# Patient Record
Sex: Male | Born: 1955
Health system: Southern US, Community
[De-identification: ages and names within clinical notes are randomized; demographics above are authoritative.]

## PROBLEM LIST (undated history)

## (undated) DIAGNOSIS — G4730 Sleep apnea, unspecified: Secondary | ICD-10-CM

## (undated) DIAGNOSIS — I10 Essential (primary) hypertension: Secondary | ICD-10-CM

## (undated) DIAGNOSIS — C801 Malignant (primary) neoplasm, unspecified: Secondary | ICD-10-CM

## (undated) DIAGNOSIS — E291 Testicular hypofunction: Secondary | ICD-10-CM

## (undated) DIAGNOSIS — G4733 Obstructive sleep apnea (adult) (pediatric): Secondary | ICD-10-CM

## (undated) DIAGNOSIS — E785 Hyperlipidemia, unspecified: Secondary | ICD-10-CM

## (undated) DIAGNOSIS — F988 Other specified behavioral and emotional disorders with onset usually occurring in childhood and adolescence: Secondary | ICD-10-CM

## (undated) DIAGNOSIS — M5412 Radiculopathy, cervical region: Secondary | ICD-10-CM

## (undated) DIAGNOSIS — R011 Cardiac murmur, unspecified: Secondary | ICD-10-CM

## (undated) DIAGNOSIS — T7840XA Allergy, unspecified, initial encounter: Secondary | ICD-10-CM

## (undated) DIAGNOSIS — M5481 Occipital neuralgia: Secondary | ICD-10-CM

## (undated) DIAGNOSIS — R7302 Impaired glucose tolerance (oral): Secondary | ICD-10-CM

## (undated) HISTORY — DX: Hyperlipidemia, unspecified: E78.5

## (undated) HISTORY — DX: Sleep apnea, unspecified: G47.30

## (undated) HISTORY — DX: Cardiac murmur, unspecified: R01.1

## (undated) HISTORY — DX: Essential (primary) hypertension: I10

## (undated) HISTORY — DX: Allergy, unspecified, initial encounter: T78.40XA

## (undated) HISTORY — DX: Radiculopathy, cervical region: M54.12

## (undated) HISTORY — PX: COLONOSCOPY: SHX174

## (undated) HISTORY — DX: Obstructive sleep apnea (adult) (pediatric): G47.33

## (undated) HISTORY — PX: UVULOPALATOPLASTY: SHX2633

## (undated) HISTORY — PX: POLYPECTOMY: SHX149

## (undated) HISTORY — DX: Other specified behavioral and emotional disorders with onset usually occurring in childhood and adolescence: F98.8

## (undated) HISTORY — DX: Testicular hypofunction: E29.1

## (undated) HISTORY — DX: Impaired glucose tolerance (oral): R73.02

## (undated) HISTORY — DX: Occipital neuralgia: M54.81

## (undated) HISTORY — DX: Malignant (primary) neoplasm, unspecified: C80.1

---

## 1998-03-10 ENCOUNTER — Ambulatory Visit: Admission: RE | Admit: 1998-03-10 | Discharge: 1998-03-10 | Payer: Self-pay | Admitting: Unknown Physician Specialty

## 1998-05-18 ENCOUNTER — Ambulatory Visit (HOSPITAL_BASED_OUTPATIENT_CLINIC_OR_DEPARTMENT_OTHER): Admission: RE | Admit: 1998-05-18 | Discharge: 1998-05-18 | Payer: Self-pay | Admitting: Otolaryngology

## 2004-07-04 ENCOUNTER — Ambulatory Visit: Payer: Self-pay | Admitting: Internal Medicine

## 2004-07-11 ENCOUNTER — Ambulatory Visit: Payer: Self-pay | Admitting: Internal Medicine

## 2004-08-03 ENCOUNTER — Ambulatory Visit: Payer: Self-pay | Admitting: Gastroenterology

## 2005-05-22 DIAGNOSIS — M5412 Radiculopathy, cervical region: Secondary | ICD-10-CM

## 2005-05-22 HISTORY — DX: Radiculopathy, cervical region: M54.12

## 2005-10-10 ENCOUNTER — Ambulatory Visit: Payer: Self-pay | Admitting: Internal Medicine

## 2006-03-12 ENCOUNTER — Ambulatory Visit (HOSPITAL_COMMUNITY): Admission: RE | Admit: 2006-03-12 | Discharge: 2006-03-12 | Payer: Self-pay | Admitting: Internal Medicine

## 2006-03-12 ENCOUNTER — Ambulatory Visit: Payer: Self-pay | Admitting: Internal Medicine

## 2006-06-08 ENCOUNTER — Ambulatory Visit: Payer: Self-pay | Admitting: Internal Medicine

## 2006-06-25 ENCOUNTER — Encounter: Admission: RE | Admit: 2006-06-25 | Discharge: 2006-06-25 | Payer: Self-pay | Admitting: Internal Medicine

## 2006-12-14 ENCOUNTER — Ambulatory Visit: Payer: Self-pay | Admitting: Internal Medicine

## 2007-04-08 ENCOUNTER — Telehealth (INDEPENDENT_AMBULATORY_CARE_PROVIDER_SITE_OTHER): Payer: Self-pay | Admitting: *Deleted

## 2007-04-08 ENCOUNTER — Ambulatory Visit: Payer: Self-pay | Admitting: Internal Medicine

## 2007-04-08 DIAGNOSIS — J309 Allergic rhinitis, unspecified: Secondary | ICD-10-CM | POA: Insufficient documentation

## 2007-04-08 DIAGNOSIS — E291 Testicular hypofunction: Secondary | ICD-10-CM | POA: Insufficient documentation

## 2007-04-08 DIAGNOSIS — E785 Hyperlipidemia, unspecified: Secondary | ICD-10-CM | POA: Insufficient documentation

## 2007-04-08 DIAGNOSIS — J019 Acute sinusitis, unspecified: Secondary | ICD-10-CM | POA: Insufficient documentation

## 2007-04-08 DIAGNOSIS — E739 Lactose intolerance, unspecified: Secondary | ICD-10-CM | POA: Insufficient documentation

## 2007-04-08 DIAGNOSIS — F988 Other specified behavioral and emotional disorders with onset usually occurring in childhood and adolescence: Secondary | ICD-10-CM | POA: Insufficient documentation

## 2007-04-10 ENCOUNTER — Ambulatory Visit: Payer: Self-pay | Admitting: Internal Medicine

## 2007-04-10 ENCOUNTER — Encounter (INDEPENDENT_AMBULATORY_CARE_PROVIDER_SITE_OTHER): Payer: Self-pay | Admitting: *Deleted

## 2007-04-10 DIAGNOSIS — M25529 Pain in unspecified elbow: Secondary | ICD-10-CM | POA: Insufficient documentation

## 2007-04-10 DIAGNOSIS — D485 Neoplasm of uncertain behavior of skin: Secondary | ICD-10-CM | POA: Insufficient documentation

## 2007-11-21 ENCOUNTER — Ambulatory Visit: Payer: Self-pay | Admitting: Endocrinology

## 2007-11-21 DIAGNOSIS — K1370 Unspecified lesions of oral mucosa: Secondary | ICD-10-CM | POA: Insufficient documentation

## 2007-12-02 ENCOUNTER — Ambulatory Visit: Payer: Self-pay | Admitting: Endocrinology

## 2007-12-02 DIAGNOSIS — R634 Abnormal weight loss: Secondary | ICD-10-CM | POA: Insufficient documentation

## 2007-12-02 LAB — CONVERTED CEMR LAB
AST: 16 units/L (ref 0–37)
Albumin: 4.4 g/dL (ref 3.5–5.2)
Alkaline Phosphatase: 64 units/L (ref 39–117)
Basophils Absolute: 0.1 10*3/uL (ref 0.0–0.1)
Chloride: 102 meq/L (ref 96–112)
Eosinophils Absolute: 0 10*3/uL (ref 0.0–0.7)
GFR calc Af Amer: 113 mL/min
GFR calc non Af Amer: 93 mL/min
HCT: 45.8 % (ref 39.0–52.0)
MCHC: 33.7 g/dL (ref 30.0–36.0)
MCV: 95 fL (ref 78.0–100.0)
Monocytes Absolute: 0.9 10*3/uL (ref 0.1–1.0)
Neutrophils Relative %: 74.9 % (ref 43.0–77.0)
Platelets: 502 10*3/uL — ABNORMAL HIGH (ref 150–400)
Potassium: 4.8 meq/L (ref 3.5–5.1)
RDW: 12.5 % (ref 11.5–14.6)
TSH: 0.28 microintl units/mL — ABNORMAL LOW (ref 0.35–5.50)
Total Bilirubin: 1.4 mg/dL — ABNORMAL HIGH (ref 0.3–1.2)

## 2007-12-10 ENCOUNTER — Ambulatory Visit: Payer: Self-pay | Admitting: Cardiology

## 2007-12-13 ENCOUNTER — Telehealth (INDEPENDENT_AMBULATORY_CARE_PROVIDER_SITE_OTHER): Payer: Self-pay | Admitting: *Deleted

## 2007-12-16 ENCOUNTER — Encounter: Payer: Self-pay | Admitting: Gastroenterology

## 2007-12-16 DIAGNOSIS — Q445 Other congenital malformations of bile ducts: Secondary | ICD-10-CM

## 2007-12-16 DIAGNOSIS — Q4479 Other congenital malformations of liver: Secondary | ICD-10-CM | POA: Insufficient documentation

## 2007-12-16 DIAGNOSIS — Q447 Other congenital malformations of liver: Secondary | ICD-10-CM

## 2007-12-16 DIAGNOSIS — Q441 Other congenital malformations of gallbladder: Secondary | ICD-10-CM | POA: Insufficient documentation

## 2007-12-26 ENCOUNTER — Ambulatory Visit (HOSPITAL_COMMUNITY): Admission: RE | Admit: 2007-12-26 | Discharge: 2007-12-26 | Payer: Self-pay | Admitting: Gastroenterology

## 2007-12-26 ENCOUNTER — Encounter: Payer: Self-pay | Admitting: Gastroenterology

## 2008-01-01 ENCOUNTER — Ambulatory Visit: Payer: Self-pay | Admitting: Gastroenterology

## 2009-05-19 ENCOUNTER — Telehealth: Payer: Self-pay | Admitting: Internal Medicine

## 2009-05-19 ENCOUNTER — Ambulatory Visit: Payer: Self-pay | Admitting: Internal Medicine

## 2009-05-19 DIAGNOSIS — R209 Unspecified disturbances of skin sensation: Secondary | ICD-10-CM | POA: Insufficient documentation

## 2009-05-19 DIAGNOSIS — L723 Sebaceous cyst: Secondary | ICD-10-CM | POA: Insufficient documentation

## 2009-05-19 DIAGNOSIS — Z720 Tobacco use: Secondary | ICD-10-CM | POA: Insufficient documentation

## 2009-05-19 DIAGNOSIS — M542 Cervicalgia: Secondary | ICD-10-CM | POA: Insufficient documentation

## 2009-05-20 ENCOUNTER — Encounter: Payer: Self-pay | Admitting: Internal Medicine

## 2009-05-22 DIAGNOSIS — M5481 Occipital neuralgia: Secondary | ICD-10-CM

## 2009-05-22 HISTORY — DX: Occipital neuralgia: M54.81

## 2009-06-15 ENCOUNTER — Ambulatory Visit: Payer: Self-pay | Admitting: Internal Medicine

## 2009-06-15 LAB — CONVERTED CEMR LAB
ALT: 31 units/L (ref 0–53)
AST: 25 units/L (ref 0–37)
Alkaline Phosphatase: 57 units/L (ref 39–117)
BUN: 17 mg/dL (ref 6–23)
Basophils Relative: 2.6 % (ref 0.0–3.0)
Chloride: 104 meq/L (ref 96–112)
Cholesterol: 226 mg/dL — ABNORMAL HIGH (ref 0–200)
Direct LDL: 134.9 mg/dL
Eosinophils Absolute: 0.1 10*3/uL (ref 0.0–0.7)
Eosinophils Relative: 1.2 % (ref 0.0–5.0)
GFR calc non Af Amer: 106.38 mL/min (ref >60)
Glucose, Bld: 95 mg/dL (ref 70–99)
Hemoglobin: 15 g/dL (ref 13.0–17.0)
Ketones, ur: NEGATIVE mg/dL
Lymphocytes Relative: 17.4 % (ref 12.0–46.0)
MCHC: 32.3 g/dL (ref 30.0–36.0)
Monocytes Relative: 7.3 % (ref 3.0–12.0)
Neutro Abs: 7.3 10*3/uL (ref 1.4–7.7)
Neutrophils Relative %: 71.5 % (ref 43.0–77.0)
PSA: 0.7 ng/mL (ref 0.10–4.00)
Potassium: 4.1 meq/L (ref 3.5–5.1)
RBC: 4.69 M/uL (ref 4.22–5.81)
Sodium: 141 meq/L (ref 135–145)
Specific Gravity, Urine: 1.01 (ref 1.000–1.030)
Testosterone: 235.46 ng/dL — ABNORMAL LOW (ref 350.00–890.00)
Total Bilirubin: 0.9 mg/dL (ref 0.3–1.2)
Total CHOL/HDL Ratio: 3
Total Protein, Urine: NEGATIVE mg/dL
Urine Glucose: NEGATIVE mg/dL
Urobilinogen, UA: 0.2 (ref 0.0–1.0)
VLDL: 25.8 mg/dL (ref 0.0–40.0)
WBC: 10.3 10*3/uL (ref 4.5–10.5)
pH: 6 (ref 5.0–8.0)

## 2009-06-17 ENCOUNTER — Ambulatory Visit: Payer: Self-pay | Admitting: Internal Medicine

## 2009-07-18 IMAGING — CT CT ABDOMEN W/ CM
3 of 5 series · 16 of 46 positions shown, 18 images · IV contrast (Omnipaque 300)
Comparison: None Available

CT ABDOMEN

CLINICAL DATA: 52-year-old male with history 20 pounds weight loss
and loss of appetite.

CT ABDOMEN AND PELVIS WITH CONTRAST
TECHNIQUE: Multidetector CT imaging of the abdomen and pelvis was
performed using the standard protocol following bolus
administration of intravenous contrast.
Contrast: 100 ml Omni 300

[Series 2: abd_pel 5.0 b30f st · axial · 0.78mm/px · z∈[-458,-53]mm · 11 of 99 slices shown, 13 images]
[im 9/99  soft-tissue]
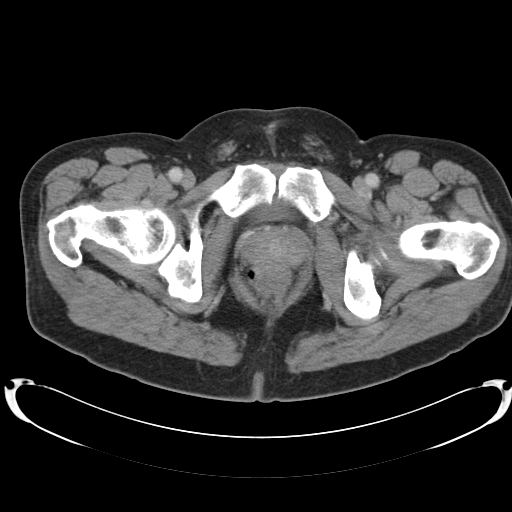
[im 9/99  bone]
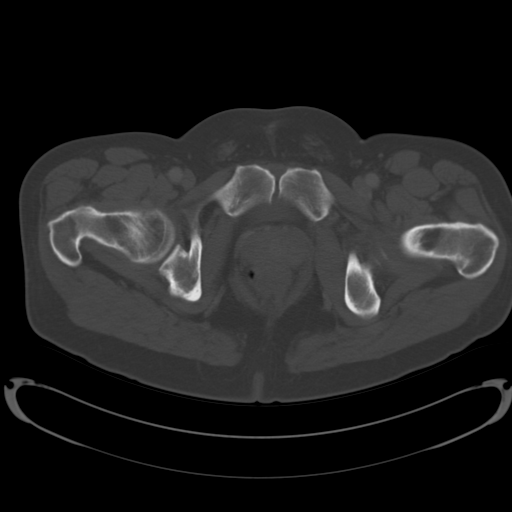
[im 17/99  soft-tissue]
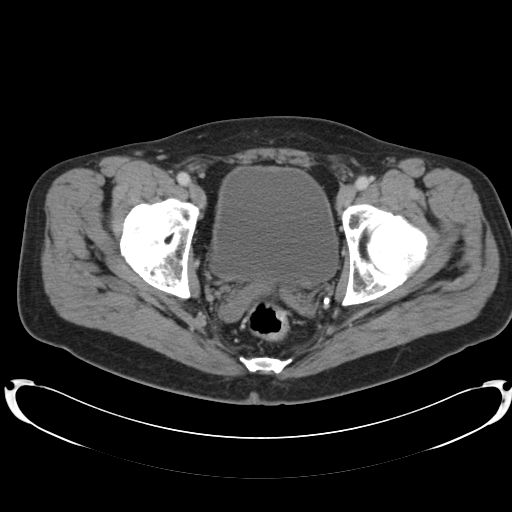
[im 25/99  soft-tissue]
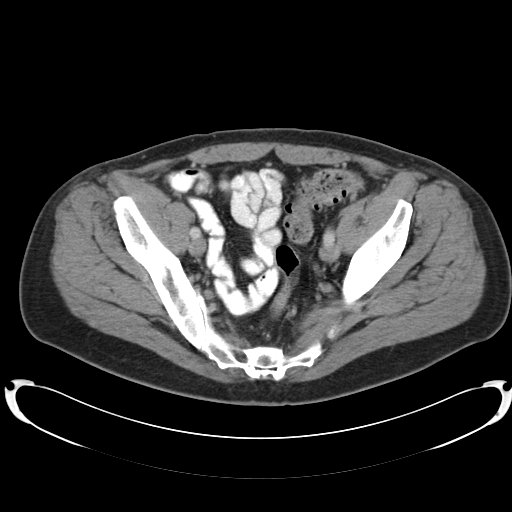
[im 33/99  soft-tissue]
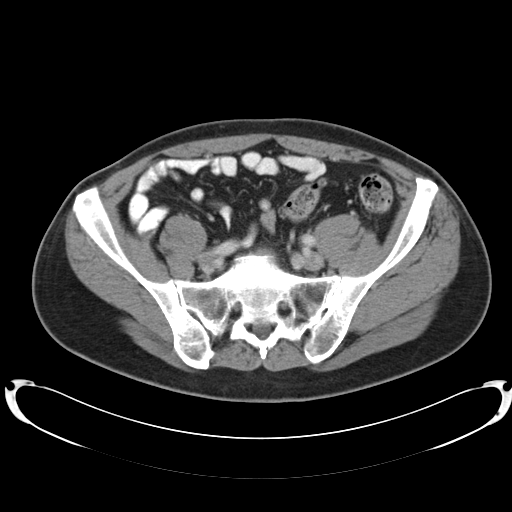
[im 41/99  soft-tissue]
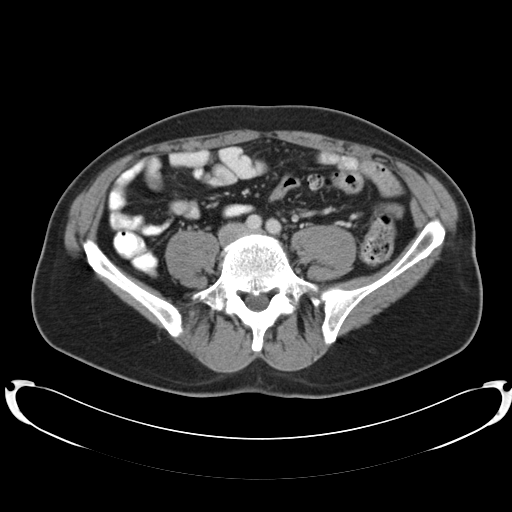
[im 50/99  soft-tissue]
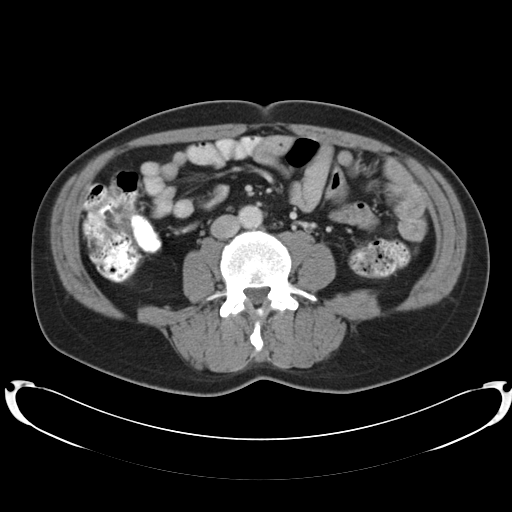
[im 58/99  soft-tissue]
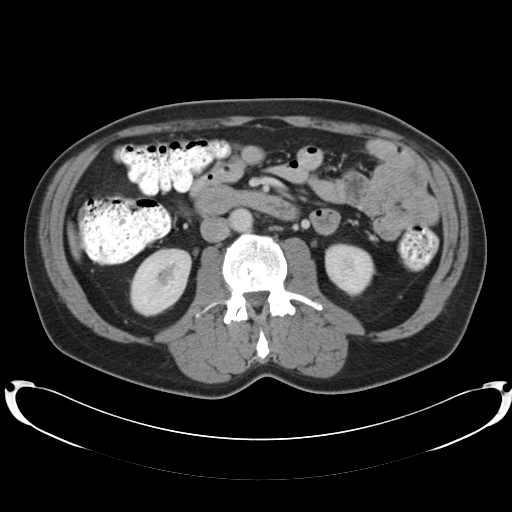
[im 66/99  soft-tissue]
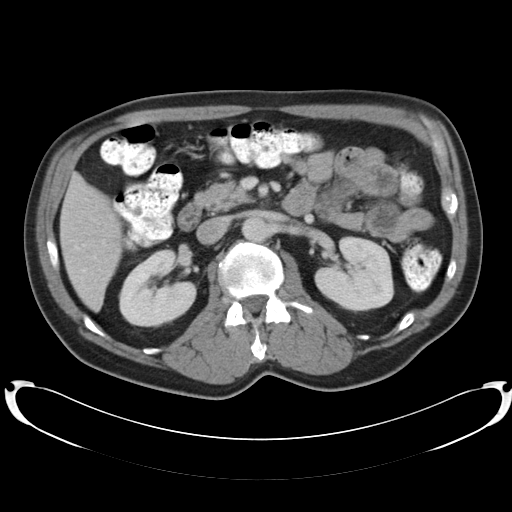
[im 74/99  soft-tissue]
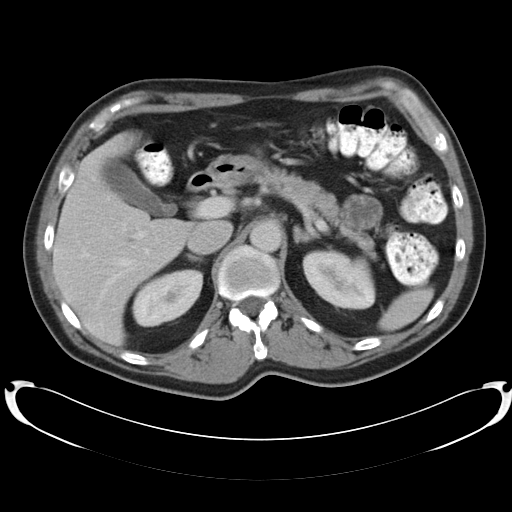
[im 74/99  bone]
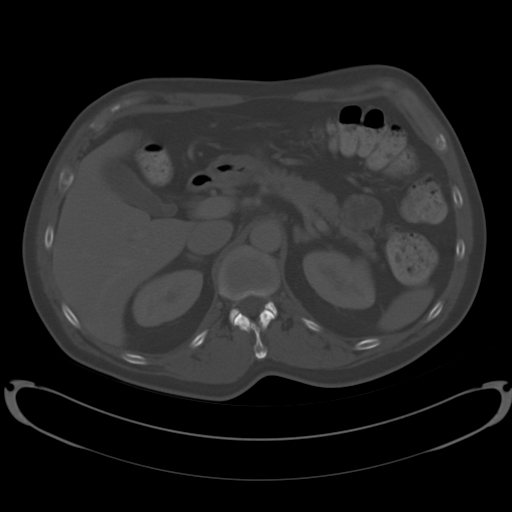
[im 82/99  soft-tissue]
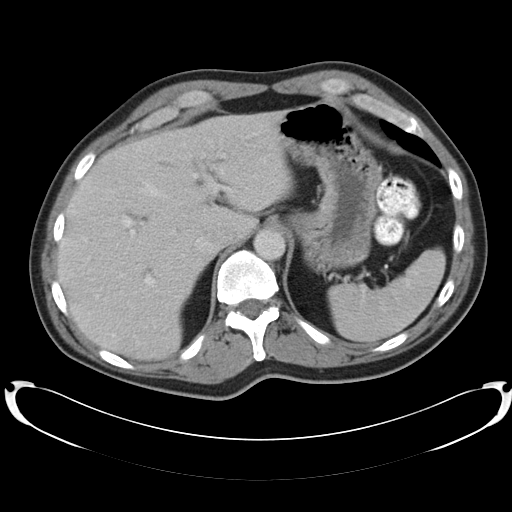
[im 90/99  soft-tissue]
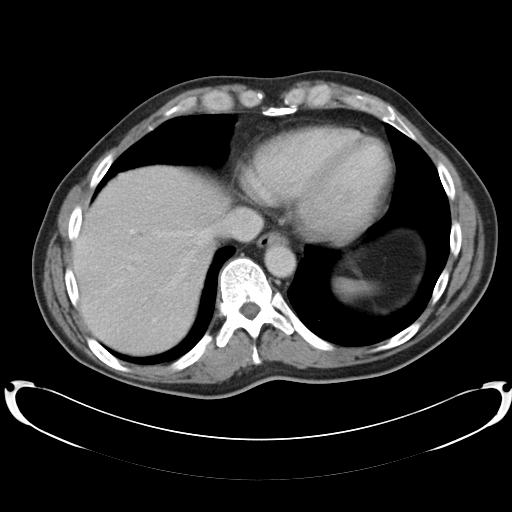

[Series 3: abd_pel 5.0 b60f lung · axial · 0.78mm/px · z∈[-448,-368]mm · 2 of 96 slices shown]
[im 8/96  bone]
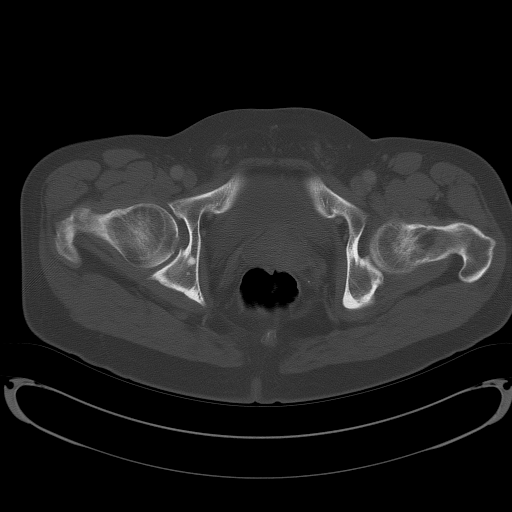
[im 24/96  bone]
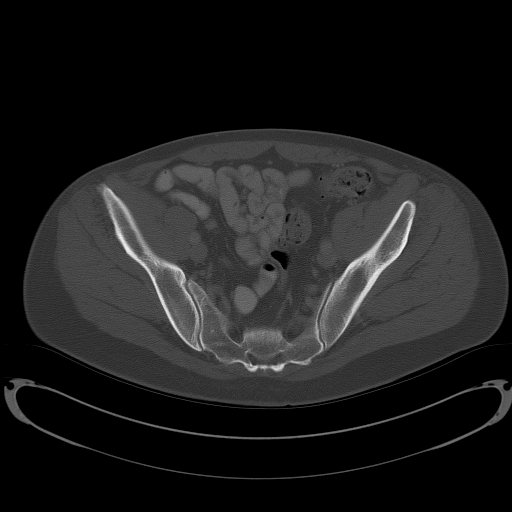

[Series 602: <mpr thick range> · coronal · 0.97mm/px · 3 of 87 slices shown]
[im 29/87  soft-tissue]
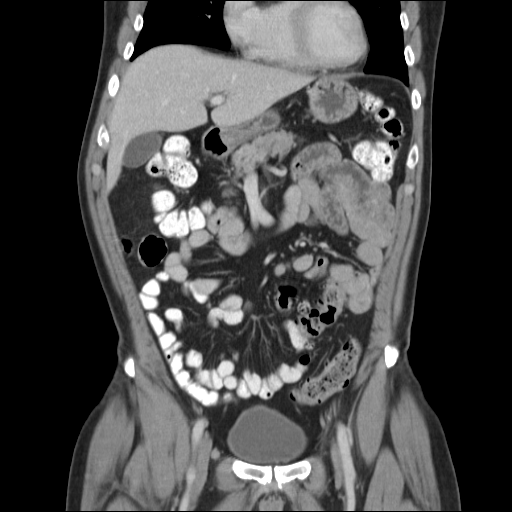
[im 39/87  soft-tissue]
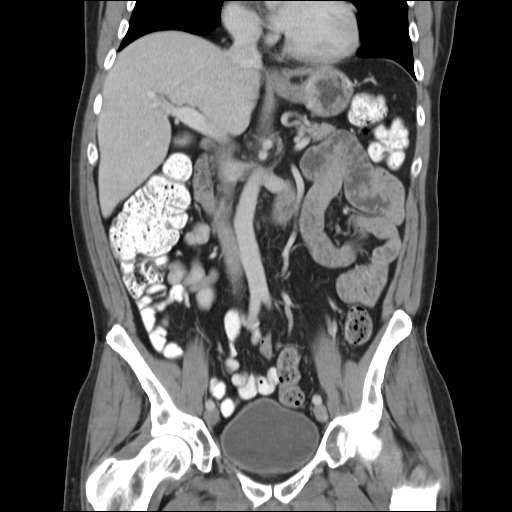
[im 48/87  soft-tissue]
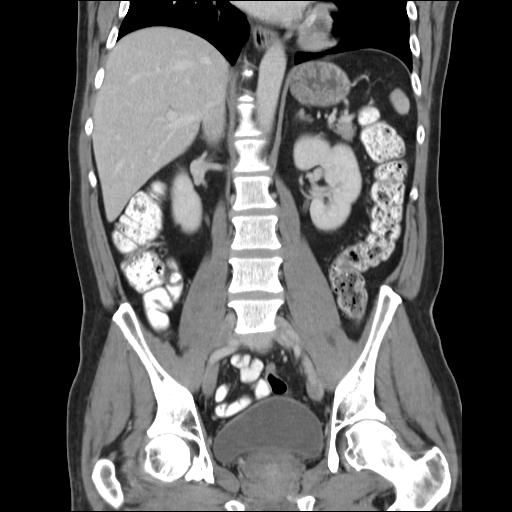

[16 of 46 positions shown; findings below may reference images not displayed]

FINDINGS: Lung bases are clear. Heart is normal.

7 mm hypodensity within the right hepatic lobe just superior to the
gallbladder fossa on image 24 too small to characterize. There is
fatty infiltration along the falciform ligament. There is a very
mild intrahepatic biliary ductal dilatation. Gallbladder appears
normal. The common bile duct is at upper limits of normal of 6 mm
through the pancreatic head. The pancreatic duct is visible but
is not significantly dilated (2-3 mm) . No evidence of pancreatic
atrophy. The spleen, adrenal glands, and kidneys appear normal.

The stomach, duodenum, small bowel, appendix, and colon appear
normal.

Abdominal aorta is normal in caliber. Small 6 mm periaortic lymph
nodes (image 28) not pathologic by CT size criteria. No evidence
of periportal lymphadenopathy.
IMPRESSION: . No acute abdominal process identified.
2.. Mild intrahepatic biliary ductal dilatation without clear
etiology. The common bile duct and pancreatic ducts are at the
upper limits of normal. In patient with significant weight loss
weight loss , consider contrast MRI of the abdomen with MRCP for
further evaluation the distal common bile duct.

CT PELVIS
FINDINGS: No free fluid in the pelvis. The bladder, prostate,
seminal vesicles appear normal. The rectum appears normal. There
is scattered diverticula of the sigmoid colon without acute
inflammation. The 7 mm left common iliac node (image 57) which is
not pathologic by CT size criteria.

Review bone windows demonstrates no aggressive osseous lesions.
IMPRESSION: 1. No acute of pelvic process.

## 2009-10-06 ENCOUNTER — Ambulatory Visit: Payer: Self-pay | Admitting: Internal Medicine

## 2009-10-06 DIAGNOSIS — R198 Other specified symptoms and signs involving the digestive system and abdomen: Secondary | ICD-10-CM | POA: Insufficient documentation

## 2009-10-07 LAB — CONVERTED CEMR LAB
BUN: 20 mg/dL (ref 6–23)
Basophils Absolute: 0 10*3/uL (ref 0.0–0.1)
Chloride: 102 meq/L (ref 96–112)
Eosinophils Relative: 0.4 % (ref 0.0–5.0)
GFR calc non Af Amer: 96.46 mL/min (ref >60)
Hemoglobin: 15.7 g/dL (ref 13.0–17.0)
Lymphocytes Relative: 18.5 % (ref 12.0–46.0)
Monocytes Relative: 8 % (ref 3.0–12.0)
Neutro Abs: 6.9 10*3/uL (ref 1.4–7.7)
Platelets: 269 10*3/uL (ref 150.0–400.0)
Potassium: 4.2 meq/L (ref 3.5–5.1)
RDW: 13.1 % (ref 11.5–14.6)
TSH: 0.62 microintl units/mL (ref 0.35–5.50)
WBC: 9.5 10*3/uL (ref 4.5–10.5)

## 2009-10-19 ENCOUNTER — Encounter (INDEPENDENT_AMBULATORY_CARE_PROVIDER_SITE_OTHER): Payer: Self-pay | Admitting: *Deleted

## 2009-10-25 ENCOUNTER — Ambulatory Visit: Payer: Self-pay | Admitting: Internal Medicine

## 2009-10-25 DIAGNOSIS — M25519 Pain in unspecified shoulder: Secondary | ICD-10-CM | POA: Insufficient documentation

## 2009-10-29 ENCOUNTER — Telehealth: Payer: Self-pay | Admitting: Internal Medicine

## 2009-11-01 ENCOUNTER — Encounter (INDEPENDENT_AMBULATORY_CARE_PROVIDER_SITE_OTHER): Payer: Self-pay | Admitting: *Deleted

## 2009-11-03 ENCOUNTER — Ambulatory Visit: Payer: Self-pay | Admitting: Gastroenterology

## 2009-11-17 ENCOUNTER — Ambulatory Visit: Payer: Self-pay | Admitting: Gastroenterology

## 2009-11-19 ENCOUNTER — Encounter: Payer: Self-pay | Admitting: Gastroenterology

## 2009-12-15 ENCOUNTER — Ambulatory Visit: Payer: Self-pay | Admitting: Internal Medicine

## 2009-12-16 ENCOUNTER — Telehealth: Payer: Self-pay | Admitting: Internal Medicine

## 2009-12-16 LAB — CONVERTED CEMR LAB
ALT: 24 units/L (ref 0–53)
Albumin: 4.3 g/dL (ref 3.5–5.2)
Basophils Relative: 0.6 % (ref 0.0–3.0)
CO2: 29 meq/L (ref 19–32)
Chloride: 105 meq/L (ref 96–112)
Eosinophils Relative: 1 % (ref 0.0–5.0)
HCT: 42 % (ref 39.0–52.0)
Lymphs Abs: 2.1 10*3/uL (ref 0.7–4.0)
MCV: 96 fL (ref 78.0–100.0)
Monocytes Absolute: 1.1 10*3/uL — ABNORMAL HIGH (ref 0.1–1.0)
Neutro Abs: 6.7 10*3/uL (ref 1.4–7.7)
Potassium: 4.5 meq/L (ref 3.5–5.1)
RBC: 4.38 M/uL (ref 4.22–5.81)
TSH: 0.53 microintl units/mL (ref 0.35–5.50)
Testosterone: 180.5 ng/dL — ABNORMAL LOW (ref 350.00–890.00)
Total Protein: 6.8 g/dL (ref 6.0–8.3)
Vitamin B-12: 517 pg/mL (ref 211–911)
WBC: 10.1 10*3/uL (ref 4.5–10.5)

## 2010-03-30 ENCOUNTER — Ambulatory Visit: Payer: Self-pay | Admitting: Internal Medicine

## 2010-03-30 DIAGNOSIS — R51 Headache: Secondary | ICD-10-CM

## 2010-03-30 DIAGNOSIS — H6690 Otitis media, unspecified, unspecified ear: Secondary | ICD-10-CM | POA: Insufficient documentation

## 2010-03-30 DIAGNOSIS — R519 Headache, unspecified: Secondary | ICD-10-CM | POA: Insufficient documentation

## 2010-04-05 ENCOUNTER — Ambulatory Visit (HOSPITAL_COMMUNITY): Admission: RE | Admit: 2010-04-05 | Discharge: 2010-04-05 | Payer: Self-pay | Admitting: Internal Medicine

## 2010-04-20 ENCOUNTER — Ambulatory Visit: Payer: Self-pay | Admitting: Internal Medicine

## 2010-04-20 DIAGNOSIS — M531 Cervicobrachial syndrome: Secondary | ICD-10-CM | POA: Insufficient documentation

## 2010-04-20 DIAGNOSIS — M5481 Occipital neuralgia: Secondary | ICD-10-CM | POA: Insufficient documentation

## 2010-04-25 ENCOUNTER — Telehealth: Payer: Self-pay | Admitting: Internal Medicine

## 2010-06-16 ENCOUNTER — Telehealth (INDEPENDENT_AMBULATORY_CARE_PROVIDER_SITE_OTHER): Payer: Self-pay | Admitting: *Deleted

## 2010-06-18 ENCOUNTER — Encounter: Payer: Self-pay | Admitting: Internal Medicine

## 2010-06-21 NOTE — Assessment & Plan Note (Signed)
Summary: 1 mos f/u //cd   Vital Signs:  Patient profile:   55 year old male Weight:      233 pounds Temp:     98.1 degrees F oral Pulse rate:   70 / minute BP sitting:   130 / 70  (left arm)  Vitals Entered By: Tora Perches (June 17, 2009 9:09 AM) CC: f/u Is Patient Diabetic? No   CC:  f/u.  History of Present Illness: F/u neck pain (resolved) and R arm tingling - 50% bettter. No pain  now, just tingling, not weak.  Preventive Screening-Counseling & Management  Alcohol-Tobacco     Smoking Status: quit  Current Medications (verified): 1)  Prednisone 10 Mg Tabs (Prednisone) .... Take 40mg  Qd For 3 Days, Then 20 Mg Qd For 3 Days, Then 10mg  Qd For 6 Days, Then Stop. Take Pc. 2)  Androgel Pump 1 % Gel (Testosterone) .Marland Kitchen.. 10 G On Skin Qam 3)  Vitamin D 1000 Unit Tabs (Cholecalciferol)  Allergies (verified): No Known Drug Allergies  Past History:  Past Medical History: Last updated: 05/19/2009 Asthma ADD Hyperlipidemia Hypogonadism Allergic rhinitis glucose intolerance OSA R cervical radiculopathy Dr Gerlene Fee 2007  Social History: Former Smoker Alcohol use-yes Single  Physical Exam  General:  well developed, well nourished, in no acute distress Nose:  External nasal examination shows no deformity or inflammation. Nasal mucosa are pink and moist without lesions or exudates. Mouth:  centimeter diameter shallow ulcer at the left oral pharynx Lungs:  CTA Heart:  RRR Abdomen:  S/NT Msk:  R grip and R arm muscle strength 5-/5 Extremities:  no edema Neurologic:  No cranial nerve deficits noted. Station and gait are normal. Plantar reflexes are down-going bilaterally. DTRs are symmetrical throughout. Sensory, motor and coordinative functions appear intact. Skin:  normal texture and temp. 1cm seb cyst on R upper back, NT Psych:  alert and cooperative; normal mood and affect; normal attention span and concentration   Impression & Recommendations:  Problem # 1:   NECK PAIN (ICD-723.1) Assessment Improved  His updated medication list for this problem includes:    Nabumetone 500 Mg Tabs (Nabumetone) .Marland Kitchen... 1 by mouth two times a day x 1 month then prn  Problem # 2:  PARESTHESIA (ICD-782.0) Assessment: Improved Dr Gerlene Fee if not better  Problem # 3:  HYPOGONADISM, MALE (ICD-257.2) Assessment: Unchanged On prescription therapy   Complete Medication List: 1)  Prednisone 10 Mg Tabs (Prednisone) .... Take 40mg  qd for 3 days, then 20 mg qd for 3 days, then 10mg  qd for 6 days, then stop. take pc. 2)  Androgel Pump 1 % Gel (Testosterone) .Marland Kitchen.. 10 g on skin qam 3)  Vitamin D 1000 Unit Tabs (Cholecalciferol) 4)  Nabumetone 500 Mg Tabs (Nabumetone) .Marland Kitchen.. 1 by mouth two times a day x 1 month then prn  Patient Instructions: 1)  Call if you are not better in a reasonable amount of time or if worse.  2)  BMP prior to visit, ICD-9: 3)  Hepatic Panel prior to visit, ICD-9: 4)  Lipid Panel prior to visit, ICD-9: 5)  TSH prior to visit, ICD-9: 6)  CBC w/ Diff prior to visit, ICD-9: 7)  testost 780.79  782.0 8)  Please schedule a follow-up appointment in 6 months. Prescriptions: NABUMETONE 500 MG TABS (NABUMETONE) 1 by mouth two times a day x 1 month then prn  #60 x 3   Entered and Authorized by:   Tresa Garter MD   Signed by:  Tresa Garter MD on 06/17/2009   Method used:   Electronically to        [facility] [address] [phone]* (retail)       [address]       [place], [place]  [address]       Ph: [phone] or [phone]       Fax: [phone]   RxID:   [id number]

## 2010-06-21 NOTE — Miscellaneous (Signed)
Summary: direct colon--ch.  Clinical Lists Changes  Medications: Added new medication of MOVIPREP 100 GM  SOLR (PEG-KCL-NACL-NASULF-NA ASC-C) As directed - Signed Rx of MOVIPREP 100 GM  SOLR (PEG-KCL-NACL-NASULF-NA ASC-C) As directed;  #1 x 0;  Signed;  Entered by: Clide Cliff RN;  Authorized by: Rachael Fee MD;  Method used: [facility]  [address] [phone]*, [address], [place], [place]  [address], Ph: [phone] or [phone], Fax: [phone] Observations: Added new observation of ALLERGY REV: Done (11/03/2009 16:00)    Prescriptions: MOVIPREP 100 GM  SOLR (PEG-KCL-NACL-NASULF-NA ASC-C) As directed  #1 x 0   Entered by:   Clide Cliff RN   Authorized by:   Rachael Fee MD   Signed by:   Clide Cliff RN on 11/03/2009   Method used:   [facility]ronically [facility]        [facility] [address] [phone]* (retail)       [address]       [place], [place]  [address]       Ph: [phone] or [phone]       Fax: [phone]   RxID:   [id number]

## 2010-06-21 NOTE — Procedures (Signed)
Summary: Colonoscopy  Patient: Victor Montes Note: All result statuses are Final unless otherwise noted.  Tests: (1) Colonoscopy (COL)   COL Colonoscopy           DONE     [facility]     [address]     [place], [place]  [address]           COLONOSCOPY PROCEDURE REPORT           PATIENT:  Victor Montes, Victor Montes  MR#:  [id number]     BIRTHDATE:  09-12-1955, 54 yrs. old  GENDER:  male     ENDOSCOPIST:  Rachael Fee, MD     REF. BY:  Linda Hedges. Plotnikov, M.D.     PROCEDURE DATE:  11/17/2009     PROCEDURE:  Colonoscopy with snare polypectomy     ASA CLASS:  Class II     INDICATIONS:  Elevated Risk Screening, mother had colon cancer     MEDICATIONS:   Fentanyl 75 mcg IV, Versed 8 mg IV           DESCRIPTION OF PROCEDURE:   After the risks benefits and     alternatives of the procedure were thoroughly explained, informed     consent was obtained.  Digital rectal exam was performed and     revealed no rectal masses.   The LB [id number] endoscope     was introduced through the anus and advanced to the cecum, which     was identified by both the appendix and ileocecal valve, without     limitations.  The quality of the prep was good, using MoviPrep.     The instrument was then slowly withdrawn as the colon was fully     examined.     <<PROCEDUREIMAGES>>           FINDINGS:  Three small sessile polyps were found, all were removed     with cold snare and sent to pathology (jar 1). These were 3-53mm     across, located in transverse, descending and sigmoid segments     (see image2 and image6).  Mild diverticulosis was found throughout     the colon (see image1).  This was otherwise a normal examination     of the colon (see image3, image5, and image7).   Retroflexed views     in the rectum revealed no abnormalities.    The scope was then     withdrawn from the patient and the procedure completed.           COMPLICATIONS:  None     ENDOSCOPIC IMPRESSION:     1) Three  small colon polyps, all removed and sent to pathology     2) Mild diverticulosis throughout the colon     3) Otherwise normal examination           RECOMMENDATIONS:     1) If the polyp(s) removed today are proven to be adenomatous     (pre-cancerous) polyps, you will need a colonoscopy in 3-5 years.     Otherwise given your significant family history of colon cancer,     you will at least need a colonoscopy in 5 years.     2) You will receive a letter within 1-2 weeks with the results     of your biopsy as well as final recommendations. Please call my     office if you have not received a letter after 3 weeks.  ______________________________     Rachael Fee, MD           n.     eSIGNED:   Rachael Fee at 11/17/2009 08:51 AM           [name], [id number]  Note: An exclamation Otoniel (!) indicates a result that was not dispersed into the flowsheet. Document Creation Date: 11/17/2009 8:51 AM _______________________________________________________________________  (1) Order result status: Final Collection or observation date-time: 11/17/2009 08:44 Requested date-time:  Receipt date-time:  Reported date-time:  Referring Physician:   Ordering Physician: Rob Bunting [phone]) Specimen Source:  Source: Victor Montes Order Number: [id number] Lab site:   Appended Document: Colonoscopy recall     Procedures Next Due Date:    Colonoscopy: 10/2014

## 2010-06-21 NOTE — Miscellaneous (Signed)
Summary: [facility]  [facility]   Imported By: Sherian Rein 04/22/2010 14:18:18  _____________________________________________________________________  External Attachment:    Type:   Image     Comment:   External Document

## 2010-06-21 NOTE — Letter (Signed)
Summary: Previsit letter  [facility]  [address] [place], [place] [address]   Phone: [phone]  Fax: [phone]       10/19/2009 MRN: [id number]  [facility] [address] [place], [place]  [address]  Dear Victor Montes,  Welcome to the Gastroenterology Division at [facility].    You are scheduled to see a nurse for your pre-procedure visit on 11-03-09 at 4:00p.m. on the 3rd floor at [facility], [address]  We ask that you try to arrive at our office 15 minutes prior to your appointment time to allow for check-in.  Your nurse visit will consist of discussing your medical and surgical history, your immediate family medical history, and your medications.    Please bring a complete list of all your medications or, if you prefer, bring the medication bottles and we will list them.  We will need to be aware of both prescribed and over the counter drugs.  We will need to know exact dosage information as well.  If you are on blood thinners (Coumadin, Plavix, Aggrenox, Ticlid, etc.) please call our office today/prior to your appointment, as we need to consult with your physician about holding your medication.   Please be prepared to read and sign documents such as consent forms, a financial agreement, and acknowledgement forms.  If necessary, and with your consent, a friend or relative is welcome to sit-in on the nurse visit with you.  Please bring your insurance card so that we may make a copy of it.  If your insurance requires a referral to see a specialist, please bring your referral form from your primary care physician.  No co-pay is required for this nurse visit.     If you cannot keep your appointment, please call [phone] to cancel or reschedule prior to your appointment date.  This allows [place] the opportunity to schedule an appointment for another patient in need of care.    Thank you for choosing [facility] for your medical  needs.  We appreciate the opportunity to care for you.  Please visit [place] at our website  to learn more about our practice.                     Sincerely.                                                                                                                   The Gastroenterology Division

## 2010-06-21 NOTE — Assessment & Plan Note (Signed)
Summary: HEADACHE/PLOT/CD   Vital Signs:  Patient profile:   55 year old male Height:      78 inches Weight:      242.50 pounds BMI:     28.12 O2 Sat:      96 % on Room air Temp:     98.4 degrees F oral Pulse rate:   59 / minute BP sitting:   130 / 62  (left arm) Cuff size:   large  Vitals Entered By: Zella Ball Ewing CMA (AAMA) (March 30, 2010 3:09 PM)  O2 Flow:  Room air CC: Headaches for 12 days/RE   CC:  Headaches for 12 days/RE.  History of Present Illness: here with acute c/o mod to severe 12 days onset always left sided headache , multiple episodes per day over 10 lasting up to 10 to 20 minutes each;  worse to move the head even slightly left and right when the pain occurs, located primarily left occiput adn parietotemporal ;  nontender to touch,  no blurred vision, dizziness, syncope, n/v, photophobia; no recent trauma, falls, injury.  No obvoius inciting factors or trigger, seems random, no prior similar pain,  2 alleve in the am no help, no rash, swelling,  no overt ENT issue such as ear pain, presure, vertigo, ST, sinus pain.  Mucinex daily no help.  Has hx of right neck to right shoudler pain with known c-spine DJD and mild disc dz better with predpack in the past, but this type of pain is different.  Pt denies CP, worsening sob, doe, wheezing, orthopnea, pnd, worsening LE edema, palps, dizziness or syncope  Pt denies new neuro symptoms such as facial or extremity weakness No fever, wt loss, night sweats, loss of appetite or other constitutional symptoms   Preventive Screening-Counseling & Management      Drug Use:  no.    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) 10)  Weight Loss  (ICD-783.21) 11)  Oral Ulcer  (ICD-528.9) 12)  Neoplasm of Uncertain Behavior of Skin  (ICD-238.2) 13)  Elbow Pain  (ICD-719.42) 14)  Sinusitis- Acute-nos  (ICD-461.9) 15)  Glucose Intolerance  (ICD-271.3) 16)  Allergic Rhinitis  (ICD-477.9) 17)  Hypogonadism, Male  (ICD-257.2) 18)  Hyperlipidemia  (ICD-272.4) 19)  Add  (ICD-314.00) 20)  Asthma  (ICD-493.90) 21)  Family History of Cad Male 1st Degree Relative <50  (ICD-V17.3) 22)  Family History of Cad Male 1st Degree Relative <60  (ICD-V16.49)  Medications Prior to Update: 1)  Vitamin D 1000 Unit Tabs (Cholecalciferol) 2)  Metamucil 0.52 Gm Caps (Psyllium) .... 2 Caps Once Daily - Two Times A Day 3)  Pennsaid 1.5 % Soln (Diclofenac Sodium) .... 3-5 Gtt On Skin Three Times A Day For Pain 4)  Hydrocodone-Acetaminophen 10-325 Mg Tabs (Hydrocodone-Acetaminophen) .Marland Kitchen.. 1 By Mouth Two Times A Day By Mouth Bid 5)  Androgel Pump 1.6 % Gel (Testosterone) .... 2 Pumps On Skin Qam 6)  Lactulose 20 Gm/8ml Soln (Lactulose) .... 30-60 Cc As Needed Constipation 7)  Proair Hfa 108 (90 Base) Mcg/act Aers (Albuterol Sulfate) .... 2 Inh Qid As Needed  Current Medications (verified): 1)  Vitamin D 1000 Unit Tabs (Cholecalciferol) 2)  Metamucil 0.52 Gm Caps (Psyllium) .... 2 Caps Once Daily - Two Times A Day 3)  Pennsaid 1.5 % Soln (Diclofenac Sodium) .... 3-5 Gtt On Skin Three Times A Day For Pain 4)  Hydrocodone-Acetaminophen 10-325 Mg Tabs (Hydrocodone-Acetaminophen) .Marland Kitchen.. 1 By Mouth Two Times A Day By Mouth Bid 5)  Androgel Pump 1.6 % Gel (Testosterone) .... 2 Pumps On Skin Qam 6)  Lactulose 20 Gm/72ml Soln (Lactulose) .... 30-60 Cc As Needed Constipation 7)  Proair Hfa 108 (90 Base) Mcg/act Aers (Albuterol Sulfate) .... 2 Inh Qid As Needed 8)  Azithromycin 250 Mg Tabs (Azithromycin) .... 2po Qd For 1 Day, Then 1po Qd For 4days, Then Stop  Allergies (verified): No Known Drug Allergies  Past History:  Past  Medical History: Last updated: 12/15/2009 Asthma ADD Hyperlipidemia Hypogonadism Allergic rhinitis glucose intolerance OSA R cervical radiculopathy Dr Gerlene Fee 2007 Constipation 2011  Past Surgical History: Last updated: 04/08/2007 s/p uvulopalatoplasty  Social History: Last updated: 03/30/2010 Former Smoker Alcohol use-yes Single Drug use-no  Risk Factors: Smoking Status: quit (06/17/2009)  Social History: Former Smoker Alcohol use-yes Single Drug use-no Drug Use:  no  Review of Systems       all otherwise negative per pt -    Physical Exam  General:  alert and overweight-appearing.  , not ill appearing Head:  normocephalic and atraumatic.   Eyes:  vision grossly intact, pupils equal, and pupils round.   Ears:  R ear normal.  , but left TM with mod to severe erythema, non bulging Nose:  no external deformity and no nasal discharge.   Mouth:  no gingival abnormalities and pharynx pink and moist.   Neck:  supple and no masses.   Lungs:  normal respiratory effort and normal breath sounds.   Heart:  normal rate and regular rhythm.   Msk:  spine nontender, no paravertebral tender or sweling or rash Extremities:  no edema, no erythema  Neurologic:  cranial nerves II-XII intact, strength normal in all extremities, gait normal, and DTRs symmetrical and normal.   Psych:  not depressed appearing and moderately anxious.     Impression & Recommendations:  Problem # 1:  HEADACHE (ICD-784.0)  His updated medication list for this problem includes:    Hydrocodone-acetaminophen 10-325 Mg Tabs (Hydrocodone-acetaminophen) .Marland Kitchen... 1 by mouth two times a day by mouth bid  Orders: Radiology Referral (Radiology) atypical, ? related to left medial ear, but cant r/o intracranial problem - for head MRI  Problem # 2:  OTITIS MEDIA, LEFT (ICD-382.9)  His updated medication list for this problem includes:    Azithromycin 250 Mg Tabs (Azithromycin) .Marland Kitchen... 2po qd for 1 day, then 1po  qd for 4days, then stop afeb and asympt apparently except for left tm erythema, treat as above, f/u any worsening signs or symptoms   Problem # 3:  NECK PAIN (ICD-723.1)  His updated medication list for this problem includes:    Hydrocodone-acetaminophen 10-325 Mg Tabs (Hydrocodone-acetaminophen) .Marland Kitchen... 1 by mouth two times a day by mouth bid stable overall by hx and exam, ok to continue meds/tx as is - right only and currently asymptomatic, uses narcotic sparingly  Complete Medication List: 1)  Vitamin D 1000 Unit Tabs (Cholecalciferol) 2)  Metamucil 0.52 Gm Caps (Psyllium) .... 2 caps once daily - two times a day 3)  Pennsaid 1.5 % Soln (Diclofenac sodium) .... 3-5 gtt on skin three times a day for pain 4)  Hydrocodone-acetaminophen 10-325 Mg Tabs (Hydrocodone-acetaminophen) .Marland Kitchen.. 1 by mouth two times a  day by mouth bid 5)  Androgel Pump 1.6 % Gel (testosterone)  .... 2 pumps on skin qam 6)  Lactulose 20 Gm/44ml Soln (Lactulose) .... 30-60 cc as needed constipation 7)  Proair Hfa 108 (90 Base) Mcg/act Aers (Albuterol sulfate) .... 2 inh qid as needed 8)  Azithromycin 250 Mg Tabs (Azithromycin) .... 2po qd for 1 day, then 1po qd for 4days, then stop  Patient Instructions: 1)  Please take all new medications as prescribed 2)  Continue all previous medications as before this visit  3)  You will be contacted about the referral(s) to: Head MRI 4)  Please schedule an appointment with your primary doctor as needed Prescriptions: AZITHROMYCIN 250 MG TABS (AZITHROMYCIN) 2po qd for 1 day, then 1po qd for 4days, then stop  #6 x 1   Entered and Authorized by:   Corwin Levins MD   Signed by:   Corwin Levins MD on 03/30/2010   Method used:   Print then Give to Patient   RxID:   [id number]    Orders Added: 1)  Radiology Referral [Radiology] 2)  Est. Patient Level IV [[address]]

## 2010-06-21 NOTE — Letter (Signed)
Summary: Results Letter  [facility]  [address] [place], [place] [address]   Phone: [phone]  Fax: [phone]        November 19, 2009 MRN: [id number]    [facility] [address] [place], [place]  [address]    Dear Victor Montes,   One of the polyps removed during your recent procedure was proven to be adenomatous.  These are pre-cancerous polyps that may have grown into cancers if they had not been removed.  Based on current nationally recognized surveillance guidelines, I recommend that you have a repeat colonoscopy in 5 years.  We will therefore put your information in our reminder system and will contact you in 5 years to schedule a repeat procedure.  Please call if you have any questions or concerns.       Sincerely,  Rachael Fee MD  This letter has been electronically signed by your physician.  Appended Document: Results Letter letter mailed 7.6.11

## 2010-06-21 NOTE — Letter (Signed)
Summary: [facility] Instructions  [facility]  [address] [place], [place] [address]   Phone: [phone]  Fax: [phone]       Victor Montes    [birth year]    MRN: [id number]        Procedure Day Dorna Bloom:  Wednesday 11/17/2009     Arrival Time: 7:30 am      Procedure Time: 8:30 am     Location of Procedure:                    _ x_  [facility] (4th Floor)                        PREPARATION FOR COLONOSCOPY WITH MOVIPREP   Starting 5 days prior to your procedure Friday 6/24 do not eat nuts, seeds, popcorn, corn, beans, peas,  salads, or any raw vegetables.  Do not take any fiber supplements (e.g. Metamucil, Citrucel, and Benefiber).  THE DAY BEFORE YOUR PROCEDURE         DATE: Tuesday 6/28  1.  Drink clear liquids the entire day-NO SOLID FOOD  2.  Do not drink anything colored red or purple.  Avoid juices with pulp.  No orange juice.  3.  Drink at least 64 oz. (8 glasses) of fluid/clear liquids during the day to prevent dehydration and help the prep work efficiently.  CLEAR LIQUIDS INCLUDE: Water Jello Ice Popsicles Tea (sugar ok, no milk/cream) Powdered fruit flavored drinks Coffee (sugar ok, no milk/cream) Gatorade Juice: apple, white grape, white cranberry  Lemonade Clear bullion, consomm, broth Carbonated beverages (any kind) Strained chicken noodle soup Hard Candy                             4.  In the morning, mix first dose of MoviPrep solution:    Empty 1 Pouch A and 1 Pouch B into the disposable container    Add lukewarm drinking water to the top line of the container. Mix to dissolve    Refrigerate (mixed solution should be used within 24 hrs)  5.  Begin drinking the prep at 5:00 p.m. The MoviPrep container is divided by 4 marks.   Every 15 minutes drink the solution down to the next Brennan (approximately 8 oz) until the full liter is complete.   6.  Follow completed prep with 16 oz of clear liquid of your choice (Nothing  red or purple).  Continue to drink clear liquids until bedtime.  7.  Before going to bed, mix second dose of MoviPrep solution:    Empty 1 Pouch A and 1 Pouch B into the disposable container    Add lukewarm drinking water to the top line of the container. Mix to dissolve    Refrigerate  THE DAY OF YOUR PROCEDURE      DATE: Wednesday 6/29  Beginning at 3:30 a.m. (5 hours before procedure):         1. Every 15 minutes, drink the solution down to the next Kaimani (approx 8 oz) until the full liter is complete.  2. Follow completed prep with 16 oz. of clear liquid of your choice.    3. You may drink clear liquids until 6:30 am (2 HOURS BEFORE PROCEDURE).   MEDICATION INSTRUCTIONS  Unless otherwise instructed, you should take regular prescription medications with a small sip of water   as early as possible the morning of  your procedure.           OTHER INSTRUCTIONS  You will need a responsible adult at least 57 years of age to accompany you and drive you home.   This person must remain in the waiting room during your procedure.  Wear loose fitting clothing that is easily removed.  Leave jewelry and other valuables at home.  However, you may wish to bring a book to read or  an iPod/MP3 player to listen to music as you wait for your procedure to start.  Remove all body piercing jewelry and leave at home.  Total time from sign-in until discharge is approximately 2-3 hours.  You should go home directly after your procedure and rest.  You can resume normal activities the  day after your procedure.  The day of your procedure you should not:   Drive   Make legal decisions   Operate machinery   Drink alcohol   Return to work  You will receive specific instructions about eating, activities and medications before you leave.    The above instructions have been reviewed and explained to me by   Clide Cliff, RN_______________________    I fully understand and can  verbalize these instructions _____________________________ Date _________

## 2010-06-21 NOTE — Assessment & Plan Note (Signed)
Summary: BOWEL MOVEMENT PROBLEM  STC   Vital Signs:  Patient profile:   55 year old male Height:      78 inches Weight:      229.75 pounds BMI:     26.65 O2 Sat:      95 % on Room air Temp:     97.1 degrees F oral Pulse rate:   73 / minute BP sitting:   132 / 68  (left arm) Cuff size:   regular  Vitals Entered By: Lucious Groves (Oct 06, 2009 9:21 AM)  O2 Flow:  Room air CC: C/O change/shift in bowel movements./kb Is Patient Diabetic? No Pain Assessment Patient in pain? no      Comments Patient denies seeing any blood with BM./kb   CC:  C/O change/shift in bowel movements./kb.  History of Present Illness: F/u hypogonadism, rhinitis. C/o infrequent BMs - went from once daily -  every other day x 3 wks: now once a wk with a laxative. Never had a colonoscopy. No new meds. Lost wt on diet. F  Current Medications (verified): 1)  Androgel Pump 1 % Gel (Testosterone) .Marland Kitchen.. 10 G On Skin Qam 2)  Vitamin D 1000 Unit Tabs (Cholecalciferol)  Allergies (verified): No Known Drug Allergies  Past History:  Past Medical History: Last updated: 05/19/2009 Asthma ADD Hyperlipidemia Hypogonadism Allergic rhinitis glucose intolerance OSA R cervical radiculopathy Dr Gerlene Fee 2007  Family History: Last updated: 05-08-07 mother died with colon cancer Family History Hypertension Family History of CAD Male 1st degree relative   Social History: Last updated: 06/17/2009 Former Smoker Alcohol use-yes Single  Review of Systems  The patient denies fever, chest pain, abdominal pain, melena, hematochezia, and severe indigestion/heartburn.         Lost wt on diet  Physical Exam  General:  well developed, well nourished, in no acute distress Nose:  External nasal examination shows no deformity or inflammation. Nasal mucosa are pink and moist without lesions or exudates. Mouth:  centimeter diameter shallow ulcer at the left oral pharynx Lungs:  CTA Heart:  RRR Abdomen:  Bowel  sounds positive,abdomen soft and non-tender without masses, organomegaly or hernias noted. Rectal:  no external abnormalities, no hemorrhoids, and no masses.  G(-) stool Prostate:  no gland enlargement.   Skin:  normal texture and temp. 1cm seb cyst on R upper back, NT Psych:  alert and cooperative; normal mood and affect; normal attention span and concentration   Impression & Recommendations:  Problem # 1:  ALLERGIC RHINITIS (ICD-477.9) Assessment Unchanged  Problem # 2:  CHANGE IN BOWELS (ICD-787.99)  Metamucil Labs  Orders: TLB-TSH (Thyroid Stimulating Hormone) (84443-TSH) TLB-CBC Platelet - w/Differential (85025-CBCD)  Problem # 3:  HYPOGONADISM, MALE (ICD-257.2) Assessment: Comment Only  Orders: TLB-TSH (Thyroid Stimulating Hormone) (84443-TSH) TLB-CBC Platelet - w/Differential (85025-CBCD)  Complete Medication List: 1)  Androgel Pump 1 % Gel (Testosterone) .Marland Kitchen.. 10 g on skin qam 2)  Vitamin D 1000 Unit Tabs (Cholecalciferol) 3)  Metamucil 0.52 Gm Caps (Psyllium) .... 2 caps once daily - two times a day  Other Orders: TLB-BMP (Basic Metabolic Panel-BMET) (80048-METABOL) Gastroenterology Referral (GI)  Patient Instructions: 1)  Keep return office visit  Prescriptions: METAMUCIL 0.52 GM CAPS (PSYLLIUM) 2 caps once daily - two times a day  #100 x 3   Entered and Authorized by:   Tresa Garter MD   Signed by:   Tresa Garter MD on 10/06/2009   Method used:   Print then Give to Patient  RxID:   [id number]

## 2010-06-21 NOTE — Progress Notes (Signed)
Summary: HA better?     ---- 04/21/2010 11:06 PM, Georgina Quint Plotnikov MD wrote: Victor Montes, please call pt: Is HA is better?  Thanks, AP ------------------------------  Phone Note Outgoing Call   Summary of Call: left mess for pt to call back  Initial call taken by: Lanier Prude, [facility]),  April 25, 2010 8:58 AM  Follow-up for Phone Call        Spoke with patient who states that headache is better and neck has improved, however still some stiffness. Patient would like to know "how to make this permanant" once nerve block wears off.Alvy Beal Archie CMA  April 26, 2010 10:28 AM   Additional Follow-up for Phone Call Additional follow up Details #1::        Use exercises for ROM - stretch - look up cervical stretching exercises on [email or web address] Additional Follow-up by: Tresa Garter MD,  April 26, 2010 6:13 PM    Additional Follow-up for Phone Call Additional follow up Details #2::    left detailed vm on pt's hm # Follow-up by: Lamar Sprinkles, CMA,  April 27, 2010 6:00 PM

## 2010-06-21 NOTE — Miscellaneous (Signed)
Summary: RT Shoulder trigger point inject/[facility]  RT Shoulder trigger point inject/[facility]   Imported By: Sherian Rein 10/27/2009 10:56:20  _____________________________________________________________________  External Attachment:    Type:   Image     Comment:   External Document

## 2010-06-21 NOTE — Assessment & Plan Note (Signed)
Summary: 6 mo rov /nws  #   Vital Signs:  Patient profile:   55 year old male Height:      78 inches Weight:      236 pounds BMI:     27.37 O2 Sat:      97 % on Room air Temp:     99.0 degrees F oral Pulse rate:   66 / minute Pulse rhythm:   regular Resp:     16 per minute BP sitting:   110 / 70  (left arm) Cuff size:   regular  Vitals Entered By: Lanier Prude, CMA(AAMA) (December 15, 2009 2:56 PM)  O2 Flow:  Room air CC: 6 mo f/u Is Patient Diabetic? No Comments pt is not taking Metamucil, Pennsaid, Prednisone or Hydrocodon/Acetamin.    CC:  6 mo f/u.  History of Present Illness: The patient presents for a follow up of R shoulder pain - resolved after Prednisone hypogonadism, hyperlipidemia. C/o constipation.   Current Medications (verified): 1)  Androgel Pump 1 % Gel (Testosterone) .Marland Kitchen.. 10 G On Skin Qam 2)  Vitamin D 1000 Unit Tabs (Cholecalciferol) 3)  Metamucil 0.52 Gm Caps (Psyllium) .... 2 Caps Once Daily - Two Times A Day 4)  Pennsaid 1.5 % Soln (Diclofenac Sodium) .... 3-5 Gtt On Skin Three Times A Day For Pain 5)  Prednisone 10 Mg Tabs (Prednisone) .... Take 40mg  Qd For 3 Days, Then 20 Mg Qd For 3 Days, Then 10mg  Qd For 6 Days, Then Stop. Take Pc. 6)  Hydrocodone-Acetaminophen 10-325 Mg Tabs (Hydrocodone-Acetaminophen) .Marland Kitchen.. 1 By Mouth Two Times A Day By Mouth Bid  Allergies (verified): No Known Drug Allergies  Past History:  Past Medical History: Asthma ADD Hyperlipidemia Hypogonadism Allergic rhinitis glucose intolerance OSA R cervical radiculopathy Dr Gerlene Fee 2007 Constipation 2011  Review of Systems  The patient denies fever, dyspnea on exertion, hemoptysis, and abdominal pain.    Physical Exam  General:  well developed, well nourished, in no acute distress Nose:  External nasal examination shows no deformity or inflammation. Nasal mucosa are pink and moist without lesions or exudates. Mouth:  centimeter diameter shallow ulcer at the left oral  pharynx Neck:  Cervical spine is tender to palpation over paraspinal muscles and with the ROM  Lungs:  CTA Heart:  RRR Abdomen:  Bowel sounds positive,abdomen soft and non-tender without masses, organomegaly or hernias noted. Msk:  R mid-deltoid NT Shoulder with nl ROM Neurologic:  No cranial nerve deficits noted. Station and gait are normal. Plantar reflexes are down-going bilaterally. DTRs are symmetrical throughout. Sensory, motor and coordinative functions appear intact. Skin:  normal texture and temp. 1cm seb cyst on R upper back, NT Psych:  alert and cooperative; normal mood and affect; normal attention span and concentration   Impression & Recommendations:  Problem # 1:  SHOULDER PAIN (ICD-719.41) Assessment Improved  His updated medication list for this problem includes:    Hydrocodone-acetaminophen 10-325 Mg Tabs (Hydrocodone-acetaminophen) .Marland Kitchen... 1 by mouth two times a day by mouth bid  Problem # 2:  HYPOGONADISM, MALE (ICD-257.2) Assessment: Unchanged  Orders: TLB-Testosterone, Total (84403-TESTO) TLB-BMP (Basic Metabolic Panel-BMET) (80048-METABOL) TLB-CBC Platelet - w/Differential (85025-CBCD) TLB-Hepatic/Liver Function Pnl (80076-HEPATIC) TLB-TSH (Thyroid Stimulating Hormone) (84443-TSH) TLB-B12, Serum-Total ONLY ([address]-U04)  Problem # 3:  HYPERLIPIDEMIA (ICD-272.4) Assessment: Unchanged  Orders: TLB-Testosterone, Total (84403-TESTO) TLB-BMP (Basic Metabolic Panel-BMET) (80048-METABOL) TLB-CBC Platelet - w/Differential (85025-CBCD) TLB-Hepatic/Liver Function Pnl (80076-HEPATIC) TLB-TSH (Thyroid Stimulating Hormone) (84443-TSH) TLB-B12, Serum-Total ONLY (54098-J19)  Problem # 4:  ASTHMA (ICD-493.90) Assessment:  Improved  The following medications were removed from the medication list:    Prednisone 10 Mg Tabs (Prednisone) .Marland Kitchen... Take 40mg  qd for 3 days, then 20 mg qd for 3 days, then 10mg  qd for 6 days, then stop. take pc. His updated medication list for this  problem includes:    Proair Hfa 108 (90 Base) Mcg/act Aers (Albuterol sulfate) .Marland Kitchen... 2 inh qid as needed  Complete Medication List: 1)  Vitamin D 1000 Unit Tabs (Cholecalciferol) 2)  Metamucil 0.52 Gm Caps (Psyllium) .... 2 caps once daily - two times a day 3)  Pennsaid 1.5 % Soln (Diclofenac sodium) .... 3-5 gtt on skin three times a day for pain 4)  Hydrocodone-acetaminophen 10-325 Mg Tabs (Hydrocodone-acetaminophen) .Marland Kitchen.. 1 by mouth two times a day by mouth bid 5)  Androgel Pump 1.6 % Gel (testosterone)  .... 2 pumps on skin qam 6)  Lactulose 20 Gm/9ml Soln (Lactulose) .... 30-60 cc as needed constipation 7)  Proair Hfa 108 (90 Base) Mcg/act Aers (Albuterol sulfate) .... 2 inh qid as needed  Patient Instructions: 1)  Please schedule a follow-up appointment in 4 months. Prescriptions: PROAIR HFA 108 (90 BASE) MCG/ACT AERS (ALBUTEROL SULFATE) 2 inh qid as needed  #1 x 6   Entered and Authorized by:   Tresa Garter MD   Signed by:   Tresa Garter MD on 12/15/2009   Method used:   Print then Give to Patient   RxID:   [id number] LACTULOSE 20 GM/30ML SOLN (LACTULOSE) 30-60 cc as needed constipation  #575ml x 6   Entered and Authorized by:   Tresa Garter MD   Signed by:   Tresa Garter MD on 12/15/2009   Method used:   Print then Give to Patient   RxID:   [id number] ANDROGEL PUMP 1.6 % GEL (TESTOSTERONE) 10 g on skin qam  #1 x 12   Entered and Authorized by:   Tresa Garter MD   Signed by:   Tresa Garter MD on 12/15/2009   Method used:   Print then Give to Patient   RxID:   [id number]

## 2010-06-21 NOTE — Assessment & Plan Note (Signed)
Summary: 4 mos f/u #/cd   Vital Signs:  Patient profile:   55 year old male Height:      78 inches Weight:      236 pounds BMI:     27.37 Temp:     98.4 degrees F oral Pulse rate:   76 / minute Pulse rhythm:   regular Resp:     16 per minute BP sitting:   142 / 90  (left arm) Cuff size:   regular  Vitals Entered By: Lanier Prude, Beverly Gust) (April 20, 2010 2:51 PM)  Procedure Note  Injections: The patient complains of pain. Duration of symptoms: 1-2 weeks Indication: acute pain Consent signed: yes  Procedure # 1: occipital nerve block    Region: medial    Location: L prox neck    Technique: 25 g needle    Medication: 20 mg depomedrol    Anesthesia: 3.0 ml 1% lidocaine w/o epinephrine    Comment: Risks including but not limited by incomplete procedure, bleeding, infection, recurrence were discussed with the patient. Consent form was signed. Tolerated well. Complicatons - none. Good pain relief following the procedure.   Cleaned and prepped with: alcohol and betadine Wound dressing: bandaid  CC: 4 mo f/u  Is Patient Diabetic? No Comments pt is not taking metamucil, pennsaid, Hydrocodone.  He states he never got results from brain MRI   CC:  4 mo f/u .  History of Present Illness: F/u L occipital area HAs - not any better. We reviewd w/pt Dr Raphael Gibney note and labs/MRI  Current Medications (verified): 1)  Vitamin D 1000 Unit Tabs (Cholecalciferol) 2)  Metamucil 0.52 Gm Caps (Psyllium) .... 2 Caps Once Daily - Two Times A Day 3)  Pennsaid 1.5 % Soln (Diclofenac Sodium) .... 3-5 Gtt On Skin Three Times A Day For Pain 4)  Hydrocodone-Acetaminophen 10-325 Mg Tabs (Hydrocodone-Acetaminophen) .Marland Kitchen.. 1 By Mouth Two Times A Day By Mouth Bid 5)  Androgel Pump 1.6 % Gel (Testosterone) .... 2 Pumps On Skin Qam 6)  Lactulose 20 Gm/66ml Soln (Lactulose) .... 30-60 Cc As Needed Constipation 7)  Proair Hfa 108 (90 Base) Mcg/act Aers (Albuterol Sulfate) .... 2 Inh Qid As Needed 8)   Azithromycin 250 Mg Tabs (Azithromycin) .... 2po Qd For 1 Day, Then 1po Qd For 4days, Then Stop  Allergies (verified): No Known Drug Allergies  Past History:  Past Medical History: Asthma ADD Hyperlipidemia Hypogonadism Allergic rhinitis glucose intolerance OSA R cervical radiculopathy Dr Gerlene Fee 2007 Constipation 2011 L occipital neuralgia 2011  Review of Systems  The patient denies fever and vision loss.         no n/v  Physical Exam  General:  alert and overweight-appearing.  , not ill appearing Ears:  R ear normal.  , but left TM with mod to severe erythema, non bulging Nose:  no external deformity and no nasal discharge.   Mouth:  no gingival abnormalities and pharynx pink and moist.   Neck:  Prox neck and scull base is tender on L Heart:  Normal rate and regular rhythm. S1 and S2 normal without gallop, murmur, click, rub or other extra sounds. Skin:  normal texture and temp. 1cm seb cyst on R upper back, NT Psych:  Oriented X3.     Impression & Recommendations:  Problem # 1:  HEADACHE (ICD-784.0) L due to #2 Assessment Unchanged  We reviewd w/pt Dr Raphael Gibney note and labs/MRI His updated medication list for this problem includes:    Hydrocodone-acetaminophen 10-325 Mg Tabs (  Hydrocodone-acetaminophen) .Marland Kitchen... 1 by mouth two times a day by mouth bid  Problem # 2:  OCCIPITAL NEURALGIA (ICD-723.8) Assessment: Unchanged Rx options discusssed. He would like to have an inj. - nervr block Orders: Depo-Medrol 20mg  (J1020) EMR Misc Charge Code [facility])  Complete Medication List: 1)  Vitamin D 1000 Unit Tabs (Cholecalciferol) 2)  Metamucil 0.52 Gm Caps (Psyllium) .... 2 caps once daily - two times a day 3)  Pennsaid 1.5 % Soln (Diclofenac sodium) .... 3-5 gtt on skin three times a day for pain 4)  Hydrocodone-acetaminophen 10-325 Mg Tabs (Hydrocodone-acetaminophen) .Marland Kitchen.. 1 by mouth two times a day by mouth bid 5)  Androgel Pump 1.6 % Gel (testosterone)  .... 2 pumps on  skin qam 6)  Lactulose 20 Gm/79ml Soln (Lactulose) .... 30-60 cc as needed constipation 7)  Proair Hfa 108 (90 Base) Mcg/act Aers (Albuterol sulfate) .... 2 inh qid as needed 8)  Azithromycin 250 Mg Tabs (Azithromycin) .... 2po qd for 1 day, then 1po qd for 4days, then stop 9)  Voltaren 1 % Gel (Diclofenac sodium) .... Use two times a day on a painfull area  Patient Instructions: 1)  Call if you are not better in a reasonable amount of time or if worse.  Prescriptions: VOLTAREN 1 % GEL (DICLOFENAC SODIUM) use two times a day on a painfull area  #90 g x 3   Entered and Authorized by:   Tresa Garter MD   Signed by:   Tresa Garter MD on 04/20/2010   Method used:   Print then Give to Patient   RxID:   [id number]    Orders Added: 1)  Est. Patient Level III [29528] 2)  Depo-Medrol 20mg  [J1020] 3)  EMR Misc Charge Code [EMRMisc]

## 2010-06-21 NOTE — Progress Notes (Signed)
Summary: Androgel clarification  Phone Note From Pharmacy   Caller: [facility]  [address] [id number]*---[place] Summary of Call: Ebony Cargo called to verify prescription they received for Androgel Pump. This is a different dosage and he would like to be sure the patient was to increase. Patient would previously get 100gm of androgel and the new strength will provide 162gm. Is this ok our should the 10gm daily be decreased? Please advise.  Also, prescription was sent for 1 box and he states that will only last the patient for one week. Please change the quantity to 4. Initial call taken by: Lucious Groves CMA,  December 16, 2009 9:00 AM  Follow-up for Phone Call        Sorry - the dose is 2 pumps a day in am. Thx Follow-up by: Tresa Garter MD,  December 16, 2009 12:09 PM  Additional Follow-up for Phone Call Additional follow up Details #1::        Shredded written rx, called into pharmacy Additional Follow-up by: Lamar Sprinkles, CMA,  December 16, 2009 2:10 PM    New/Updated Medications: * ANDROGEL PUMP 1.6 % GEL (TESTOSTERONE) 2 pumps on skin qam * ANDROGEL PUMP 1.6 % GEL (TESTOSTERONE) 2 pumps on skin qam Prescriptions: ANDROGEL PUMP 1.6 % GEL (TESTOSTERONE) 2 pumps on skin qam  #1 month x 3   Entered by:   Lucious Groves CMA   Authorized by:   Tresa Garter MD   Signed by:   Lucious Groves CMA on 12/16/2009   Method used:   Printed then faxed to ...       [facility]  [address] [phone]* (retail)       [address]       [place], [place]  [address]       Ph: [phone] or [phone]       Fax: [phone]   RxID:   [id number]

## 2010-06-21 NOTE — Progress Notes (Signed)
Summary: SHOULDER PAIN   Phone Note Call from Patient Call back at Work Phone [phone] Call back at [phone]    Summary of Call: Shoulder injection has not given pt relief. Tramadol does not help. The pain in severe in the evening thru the night into the am. Patient is requesting rx for pain  Initial call taken by: Lamar Sprinkles, CMA,  October 29, 2009 11:38 AM  Follow-up for Phone Call        Sorry! Will start Prednisone - Take 40mg  qd for 3 days, then 20 mg qd for 3 days, then 10mg  qd for 6 days, then stop. Take pc.  Use Vicodin Follow-up by: Tresa Garter MD,  October 29, 2009 5:30 PM  Additional Follow-up for Phone Call Additional follow up Details #1::        Pt informed  Additional Follow-up by: Lamar Sprinkles, CMA,  October 29, 2009 6:11 PM    New/Updated Medications: PREDNISONE 10 MG TABS (PREDNISONE) Take 40mg  qd for 3 days, then 20 mg qd for 3 days, then 10mg  qd for 6 days, then stop. Take pc. HYDROCODONE-ACETAMINOPHEN 10-325 MG TABS (HYDROCODONE-ACETAMINOPHEN) 1 by mouth two times a day by mouth bid Prescriptions: HYDROCODONE-ACETAMINOPHEN 10-325 MG TABS (HYDROCODONE-ACETAMINOPHEN) 1 by mouth two times a day by mouth bid  #60 x 2   Entered by:   Lamar Sprinkles, CMA   Authorized by:   Tresa Garter MD   Signed by:   Lamar Sprinkles, CMA on 10/29/2009   Method used:   Telephoned to ...       [facility]  [address] [phone]* (retail)       [address]       [place], [place]  [address]       Ph: [phone] or [phone]       Fax: [phone]   RxID:   [id number] PREDNISONE 10 MG TABS (PREDNISONE) Take 40mg  qd for 3 days, then 20 mg qd for 3 days, then 10mg  qd for 6 days, then stop. Take pc.  #24 x 1   Entered by:   Lamar Sprinkles, CMA   Authorized by:   Tresa Garter MD   Signed by:   Lamar Sprinkles, CMA on 10/29/2009   Method used:   Telephoned to ...       [facility] [address] [phone]* (retail)       [address]       [place],  [place]  [address]       Ph: [phone] or [phone]       Fax: [phone]   RxID:   [id number]

## 2010-06-21 NOTE — Medication Information (Signed)
Summary: [facility]EL [facility] til [facility]  [facility]EL [facility] til [facility]   Imported By: Lester [place] 05/25/2009 08:29:59  _____________________________________________________________________  External Attachment:    Type:   Image     Comment:   External Document

## 2010-06-21 NOTE — Assessment & Plan Note (Signed)
Summary: SHOULDER PAIN/N[facility]   Vital Signs:  Patient profile:   55 year old male Height:      78 inches Weight:      237 pounds BMI:     27.49 O2 Sat:      97 % on Room air Temp:     97.8 degrees F oral Pulse rate:   60 / minute BP sitting:   134 / 70  (left arm) Cuff size:   regular  Vitals Entered By: Lucious Groves (October 25, 2009 4:21 PM)  O2 Flow:  Room air  Procedure Note  Injections: The patient complains of pain. Duration of symptoms: 2wks Indication: acute pain Consent signed: yes  Procedure # 1: trigger point injection    Region: lateral    Location: R deltoid    Technique: 25 g needle    Medication: 20 mg depomedrol    Anesthesia: 2.0 ml 1% lidocaine w/o epinephrine    Comment: Risks including but not limited by incomplete procedure, bleeding, infection, recurrence were discussed with the patient. Consent form was signed. Tolerated well. Complicatons - none. Good pain relief following the procedure.   Cleaned and prepped with: alcohol and betadine Wound dressing: bandaid  CC: C/O shoulder pain x10 days./kb Is Patient Diabetic? No Pain Assessment Patient in pain? yes     Location: shoulder Intensity: 9 Type: throbbing Onset of pain  10 days Comments Patient denies any injury and affiliation with movement./kb   CC:  C/O shoulder pain x10 days./kb.  History of Present Illness: C/o R deltoid pain in 1 spot x 10-12 d - dull like a tooth ache  Current Medications (verified): 1)  Androgel Pump 1 % Gel (Testosterone) .Marland Kitchen.. 10 G On Skin Qam 2)  Vitamin D 1000 Unit Tabs (Cholecalciferol) 3)  Metamucil 0.52 Gm Caps (Psyllium) .... 2 Caps Once Daily - Two Times A Day  Allergies (verified): No Known Drug Allergies  Physical Exam  General:  well developed, well nourished, in no acute distress Msk:  R mid-deltoid w/a very tender trigger point 1 cm wide Shoulder with nl ROM Skin:  normal texture and temp. 1cm seb cyst on R upper back, NT Psych:  alert and  cooperative; normal mood and affect; normal attention span and concentration   Impression & Recommendations:  Problem # 1:  SHOULDER PAIN (ICD-719.41) R focal deltoid tenderness Assessment New  His updated medication list for this problem includes:    Tramadol Hcl 50 Mg Tabs (Tramadol hcl) .Marland Kitchen... 1-2 tabs by mouth two times a day as needed pain Pennsaid prn  Complete Medication List: 1)  Androgel Pump 1 % Gel (Testosterone) .Marland Kitchen.. 10 g on skin qam 2)  Vitamin D 1000 Unit Tabs (Cholecalciferol) 3)  Metamucil 0.52 Gm Caps (Psyllium) .... 2 caps once daily - two times a day 4)  Pennsaid 1.5 % Soln (Diclofenac sodium) .... 3-5 gtt on skin three times a day for pain 5)  Tramadol Hcl 50 Mg Tabs (Tramadol hcl) .Marland Kitchen.. 1-2 tabs by mouth two times a day as needed pain  Other Orders: Trigger Point Injection (1 or 2 muscles) (16109) Depo-Medrol 20mg  (J1020) j  Patient Instructions: 1)  Call if you are not better in a reasonable amount of time or if worse.  Prescriptions: TRAMADOL HCL 50 MG TABS (TRAMADOL HCL) 1-2 tabs by mouth two times a day as needed pain  #60 x 1   Entered and Authorized by:   Tresa Garter MD   Signed by:  Georgina Quint Plotnikov MD on 10/25/2009   Method used:   Print then Give to Patient   RxID:   [id number] PENNSAID 1.5 % SOLN (DICLOFENAC SODIUM) 3-5 gtt on skin three times a day for pain  #1 x 3   Entered and Authorized by:   Tresa Garter MD   Signed by:   Tresa Garter MD on 10/25/2009   Method used:   Print then Give to Patient   RxID:   [id number]

## 2010-06-29 NOTE — Progress Notes (Signed)
Summary: PA-Androgel  Phone Note From Pharmacy   Reason for Call: Patient requests substitution Summary of Call: PA-Androgel, called [employer] @ [phone], form completed and faxed to [phone], awaiting approval. Dagoberto Reef  June 16, 2010 3:53 PM PA approved 06/18/10 - 06/19/11, pt aware. Initial call taken by: Dagoberto Reef,  June 20, 2010 9:35 AM

## 2010-07-19 NOTE — Medication Information (Signed)
Summary: Prior autho & approved for Androgel/[facility]  Prior autho & approved for Androgel/[facility]   Imported By: Sherian Rein 06/22/2010 15:08:59  _____________________________________________________________________  External Attachment:    Type:   Image     Comment:   External Document

## 2010-07-26 ENCOUNTER — Telehealth: Payer: Self-pay | Admitting: Internal Medicine

## 2010-08-02 NOTE — Progress Notes (Signed)
Summary: Testosterone Rf-plot pt  Phone Note Refill Request Message from:  Fax from Pharmacy  Refills Requested: Medication #1:  ANDROGEL PUMP 1.6 % GEL (TESTOSTERONE) 2 pumps on skin qam   Dosage confirmed as above?Dosage Confirmed   Supply Requested: 75gm   Last Refilled: 06/10/2010  Method Requested: Telephone to Pharmacy Initial call taken by: Lanier Prude, [facility]),  July 26, 2010 3:27 PM  Follow-up for Phone Call        ok for refill as needed  Follow-up by: Jacques Navy MD,  July 26, 2010 5:34 PM  Additional Follow-up for Phone Call Additional follow up Details #1::        Rx called to pharmacy Additional Follow-up by: Lanier Prude, [facility]),  July 27, 2010 8:48 AM    Prescriptions: ANDROGEL PUMP 1.6 % GEL (TESTOSTERONE) 2 pumps on skin qam  #75gm x 3   Entered by:   Lanier Prude, [facility])   Authorized by:   Tresa Garter MD   Signed by:   Lanier Prude, CMA(AAMA) on 07/27/2010   Method used:   Telephoned to ...       [facility]  [address] [phone][facility] (retail)       [address]       [place], [place]  [address]       Ph: [phone] or [phone]       Fax: [phone]   RxID:   [id number]

## 2010-10-04 NOTE — Assessment & Plan Note (Signed)
[facility]                           PRIMARY CARE OFFICE NOTE   SHIHEEM, CORPORAN                        MRN:          [id number]  DATE:12/14/2006                            DOB:          04/11/1956    PROCEDURE:  Left elbow steroid injection.   INDICATIONS:  Tennis elbow on the left.   Risks, including unsuccessful procedure, bleeding, infection, and  others, as well as benefits explained to the patient in detail, and he  agrees to proceed.  He was placed in decubitus position.  Left elbow was  prepped with Betadine and alcohol, and injected with 20 mg of Depo-  Medrol and 1 cc of 2% lidocaine was in the usual fashion.  Tolerated it  well.  Complications none.  Good pain relief after the injection.  Band-  Aid applied.  The area was massaged.     Georgina Quint. Plotnikov, MD  Electronically Signed    AVP/MedQ  DD: 12/14/2006  DT: 12/14/2006  Job #: [id number]

## 2010-10-04 NOTE — Procedures (Signed)
[facility]                                PROCEDURE NOTE   QUASHAWN, JEWKES                        MRN:          [id number]  DATE:12/14/2006                            DOB:          02/15/1956    PROCEDURE:  Left elbow injection.   INDICATION:  Refractory left epicondylitis, lateral.   Risks including unsuccessful procedure, bleeding, infection, and others  as well as benefits were explained to the patient in detail.  He agreed  to proceed.  He was placed in a decubitus position.  The elbow was  prepped with Betadine and alcohol.  The area was injected with 1mL of 2%  lidocaine and 20 mg of Depo-Medrol.  Tolerated well.  Complications:  None.  Good pain relief immediately following the procedure.  The area  was massaged.  Call if problems.     Georgina Quint. Plotnikov, MD  Electronically Signed    AVP/MedQ  DD: 12/14/2006  DT: 12/14/2006  Job #: [id number]

## 2010-11-25 ENCOUNTER — Ambulatory Visit (INDEPENDENT_AMBULATORY_CARE_PROVIDER_SITE_OTHER): Payer: 59 | Admitting: Internal Medicine

## 2010-11-25 ENCOUNTER — Other Ambulatory Visit (INDEPENDENT_AMBULATORY_CARE_PROVIDER_SITE_OTHER): Payer: 59

## 2010-11-25 ENCOUNTER — Encounter: Payer: Self-pay | Admitting: Internal Medicine

## 2010-11-25 DIAGNOSIS — M542 Cervicalgia: Secondary | ICD-10-CM

## 2010-11-25 DIAGNOSIS — E291 Testicular hypofunction: Secondary | ICD-10-CM

## 2010-11-25 DIAGNOSIS — R5381 Other malaise: Secondary | ICD-10-CM

## 2010-11-25 DIAGNOSIS — R5383 Other fatigue: Secondary | ICD-10-CM | POA: Insufficient documentation

## 2010-11-25 LAB — CBC WITH DIFFERENTIAL/PLATELET
Basophils Relative: 1.1 % (ref 0.0–3.0)
Eosinophils Absolute: 0 10*3/uL (ref 0.0–0.7)
Hemoglobin: 16.2 g/dL (ref 13.0–17.0)
Lymphocytes Relative: 13.9 % (ref 12.0–46.0)
MCHC: 34.6 g/dL (ref 30.0–36.0)
Monocytes Relative: 6.2 % (ref 3.0–12.0)
Neutro Abs: 10.2 10*3/uL — ABNORMAL HIGH (ref 1.4–7.7)
RBC: 4.95 Mil/uL (ref 4.22–5.81)

## 2010-11-25 LAB — COMPREHENSIVE METABOLIC PANEL
AST: 22 U/L (ref 0–37)
BUN: 20 mg/dL (ref 6–23)
Calcium: 9.2 mg/dL (ref 8.4–10.5)
Chloride: 108 mEq/L (ref 96–112)
Creatinine, Ser: 0.9 mg/dL (ref 0.4–1.5)

## 2010-11-25 LAB — CORTISOL: Cortisol, Plasma: 12.3 ug/dL

## 2010-11-25 MED ORDER — TESTOSTERONE 20.25 MG/ACT (1.62%) TD GEL: 2.0000 | TRANSDERMAL | Status: DC

## 2010-11-25 NOTE — Assessment & Plan Note (Signed)
Check labs, increase dose

## 2010-11-25 NOTE — Progress Notes (Signed)
  Subjective:    Patient ID: Victor Montes, male    DOB: 10-Sep-1955, 55 y.o.   MRN: [id number]  HPI  C/o no energy, hot flashes, sweats x 3 months F/u hypogonadism, neck pain  Review of Systems  Constitutional: Positive for fatigue. Negative for appetite change and unexpected weight change.  HENT: Negative for nosebleeds, congestion, sore throat, sneezing, trouble swallowing and neck pain.   Eyes: Negative for itching and visual disturbance.  Respiratory: Negative for cough.   Cardiovascular: Negative for chest pain, palpitations and leg swelling.  Gastrointestinal: Negative for nausea, diarrhea, blood in stool and abdominal distention.  Genitourinary: Negative for frequency and hematuria.  Musculoskeletal: Negative for back pain, joint swelling and gait problem.  Skin: Negative for rash.  Neurological: Negative for dizziness, tremors, speech difficulty and weakness.  Psychiatric/Behavioral: Negative for suicidal ideas, sleep disturbance, dysphoric mood and agitation. The patient is not nervous/anxious.        Objective:   Physical Exam  Constitutional: He is oriented to person, place, and time. He appears well-developed.  HENT:  Mouth/Throat: Oropharynx is clear and moist.  Eyes: Conjunctivae are normal. Pupils are equal, round, and reactive to light.  Neck: Normal range of motion. No JVD present. No thyromegaly present.  Cardiovascular: Normal rate, regular rhythm, normal heart sounds and intact distal pulses.  Exam reveals no gallop and no friction rub.   No murmur heard. Pulmonary/Chest: Effort normal and breath sounds normal. No respiratory distress. He has no wheezes. He has no rales. He exhibits no tenderness.  Abdominal: Soft. Bowel sounds are normal. He exhibits no distension and no mass. There is no tenderness. There is no rebound and no guarding.  Musculoskeletal: Normal range of motion. He exhibits no edema and no tenderness.  Lymphadenopathy:    He has no cervical  adenopathy.  Neurological: He is alert and oriented to person, place, and time. He has normal reflexes. No cranial nerve deficit. He exhibits normal muscle tone. Coordination normal.  Skin: Skin is warm and dry. No rash noted.  Psychiatric: He has a normal mood and affect. His behavior is normal. Judgment and thought content normal.          Assessment & Plan:

## 2010-11-25 NOTE — Assessment & Plan Note (Signed)
Better now 

## 2010-11-28 ENCOUNTER — Telehealth: Payer: Self-pay | Admitting: Internal Medicine

## 2010-11-28 NOTE — Telephone Encounter (Signed)
Pt informed

## 2010-11-28 NOTE — Telephone Encounter (Signed)
Stacey, please, inform patient that all labs are OK Thx   

## 2011-03-01 ENCOUNTER — Ambulatory Visit (INDEPENDENT_AMBULATORY_CARE_PROVIDER_SITE_OTHER): Payer: 59 | Admitting: Internal Medicine

## 2011-03-01 ENCOUNTER — Encounter: Payer: Self-pay | Admitting: Internal Medicine

## 2011-03-01 VITALS — BP 120/68 | HR 76 | Temp 98.4°F | Resp 16 | Wt 237.0 lb

## 2011-03-01 DIAGNOSIS — Z23 Encounter for immunization: Secondary | ICD-10-CM

## 2011-03-01 DIAGNOSIS — E291 Testicular hypofunction: Secondary | ICD-10-CM

## 2011-03-01 DIAGNOSIS — R5381 Other malaise: Secondary | ICD-10-CM

## 2011-03-01 DIAGNOSIS — M542 Cervicalgia: Secondary | ICD-10-CM

## 2011-03-01 DIAGNOSIS — R5383 Other fatigue: Secondary | ICD-10-CM

## 2011-03-01 MED ORDER — TESTOSTERONE 20.25 MG/ACT (1.62%) TD GEL: 2.0000 | TRANSDERMAL | Status: DC

## 2011-03-01 NOTE — Assessment & Plan Note (Signed)
Resolved for now 

## 2011-03-01 NOTE — Assessment & Plan Note (Signed)
On Rx since 2010  Potential benefits of a long term testosteroids  use as well as potential risks  and complications were explained to the patient and were aknowledged. Continue with current prescription therapy as reflected on the Med list.

## 2011-03-01 NOTE — Progress Notes (Signed)
  Subjective:    Patient ID: Victor Montes, male    DOB: 12/11/1955, 55 y.o.   MRN: [id number]  HPI  The patient presents for a follow-up of  chronic hypogonadism, allergies and asthma   Review of Systems  Constitutional: Negative for appetite change, fatigue and unexpected weight change.  HENT: Negative for nosebleeds, congestion, sore throat, sneezing, trouble swallowing and neck pain.   Eyes: Negative for itching and visual disturbance.  Respiratory: Negative for cough.   Cardiovascular: Negative for chest pain, palpitations and leg swelling.  Gastrointestinal: Negative for nausea, diarrhea, blood in stool and abdominal distention.  Genitourinary: Negative for frequency and hematuria.  Musculoskeletal: Negative for back pain, joint swelling and gait problem.  Skin: Negative for rash.  Neurological: Negative for dizziness, tremors, speech difficulty and weakness.  Psychiatric/Behavioral: Negative for sleep disturbance, dysphoric mood and agitation. The patient is not nervous/anxious.    Wt Readings from Last 3 Encounters:  03/01/11 237 lb (107.502 kg)  11/25/10 235 lb (106.595 kg)  04/20/10 236 lb (107.049 kg)       Objective:   Physical Exam  Constitutional: He is oriented to person, place, and time. He appears well-developed and well-nourished.  HENT:  Mouth/Throat: Oropharynx is clear and moist.  Eyes: Conjunctivae are normal. Pupils are equal, round, and reactive to light.  Neck: Normal range of motion. No JVD present. No thyromegaly present.  Cardiovascular: Normal rate, regular rhythm, normal heart sounds and intact distal pulses.  Exam reveals no gallop and no friction rub.   No murmur heard. Pulmonary/Chest: Effort normal and breath sounds normal. No respiratory distress. He has no wheezes. He has no rales. He exhibits no tenderness.  Abdominal: Soft. Bowel sounds are normal. He exhibits no distension and no mass. There is no tenderness. There is no rebound and no  guarding.  Musculoskeletal: Normal range of motion. He exhibits no edema and no tenderness.  Lymphadenopathy:    He has no cervical adenopathy.  Neurological: He is alert and oriented to person, place, and time. He has normal reflexes. No cranial nerve deficit. He exhibits normal muscle tone. Coordination normal.  Skin: Skin is warm and dry. No rash noted.  Psychiatric: He has a normal mood and affect. His behavior is normal. Judgment and thought content normal.          Assessment & Plan:

## 2011-03-01 NOTE — Assessment & Plan Note (Signed)
Better on testosterone 

## 2011-06-19 ENCOUNTER — Telehealth: Payer: Self-pay | Admitting: Internal Medicine

## 2011-06-19 MED ORDER — TESTOSTERONE 30 MG/ACT TD SOLN: 60.0000 mg | TRANSDERMAL | Status: DC

## 2011-06-19 NOTE — Telephone Encounter (Signed)
Use Axiron Keep ROM Thx

## 2011-06-19 NOTE — Telephone Encounter (Signed)
ANDROGEL HAS BEEN DROPPED FROM HIS INSURANCE PLAN.  HE HAS 2 WKS LEFT.  THE ONE YOU PUT UNDERARM IS ONE OF THE 2 THE INSURANCE SUGGESTS.  HE HAS AN APPT FEB 13 .

## 2011-06-20 NOTE — Telephone Encounter (Signed)
Rx phoned in. Pt informed.

## 2011-06-29 ENCOUNTER — Telehealth: Payer: Self-pay | Admitting: *Deleted

## 2011-06-29 NOTE — Telephone Encounter (Signed)
PA for Axiron 30 mg PA approved from 06/29/11 until 06/28/12. Pt and pharmacy informed.

## 2011-07-05 ENCOUNTER — Ambulatory Visit (INDEPENDENT_AMBULATORY_CARE_PROVIDER_SITE_OTHER): Payer: 59 | Admitting: Internal Medicine

## 2011-07-05 ENCOUNTER — Encounter: Payer: Self-pay | Admitting: Internal Medicine

## 2011-07-05 DIAGNOSIS — R5381 Other malaise: Secondary | ICD-10-CM

## 2011-07-05 DIAGNOSIS — E739 Lactose intolerance, unspecified: Secondary | ICD-10-CM

## 2011-07-05 DIAGNOSIS — R5383 Other fatigue: Secondary | ICD-10-CM

## 2011-07-05 DIAGNOSIS — E291 Testicular hypofunction: Secondary | ICD-10-CM

## 2011-07-05 NOTE — Progress Notes (Signed)
Patient ID: Hugo Lybrand, male   DOB: 1955-11-30, 56 y.o.   MRN: [id number]  Subjective:    Patient ID: Liem Copenhaver, male    DOB: [birth year], 56 y.o.   MRN: [id number]  HPI  The patient presents for a follow-up of  chronic hypogonadism, allergies and asthma   Review of Systems  Constitutional: Negative for appetite change, fatigue and unexpected weight change.  HENT: Negative for nosebleeds, congestion, sore throat, sneezing, trouble swallowing and neck pain.   Eyes: Negative for itching and visual disturbance.  Respiratory: Negative for cough.   Cardiovascular: Negative for chest pain, palpitations and leg swelling.  Gastrointestinal: Negative for nausea, diarrhea, blood in stool and abdominal distention.  Genitourinary: Negative for frequency and hematuria.  Musculoskeletal: Negative for back pain, joint swelling and gait problem.  Skin: Negative for rash.  Neurological: Negative for dizziness, tremors, speech difficulty and weakness.  Psychiatric/Behavioral: Negative for sleep disturbance, dysphoric mood and agitation. The patient is not nervous/anxious.    Wt Readings from Last 3 Encounters:  07/05/11 236 lb (107.049 kg)  03/01/11 237 lb (107.502 kg)  11/25/10 235 lb (106.595 kg)       Objective:   Physical Exam  Constitutional: He is oriented to person, place, and time. He appears well-developed and well-nourished.  HENT:  Mouth/Throat: Oropharynx is clear and moist.  Eyes: Conjunctivae are normal. Pupils are equal, round, and reactive to light.  Neck: Normal range of motion. No JVD present. No thyromegaly present.  Cardiovascular: Normal rate, regular rhythm, normal heart sounds and intact distal pulses.  Exam reveals no gallop and no friction rub.   No murmur heard. Pulmonary/Chest: Effort normal and breath sounds normal. No respiratory distress. He has no wheezes. He has no rales. He exhibits no tenderness.  Abdominal: Soft. Bowel sounds are normal. He exhibits no  distension and no mass. There is no tenderness. There is no rebound and no guarding.  Musculoskeletal: Normal range of motion. He exhibits no edema and no tenderness.  Lymphadenopathy:    He has no cervical adenopathy.  Neurological: He is alert and oriented to person, place, and time. He has normal reflexes. No cranial nerve deficit. He exhibits normal muscle tone. Coordination normal.  Skin: Skin is warm and dry. No rash noted.  Psychiatric: He has a normal mood and affect. His behavior is normal. Judgment and thought content normal.          Assessment & Plan:

## 2011-07-05 NOTE — Assessment & Plan Note (Addendum)
On Axiron now Labs in 1 mo

## 2011-07-05 NOTE — Assessment & Plan Note (Signed)
monitoring

## 2011-07-05 NOTE — Assessment & Plan Note (Signed)
2012 ? etiol -- a lot of work

## 2011-07-08 ENCOUNTER — Encounter: Payer: Self-pay | Admitting: Internal Medicine

## 2011-09-19 ENCOUNTER — Other Ambulatory Visit (INDEPENDENT_AMBULATORY_CARE_PROVIDER_SITE_OTHER): Payer: 59

## 2011-09-19 DIAGNOSIS — R5381 Other malaise: Secondary | ICD-10-CM

## 2011-09-19 DIAGNOSIS — R5383 Other fatigue: Secondary | ICD-10-CM

## 2011-09-19 DIAGNOSIS — E291 Testicular hypofunction: Secondary | ICD-10-CM

## 2011-09-19 DIAGNOSIS — Z23 Encounter for immunization: Secondary | ICD-10-CM

## 2011-09-19 DIAGNOSIS — M542 Cervicalgia: Secondary | ICD-10-CM

## 2011-09-19 LAB — CBC WITH DIFFERENTIAL/PLATELET
Basophils Relative: 0.4 % (ref 0.0–3.0)
Eosinophils Relative: 0.5 % (ref 0.0–5.0)
HCT: 47.4 % (ref 39.0–52.0)
Hemoglobin: 16.1 g/dL (ref 13.0–17.0)
Lymphs Abs: 1.8 10*3/uL (ref 0.7–4.0)
Monocytes Relative: 7.3 % (ref 3.0–12.0)
Neutro Abs: 6.5 10*3/uL (ref 1.4–7.7)
RBC: 5.01 Mil/uL (ref 4.22–5.81)
WBC: 9.1 10*3/uL (ref 4.5–10.5)

## 2011-09-19 LAB — URINALYSIS
Bilirubin Urine: NEGATIVE
Ketones, ur: NEGATIVE
Nitrite: NEGATIVE
Total Protein, Urine: NEGATIVE
Urine Glucose: NEGATIVE
pH: 6.5 (ref 5.0–8.0)

## 2011-09-19 LAB — LDL CHOLESTEROL, DIRECT: Direct LDL: 141.9 mg/dL

## 2011-09-19 LAB — BASIC METABOLIC PANEL
CO2: 28 mEq/L (ref 19–32)
Calcium: 9.5 mg/dL (ref 8.4–10.5)
Creatinine, Ser: 0.8 mg/dL (ref 0.4–1.5)
Glucose, Bld: 102 mg/dL — ABNORMAL HIGH (ref 70–99)

## 2011-09-19 LAB — LIPID PANEL
Cholesterol: 212 mg/dL — ABNORMAL HIGH (ref 0–200)
HDL: 59.3 mg/dL (ref >39.00)
Total CHOL/HDL Ratio: 4
Triglycerides: 88 mg/dL (ref 0.0–149.0)
VLDL: 17.6 mg/dL (ref 0.0–40.0)

## 2011-09-20 ENCOUNTER — Encounter: Payer: Self-pay | Admitting: Internal Medicine

## 2011-09-20 ENCOUNTER — Ambulatory Visit (INDEPENDENT_AMBULATORY_CARE_PROVIDER_SITE_OTHER): Payer: 59 | Admitting: Internal Medicine

## 2011-09-20 VITALS — BP 158/96 | HR 76 | Temp 98.8°F | Resp 16 | Wt 239.0 lb

## 2011-09-20 DIAGNOSIS — Z23 Encounter for immunization: Secondary | ICD-10-CM

## 2011-09-20 DIAGNOSIS — E291 Testicular hypofunction: Secondary | ICD-10-CM

## 2011-09-20 DIAGNOSIS — J45909 Unspecified asthma, uncomplicated: Secondary | ICD-10-CM

## 2011-09-20 DIAGNOSIS — R5381 Other malaise: Secondary | ICD-10-CM

## 2011-09-20 DIAGNOSIS — R202 Paresthesia of skin: Secondary | ICD-10-CM

## 2011-09-20 DIAGNOSIS — R5383 Other fatigue: Secondary | ICD-10-CM

## 2011-09-20 DIAGNOSIS — E739 Lactose intolerance, unspecified: Secondary | ICD-10-CM

## 2011-09-20 DIAGNOSIS — R209 Unspecified disturbances of skin sensation: Secondary | ICD-10-CM

## 2011-09-20 DIAGNOSIS — Z0000 Encounter for general adult medical examination without abnormal findings: Secondary | ICD-10-CM

## 2011-09-20 NOTE — Progress Notes (Signed)
Patient ID: Victor Montes, male   DOB: [birth year], 56 y.o.   MRN: [id number] Patient ID: Victor Montes, male   DOB: December 23, 1955, 56 y.o.   MRN: [id number]  Subjective:    Patient ID: Victor Montes, male    DOB: Aug 10, 1955, 56 y.o.   MRN: [id number]  HPI  The patient is here for a wellness exam. The patient has been doing well overall without major physical or psychological issues going on lately, except for fatigue. C/o occ pain in B arms and B thighs on skin off and on  The patient presents for a follow-up of  chronic hypogonadism, allergies and asthma   Review of Systems  Constitutional: Negative for appetite change, fatigue and unexpected weight change.  HENT: Negative for nosebleeds, congestion, sore throat, sneezing, trouble swallowing and neck pain.   Eyes: Negative for itching and visual disturbance.  Respiratory: Negative for cough.   Cardiovascular: Negative for chest pain, palpitations and leg swelling.  Gastrointestinal: Negative for nausea, diarrhea, blood in stool and abdominal distention.  Genitourinary: Negative for frequency and hematuria.  Musculoskeletal: Negative for back pain, joint swelling and gait problem.  Skin: Negative for rash.  Neurological: Negative for dizziness, tremors, speech difficulty and weakness.  Psychiatric/Behavioral: Negative for sleep disturbance, dysphoric mood and agitation. The patient is not nervous/anxious.    Wt Readings from Last 3 Encounters:  09/20/11 239 lb (108.41 kg)  07/05/11 236 lb (107.049 kg)  03/01/11 237 lb (107.502 kg)   BP Readings from Last 3 Encounters:  09/20/11 158/96  07/05/11 130/70  03/01/11 120/68       Objective:   Physical Exam  Constitutional: He is oriented to person, place, and time. He appears well-developed and well-nourished.  HENT:  Mouth/Throat: Oropharynx is clear and moist.  Eyes: Conjunctivae are normal. Pupils are equal, round, and reactive to light.  Neck: Normal range of motion. No JVD present.  No thyromegaly present.  Cardiovascular: Normal rate, regular rhythm, normal heart sounds and intact distal pulses.  Exam reveals no gallop and no friction rub.   No murmur heard. Pulmonary/Chest: Effort normal and breath sounds normal. No respiratory distress. He has no wheezes. He has no rales. He exhibits no tenderness.  Abdominal: Soft. Bowel sounds are normal. He exhibits no distension and no mass. There is no tenderness. There is no rebound and no guarding.  Musculoskeletal: Normal range of motion. He exhibits no edema and no tenderness.  Lymphadenopathy:    He has no cervical adenopathy.  Neurological: He is alert and oriented to person, place, and time. He has normal reflexes. No cranial nerve deficit. He exhibits normal muscle tone. Coordination normal.  Skin: Skin is warm and dry. No rash noted.  Psychiatric: He has a normal mood and affect. His behavior is normal. Judgment and thought content normal.    Lab Results  Component Value Date   WBC 9.1 09/19/2011   HGB 16.1 09/19/2011   HCT 47.4 09/19/2011   PLT 270.0 09/19/2011   GLUCOSE 102* 09/19/2011   CHOL 212* 09/19/2011   TRIG 88.0 09/19/2011   HDL 59.30 09/19/2011   LDLDIRECT 141.9 09/19/2011   ALT 31 11/25/2010   AST 22 11/25/2010   NA 143 09/19/2011   K 5.1 09/19/2011   CL 106 09/19/2011   CREATININE 0.8 09/19/2011   BUN 18 09/19/2011   CO2 28 09/19/2011   TSH 0.55 09/19/2011   PSA 0.76 09/19/2011         Assessment & Plan:

## 2011-09-20 NOTE — Assessment & Plan Note (Signed)
Labs Discussed

## 2011-09-20 NOTE — Assessment & Plan Note (Signed)
BMET 

## 2011-09-24 NOTE — Assessment & Plan Note (Signed)
Prn meds 

## 2011-09-24 NOTE — Assessment & Plan Note (Signed)
Continue with current prescription therapy as reflected on the Med list. May need to switch to shots

## 2011-09-24 NOTE — Assessment & Plan Note (Signed)
We discussed age appropriate health related issues, including available/recomended screening tests and vaccinations. We discussed a need for adhering to healthy diet and exercise. Labs/EKG were reviewed/ordered. All questions were answered.   

## 2011-12-22 ENCOUNTER — Encounter: Payer: Self-pay | Admitting: Internal Medicine

## 2011-12-22 ENCOUNTER — Ambulatory Visit (INDEPENDENT_AMBULATORY_CARE_PROVIDER_SITE_OTHER): Payer: 59 | Admitting: Internal Medicine

## 2011-12-22 VITALS — BP 138/74 | HR 60 | Temp 98.5°F | Wt 233.0 lb

## 2011-12-22 DIAGNOSIS — R0989 Other specified symptoms and signs involving the circulatory and respiratory systems: Secondary | ICD-10-CM

## 2011-12-22 DIAGNOSIS — R5381 Other malaise: Secondary | ICD-10-CM

## 2011-12-22 DIAGNOSIS — R0683 Snoring: Secondary | ICD-10-CM

## 2011-12-22 DIAGNOSIS — R0609 Other forms of dyspnea: Secondary | ICD-10-CM

## 2011-12-22 DIAGNOSIS — E291 Testicular hypofunction: Secondary | ICD-10-CM

## 2011-12-22 DIAGNOSIS — R5383 Other fatigue: Secondary | ICD-10-CM

## 2011-12-22 DIAGNOSIS — F988 Other specified behavioral and emotional disorders with onset usually occurring in childhood and adolescence: Secondary | ICD-10-CM

## 2011-12-22 MED ORDER — AMPHETAMINE-DEXTROAMPHETAMINE 10 MG PO TABS: 10.0000 mg | ORAL_TABLET | Freq: Two times a day (BID) | ORAL | Status: DC

## 2011-12-22 NOTE — Assessment & Plan Note (Signed)
Start Adderal

## 2011-12-22 NOTE — Assessment & Plan Note (Signed)
Continue with current prescription therapy as reflected on the Med list. Labs  

## 2011-12-22 NOTE — Progress Notes (Signed)
   Subjective:    Patient ID: Victor Montes, male    DOB: Feb 18, 1956, 56 y.o.   MRN: [id number]  HPI  The patient presents for a follow-up of  chronic hypogonadism, allergies and asthma C/o tiredness C/o fogginess    Review of Systems  Constitutional: Negative for appetite change, fatigue and unexpected weight change.  HENT: Negative for nosebleeds, congestion, sore throat, sneezing, trouble swallowing and neck pain.   Eyes: Negative for itching and visual disturbance.  Respiratory: Negative for cough.   Cardiovascular: Negative for chest pain, palpitations and leg swelling.  Gastrointestinal: Negative for nausea, diarrhea, blood in stool and abdominal distention.  Genitourinary: Negative for frequency and hematuria.  Musculoskeletal: Negative for back pain, joint swelling and gait problem.  Skin: Negative for rash.  Neurological: Negative for dizziness, tremors, speech difficulty and weakness.  Psychiatric/Behavioral: Negative for disturbed wake/sleep cycle, dysphoric mood and agitation. The patient is not nervous/anxious.    Wt Readings from Last 3 Encounters:  12/22/11 233 lb (105.688 kg)  09/20/11 239 lb (108.41 kg)  07/05/11 236 lb (107.049 kg)   BP Readings from Last 3 Encounters:  12/22/11 138/74  09/20/11 158/96  07/05/11 130/70       Objective:   Physical Exam  Constitutional: He is oriented to person, place, and time. He appears well-developed and well-nourished.  HENT:  Mouth/Throat: Oropharynx is clear and moist.  Eyes: Conjunctivae are normal. Pupils are equal, round, and reactive to light.  Neck: Normal range of motion. No JVD present. No thyromegaly present.  Cardiovascular: Normal rate, regular rhythm, normal heart sounds and intact distal pulses.  Exam reveals no gallop and no friction rub.   No murmur heard. Pulmonary/Chest: Effort normal and breath sounds normal. No respiratory distress. He has no wheezes. He has no rales. He exhibits no tenderness.    Abdominal: Soft. Bowel sounds are normal. He exhibits no distension and no mass. There is no tenderness. There is no rebound and no guarding.  Musculoskeletal: Normal range of motion. He exhibits no edema and no tenderness.  Lymphadenopathy:    He has no cervical adenopathy.  Neurological: He is alert and oriented to person, place, and time. He has normal reflexes. No cranial nerve deficit. He exhibits normal muscle tone. Coordination normal.  Skin: Skin is warm and dry. No rash noted.  Psychiatric: He has a normal mood and affect. His behavior is normal. Judgment and thought content normal.    Lab Results  Component Value Date   WBC 9.1 09/19/2011   HGB 16.1 09/19/2011   HCT 47.4 09/19/2011   PLT 270.0 09/19/2011   GLUCOSE 102* 09/19/2011   CHOL 212* 09/19/2011   TRIG 88.0 09/19/2011   HDL 59.30 09/19/2011   LDLDIRECT 141.9 09/19/2011   ALT 31 11/25/2010   AST 22 11/25/2010   NA 143 09/19/2011   K 5.1 09/19/2011   CL 106 09/19/2011   CREATININE 0.8 09/19/2011   BUN 18 09/19/2011   CO2 28 09/19/2011   TSH 0.55 09/19/2011   PSA 0.76 09/19/2011         Assessment & Plan:

## 2011-12-24 NOTE — Assessment & Plan Note (Signed)
R/o OSA Labs

## 2011-12-25 ENCOUNTER — Telehealth: Payer: Self-pay | Admitting: *Deleted

## 2011-12-25 NOTE — Telephone Encounter (Signed)
Rf req for Axiron 30 mg/ actuation. Apply 2 pumps qam. Last filled 11/21/11. Ok to Rf?

## 2011-12-25 NOTE — Telephone Encounter (Signed)
OK to fill this prescription with additional refills x5 Thank you!  

## 2011-12-26 MED ORDER — TESTOSTERONE 30 MG/ACT TD SOLN: 60.0000 mg | TRANSDERMAL | Status: DC

## 2011-12-26 NOTE — Telephone Encounter (Signed)
Done

## 2011-12-28 ENCOUNTER — Telehealth: Payer: Self-pay | Admitting: Internal Medicine

## 2011-12-28 ENCOUNTER — Other Ambulatory Visit (INDEPENDENT_AMBULATORY_CARE_PROVIDER_SITE_OTHER): Payer: 59

## 2011-12-28 DIAGNOSIS — E739 Lactose intolerance, unspecified: Secondary | ICD-10-CM

## 2011-12-28 DIAGNOSIS — R202 Paresthesia of skin: Secondary | ICD-10-CM

## 2011-12-28 DIAGNOSIS — Z0000 Encounter for general adult medical examination without abnormal findings: Secondary | ICD-10-CM

## 2011-12-28 DIAGNOSIS — R5381 Other malaise: Secondary | ICD-10-CM

## 2011-12-28 DIAGNOSIS — R209 Unspecified disturbances of skin sensation: Secondary | ICD-10-CM

## 2011-12-28 DIAGNOSIS — R5383 Other fatigue: Secondary | ICD-10-CM

## 2011-12-28 LAB — BASIC METABOLIC PANEL
BUN: 11 mg/dL (ref 6–23)
Chloride: 102 mEq/L (ref 96–112)
Creatinine, Ser: 0.7 mg/dL (ref 0.4–1.5)
Glucose, Bld: 96 mg/dL (ref 70–99)
Potassium: 4.6 mEq/L (ref 3.5–5.1)

## 2011-12-28 NOTE — Telephone Encounter (Signed)
Victor Montes, please, inform patient that all labs are normal except for low testost I would start IM injections, if he is willing. Is it ok for me to call in inj test? - he will need to come for the 1st inj here Thx

## 2011-12-29 MED ORDER — TESTOSTERONE CYPIONATE 200 MG/ML IM SOLN: 400.0000 mg | INTRAMUSCULAR | Status: DC

## 2011-12-29 NOTE — Telephone Encounter (Signed)
Ok testost IM - see rx Thx

## 2011-12-29 NOTE — Telephone Encounter (Signed)
Done

## 2011-12-29 NOTE — Telephone Encounter (Signed)
Pt informed. He agrees to testosterone inj. Please advise.

## 2012-01-08 ENCOUNTER — Ambulatory Visit (HOSPITAL_BASED_OUTPATIENT_CLINIC_OR_DEPARTMENT_OTHER): Payer: 59 | Attending: Internal Medicine

## 2012-01-08 DIAGNOSIS — R0683 Snoring: Secondary | ICD-10-CM

## 2012-01-08 DIAGNOSIS — G4733 Obstructive sleep apnea (adult) (pediatric): Secondary | ICD-10-CM | POA: Insufficient documentation

## 2012-01-08 DIAGNOSIS — R5383 Other fatigue: Secondary | ICD-10-CM

## 2012-01-10 ENCOUNTER — Telehealth: Payer: Self-pay | Admitting: *Deleted

## 2012-01-10 NOTE — Telephone Encounter (Signed)
PA approved x 1 year. Pharmacy informed. Left detailed mess informing pt.

## 2012-01-10 NOTE — Telephone Encounter (Signed)
PA form for depo testosterone faxed to above. Will hold phone note for ins response.

## 2012-01-18 ENCOUNTER — Ambulatory Visit (INDEPENDENT_AMBULATORY_CARE_PROVIDER_SITE_OTHER): Payer: 59 | Admitting: General Practice

## 2012-01-18 ENCOUNTER — Telehealth: Payer: Self-pay | Admitting: Internal Medicine

## 2012-01-18 DIAGNOSIS — E291 Testicular hypofunction: Secondary | ICD-10-CM

## 2012-01-18 MED ORDER — "SYRINGE/NEEDLE (DISP) 25G X 1"" 3 ML MISC": Status: DC

## 2012-01-18 MED ORDER — AMPHETAMINE-DEXTROAMPHETAMINE 10 MG PO TABS: 10.0000 mg | ORAL_TABLET | Freq: Two times a day (BID) | ORAL | Status: DC

## 2012-01-18 MED ORDER — TESTOSTERONE CYPIONATE 200 MG/ML IM SOLN
200.0000 mg | INTRAMUSCULAR | Status: DC
  Administered 2012-01-18: 200 mg via INTRAMUSCULAR

## 2012-01-18 NOTE — Telephone Encounter (Signed)
Ok done Thx 

## 2012-01-18 NOTE — Telephone Encounter (Signed)
Pt stated during nurse visit that he is going to be giving himself testosterone injections, needs a prescription for syringes and needles. Also stated ADDERALL is working and would like a refill.

## 2012-01-19 NOTE — Telephone Encounter (Signed)
Notified pt rx's ready for pick=up.../LMB 

## 2012-01-20 DIAGNOSIS — G4733 Obstructive sleep apnea (adult) (pediatric): Secondary | ICD-10-CM

## 2012-02-01 ENCOUNTER — Ambulatory Visit: Payer: 59 | Admitting: *Deleted

## 2012-02-01 DIAGNOSIS — E291 Testicular hypofunction: Secondary | ICD-10-CM

## 2012-02-01 MED ORDER — TESTOSTERONE CYPIONATE 200 MG/ML IM SOLN
400.0000 mg | Freq: Once | INTRAMUSCULAR | Status: AC
  Administered 2012-02-01: 400 mg via INTRAMUSCULAR

## 2012-02-01 NOTE — Progress Notes (Signed)
Pt here for nurse visit. Testosterone inj. He gave himself 2 ml testost 200mg /ml in his right upper thigh and did well. He states he will be giving himself the injections at home now.

## 2012-02-12 ENCOUNTER — Telehealth: Payer: Self-pay | Admitting: *Deleted

## 2012-02-12 DIAGNOSIS — G4733 Obstructive sleep apnea (adult) (pediatric): Secondary | ICD-10-CM

## 2012-02-12 NOTE — Telephone Encounter (Signed)
Left mess for patient to call back re: sleep study results.

## 2012-02-12 NOTE — Telephone Encounter (Signed)
Done. Thx.

## 2012-02-12 NOTE — Telephone Encounter (Signed)
Pt informed of sleep study results. He needs referral to Dr. Maple Hudson.

## 2012-02-19 ENCOUNTER — Other Ambulatory Visit: Payer: Self-pay

## 2012-02-21 MED ORDER — AMPHETAMINE-DEXTROAMPHETAMINE 10 MG PO TABS: 10.0000 mg | ORAL_TABLET | Freq: Two times a day (BID) | ORAL | Status: DC

## 2012-03-01 ENCOUNTER — Other Ambulatory Visit: Payer: Self-pay | Admitting: Dermatology

## 2012-03-25 ENCOUNTER — Ambulatory Visit: Payer: 59 | Admitting: Internal Medicine

## 2012-03-25 DIAGNOSIS — Z0289 Encounter for other administrative examinations: Secondary | ICD-10-CM

## 2012-03-26 ENCOUNTER — Encounter: Payer: Self-pay | Admitting: Internal Medicine

## 2012-03-26 ENCOUNTER — Ambulatory Visit (INDEPENDENT_AMBULATORY_CARE_PROVIDER_SITE_OTHER): Payer: 59 | Admitting: Internal Medicine

## 2012-03-26 ENCOUNTER — Other Ambulatory Visit: Payer: Self-pay | Admitting: *Deleted

## 2012-03-26 VITALS — BP 150/90 | HR 64 | Ht 78.0 in | Wt 242.0 lb

## 2012-03-26 DIAGNOSIS — G4733 Obstructive sleep apnea (adult) (pediatric): Secondary | ICD-10-CM

## 2012-03-26 DIAGNOSIS — F17200 Nicotine dependence, unspecified, uncomplicated: Secondary | ICD-10-CM

## 2012-03-26 DIAGNOSIS — Z720 Tobacco use: Secondary | ICD-10-CM

## 2012-03-26 MED ORDER — AMPHETAMINE-DEXTROAMPHETAMINE 10 MG PO TABS: 10.0000 mg | ORAL_TABLET | Freq: Two times a day (BID) | ORAL | Status: DC

## 2012-03-26 NOTE — Progress Notes (Signed)
03/26/12- 33 yoM smoker referred courtesy of Dr Posey Rea  for sleep medicine evaluation because of  obstructive sleep apnea. "Alice" unattended home sleep study on 01/10/2012-AHI 14.3 per hour . He was diagnosed with obstructive sleep apnea and had UPPP surgery/ Dr C. Newman 15 years ago. He is told that he snores loudly. He manages daytime sleepiness with adderall 10 mg twice daily which helps him at work. Bedtime around 9:30 PM, sleep latency 10-15 minutes, not aware of waking during the night before he wakes spontaneously around 3 AM. This is his long-standing sleep habit. He denies history of thyroid or cardiopulmonary disease. He smoked one half pack per day for many years but says now he just smokes one cigarette per week. He is unmarried, working as a Therapist, music.  Prior to Admission medications   Medication Sig Start Date End Date Taking? Authorizing Provider  albuterol (PROAIR HFA) 108 (90 BASE) MCG/ACT inhaler Inhale 2 puffs into the lungs 4 (four) times daily as needed.     Yes Historical Provider, MD  cholecalciferol (VITAMIN D) 1000 UNITS tablet Take 1,000 Units by mouth daily.     Yes Historical Provider, MD  psyllium (REGULOID) 0.52 G capsule Take 1.04 g by mouth daily.     Yes Historical Provider, MD  SYRINGE-NEEDLE, DISP, 3 ML (BD ECLIPSE SYRINGE) 25G X 1" 3 ML MISC [place] as dirrected for IM injections 01/18/12  Yes Georgina Quint Plotnikov, MD  amphetamine-dextroamphetamine (ADDERALL) 10 MG tablet Take 1 tablet (10 mg total) by mouth 2 (two) times daily. 03/26/12   Tresa Garter, MD   Past Medical History  Diagnosis Date  . Asthma   . ADD (attention deficit disorder)   . Hyperlipidemia   . Hypogonadism male   . Allergic rhinitis   . Glucose intolerance (impaired glucose tolerance)   . OSA (obstructive sleep apnea)   . Cervical radiculopathy 2007    Right- Dr. Gerlene Fee   . Constipation 2011  . Occipital neuralgia 2011    Left   Past Surgical History  Procedure Date  .  Uvulopalatoplasty     s/p   Family History  Problem Relation Age of Onset  . Colon cancer Mother   . Cancer Mother 61    colon ca  . Coronary artery disease Neg Hx   . Hypertension Other   . Stroke Father   . Heart disease Father     A fib   History   Social History  . Marital Status: Single    Spouse Name: N/A    Number of Children: N/A  . Years of Education: N/A   Occupational History  . Not on file.   Social History Main Topics  . Smoking status: Current Some Day Smoker -- 0.1 packs/day for 20 years    Types: Cigarettes  . Smokeless tobacco: Not on file  . Alcohol Use: Yes  . Drug Use: No  . Sexually Active: Not on file   Other Topics Concern  . Not on file   Social History Narrative  . No narrative on file   ROS-see HPI Constitutional:   No-   weight loss, night sweats, fevers, chills, +fatigue, lassitude. HEENT:   No-  headaches, difficulty swallowing, tooth/dental problems, sore throat,       No-  sneezing, itching, ear ache, nasal congestion, post nasal drip,  CV:  No-   chest pain, orthopnea, PND, swelling in lower extremities, anasarca, dizziness, palpitations Resp: No-   shortness of breath with exertion  or at rest.              No-   productive cough,  No non-productive cough,  No- coughing up of blood.              No-   change in color of mucus.  No- wheezing.   Skin: No-   rash or lesions. GI:  No-   heartburn, indigestion, abdominal pain, nausea, vomiting, diarrhea,                 change in bowel habits, loss of appetite GU: No-   dysuria, change in color of urine, no urgency or frequency.  No- flank pain. MS:  No-   joint pain or swelling.  No- decreased range of motion.  No- back pain. Neuro-     nothing unusual Psych:  No- change in mood or affect. No depression or anxiety.  No memory loss.  OBJ- Physical Exam General- Alert, Oriented, Affect-appropriate, Distress- none acute. Tall, medium build, odor tobacco Skin- rash-none, lesions- none,  excoriation- none Lymphadenopathy- none Head- atraumatic            Eyes- Gross vision intact, PERRLA, conjunctivae and secretions clear            Ears- Hearing, canals-normal            Nose- Clear, no-Septal dev, mucus, polyps, erosion, perforation             Throat- Mallampati IV , mucosa clear , drainage- none, tonsils- atrophic Neck- flexible , trachea midline, no stridor , thyroid nl, carotid no bruit Chest - symmetrical excursion , unlabored           Heart/CV- RRR , no murmur , no gallop  , no rub, nl s1 s2                           - JVD- none , edema- none, stasis changes- none, varices- none           Lung- clear to P&A, wheeze- none, cough- none , dullness-none, rub- none           Chest wall-  Abd- tender-no, distended-no, bowel sounds-present, HSM- no Br/ Gen/ Rectal- Not done, not indicated Extrem- cyanosis- none, clubbing, none, atrophy- none, strength- nl Neuro- grossly intact to observation

## 2012-03-26 NOTE — Patient Instructions (Addendum)
Order- New CPAP autotitrate for pressure recommendation 5-20 cwp x 7 days, mask of choice, humidifier, supplies     Dx OSA

## 2012-04-07 DIAGNOSIS — G4733 Obstructive sleep apnea (adult) (pediatric): Secondary | ICD-10-CM | POA: Insufficient documentation

## 2012-04-07 NOTE — Assessment & Plan Note (Signed)
From tobacco odor noticed in the exam room at this visit,  he either smoked just before he walked in the door,  or he is smoking more than one cigarette per week. I strongly recommended that he stop completely

## 2012-04-07 NOTE — Assessment & Plan Note (Signed)
Educated on basic physiology of sleep apnea, importance of weight control, good sleep hygiene and his responsibility to drive safely  and treatment options discussed. Plan-start CPAP

## 2012-04-25 ENCOUNTER — Telehealth: Payer: Self-pay

## 2012-04-25 NOTE — Telephone Encounter (Signed)
Pt called requesting refill of Adderall.  

## 2012-04-26 MED ORDER — AMPHETAMINE-DEXTROAMPHETAMINE 10 MG PO TABS: 10.0000 mg | ORAL_TABLET | Freq: Two times a day (BID) | ORAL | Status: DC

## 2012-04-26 NOTE — Telephone Encounter (Signed)
Pt informed, Rx in cabinet for pt pick up  

## 2012-04-26 NOTE — Telephone Encounter (Signed)
OK to fill this prescription with additional refills x0 Thank you!  

## 2012-04-26 NOTE — Telephone Encounter (Signed)
Rx printed and placed on counter for MD signature.  

## 2012-05-21 ENCOUNTER — Ambulatory Visit: Payer: 59 | Admitting: Internal Medicine

## 2012-05-28 ENCOUNTER — Telehealth: Payer: Self-pay | Admitting: Internal Medicine

## 2012-05-28 NOTE — Telephone Encounter (Signed)
Patient is requesting a refill on his adderal, call when ready for pick up

## 2012-05-28 NOTE — Telephone Encounter (Signed)
OK to fill this prescription with additional refills x0 Thank you!  

## 2012-05-29 MED ORDER — AMPHETAMINE-DEXTROAMPHETAMINE 10 MG PO TABS: 10.0000 mg | ORAL_TABLET | Freq: Two times a day (BID) | ORAL | Status: DC

## 2012-05-29 NOTE — Telephone Encounter (Signed)
Pt notified to pick up//LMOVM. 

## 2012-07-04 ENCOUNTER — Other Ambulatory Visit: Payer: Self-pay | Admitting: Internal Medicine

## 2012-07-06 NOTE — Telephone Encounter (Signed)
Received refill request from [facility] on  TESTOSTERONE CYPIONATE 200 MG/ML IM OIL Please Advise refill

## 2012-07-12 ENCOUNTER — Other Ambulatory Visit: Payer: Self-pay | Admitting: Internal Medicine

## 2012-07-12 NOTE — Telephone Encounter (Signed)
Ok x 2 mo  RTC 2 mo Thx

## 2012-07-12 NOTE — Telephone Encounter (Signed)
Patient requesting refill on Adderall, call when ready for pick up

## 2012-07-16 NOTE — Telephone Encounter (Signed)
Left detailed mess informing pt Rxs will be ready. I need to know what "fill on or after date" should be.

## 2012-07-18 MED ORDER — AMPHETAMINE-DEXTROAMPHETAMINE 10 MG PO TABS: 10.0000 mg | ORAL_TABLET | Freq: Two times a day (BID) | ORAL | Status: DC

## 2012-07-18 NOTE — Telephone Encounter (Signed)
Rx printed/pending MD sig. Pt informed Rx will be ready tom AM.

## 2012-07-19 ENCOUNTER — Other Ambulatory Visit: Payer: Self-pay | Admitting: Internal Medicine

## 2012-08-26 ENCOUNTER — Other Ambulatory Visit: Payer: Self-pay

## 2012-08-27 MED ORDER — AMPHETAMINE-DEXTROAMPHETAMINE 10 MG PO TABS: 10.0000 mg | ORAL_TABLET | Freq: Two times a day (BID) | ORAL | Status: DC

## 2012-08-27 NOTE — Telephone Encounter (Signed)
Pt advised via personal VM - Appointment is required for continued medication refill

## 2012-08-27 NOTE — Telephone Encounter (Signed)
Needs ov

## 2012-09-13 ENCOUNTER — Ambulatory Visit (INDEPENDENT_AMBULATORY_CARE_PROVIDER_SITE_OTHER): Payer: 59 | Admitting: Internal Medicine

## 2012-09-13 ENCOUNTER — Encounter: Payer: Self-pay | Admitting: Internal Medicine

## 2012-09-13 VITALS — BP 140/88 | HR 80 | Temp 97.9°F | Resp 16 | Wt 235.0 lb

## 2012-09-13 DIAGNOSIS — J45909 Unspecified asthma, uncomplicated: Secondary | ICD-10-CM

## 2012-09-13 DIAGNOSIS — E291 Testicular hypofunction: Secondary | ICD-10-CM

## 2012-09-13 DIAGNOSIS — R5383 Other fatigue: Secondary | ICD-10-CM

## 2012-09-13 DIAGNOSIS — Z0000 Encounter for general adult medical examination without abnormal findings: Secondary | ICD-10-CM

## 2012-09-13 DIAGNOSIS — R5381 Other malaise: Secondary | ICD-10-CM

## 2012-09-13 DIAGNOSIS — F988 Other specified behavioral and emotional disorders with onset usually occurring in childhood and adolescence: Secondary | ICD-10-CM

## 2012-09-13 DIAGNOSIS — G4733 Obstructive sleep apnea (adult) (pediatric): Secondary | ICD-10-CM

## 2012-09-13 MED ORDER — ALBUTEROL SULFATE HFA 108 (90 BASE) MCG/ACT IN AERS: 2.0000 | INHALATION_SPRAY | Freq: Four times a day (QID) | RESPIRATORY_TRACT | Status: DC | PRN

## 2012-09-13 MED ORDER — AMPHETAMINE-DEXTROAMPHETAMINE 10 MG PO TABS
10.0000 mg | ORAL_TABLET | Freq: Two times a day (BID) | ORAL | Status: DC
Start: 2012-09-13 — End: 2012-09-13

## 2012-09-13 MED ORDER — AMPHETAMINE-DEXTROAMPHETAMINE 10 MG PO TABS: 10.0000 mg | ORAL_TABLET | Freq: Two times a day (BID) | ORAL | Status: DC

## 2012-09-13 NOTE — Assessment & Plan Note (Signed)
Continue with current prescription therapy as reflected on the Med list.  

## 2012-09-13 NOTE — Assessment & Plan Note (Signed)
CPAP.  

## 2012-09-13 NOTE — Progress Notes (Signed)
Patient ID: Victor Montes, male   DOB: 1956/02/05, 57 y.o.   MRN: [id number]   Subjective:    Patient ID: Victor Montes, male    DOB: 16-Mar-1956, 57 y.o.   MRN: [id number]  HPI  The patient presents for a follow-up of  chronic hypogonadism, allergies and asthma F/u tiredness F/u fogginess - better    Review of Systems  Constitutional: Negative for appetite change, fatigue and unexpected weight change.  HENT: Negative for nosebleeds, congestion, sore throat, sneezing, trouble swallowing and neck pain.   Eyes: Negative for itching and visual disturbance.  Respiratory: Negative for cough.   Cardiovascular: Negative for chest pain, palpitations and leg swelling.  Gastrointestinal: Negative for nausea, diarrhea, blood in stool and abdominal distention.  Genitourinary: Negative for frequency and hematuria.  Musculoskeletal: Negative for back pain, joint swelling and gait problem.  Skin: Negative for rash.  Neurological: Negative for dizziness, tremors, speech difficulty and weakness.  Psychiatric/Behavioral: Negative for sleep disturbance, dysphoric mood and agitation. The patient is not nervous/anxious.    Wt Readings from Last 3 Encounters:  09/13/12 235 lb (106.595 kg)  03/26/12 242 lb (109.77 kg)  12/22/11 233 lb (105.688 kg)   BP Readings from Last 3 Encounters:  09/13/12 140/88  03/26/12 150/90  12/22/11 138/74       Objective:   Physical Exam  Constitutional: He is oriented to person, place, and time. He appears well-developed and well-nourished.  HENT:  Mouth/Throat: Oropharynx is clear and moist.  Eyes: Conjunctivae are normal. Pupils are equal, round, and reactive to light.  Neck: Normal range of motion. No JVD present. No thyromegaly present.  Cardiovascular: Normal rate, regular rhythm, normal heart sounds and intact distal pulses.  Exam reveals no gallop and no friction rub.   No murmur heard. Pulmonary/Chest: Effort normal and breath sounds normal. No respiratory  distress. He has no wheezes. He has no rales. He exhibits no tenderness.  Abdominal: Soft. Bowel sounds are normal. He exhibits no distension and no mass. There is no tenderness. There is no rebound and no guarding.  Musculoskeletal: Normal range of motion. He exhibits no edema and no tenderness.  Lymphadenopathy:    He has no cervical adenopathy.  Neurological: He is alert and oriented to person, place, and time. He has normal reflexes. No cranial nerve deficit. He exhibits normal muscle tone. Coordination normal.  Skin: Skin is warm and dry. No rash noted.  Psychiatric: He has a normal mood and affect. His behavior is normal. Judgment and thought content normal.    Lab Results  Component Value Date   WBC 9.1 09/19/2011   HGB 16.1 09/19/2011   HCT 47.4 09/19/2011   PLT 270.0 09/19/2011   GLUCOSE 96 12/28/2011   CHOL 212* 09/19/2011   TRIG 88.0 09/19/2011   HDL 59.30 09/19/2011   LDLDIRECT 141.9 09/19/2011   ALT 31 11/25/2010   AST 22 11/25/2010   NA 138 12/28/2011   K 4.6 12/28/2011   CL 102 12/28/2011   CREATININE 0.7 12/28/2011   BUN 11 12/28/2011   CO2 28 12/28/2011   TSH 0.55 09/19/2011   PSA 0.76 09/19/2011         Assessment & Plan:

## 2012-09-13 NOTE — Assessment & Plan Note (Signed)
Better  

## 2012-11-29 ENCOUNTER — Telehealth: Payer: Self-pay | Admitting: *Deleted

## 2012-11-29 MED ORDER — AMPHETAMINE-DEXTROAMPHETAMINE 10 MG PO TABS: 10.0000 mg | ORAL_TABLET | Freq: Two times a day (BID) | ORAL | Status: DC

## 2012-11-29 NOTE — Telephone Encounter (Signed)
Pt called requesting refill on Xanax, last OV 4.25.2014.  Rx printed for MD signature.

## 2012-11-29 NOTE — Telephone Encounter (Signed)
rx complete.

## 2012-11-29 NOTE — Telephone Encounter (Signed)
OK to fill this prescription with additional refills x2 Thank you!  

## 2013-01-02 ENCOUNTER — Ambulatory Visit (INDEPENDENT_AMBULATORY_CARE_PROVIDER_SITE_OTHER): Payer: 59

## 2013-01-02 DIAGNOSIS — R5383 Other fatigue: Secondary | ICD-10-CM

## 2013-01-02 DIAGNOSIS — R5381 Other malaise: Secondary | ICD-10-CM

## 2013-01-02 DIAGNOSIS — G4733 Obstructive sleep apnea (adult) (pediatric): Secondary | ICD-10-CM

## 2013-01-02 DIAGNOSIS — Z0000 Encounter for general adult medical examination without abnormal findings: Secondary | ICD-10-CM

## 2013-01-02 DIAGNOSIS — E291 Testicular hypofunction: Secondary | ICD-10-CM

## 2013-01-02 DIAGNOSIS — F988 Other specified behavioral and emotional disorders with onset usually occurring in childhood and adolescence: Secondary | ICD-10-CM

## 2013-01-02 DIAGNOSIS — J45909 Unspecified asthma, uncomplicated: Secondary | ICD-10-CM

## 2013-01-02 LAB — CBC WITH DIFFERENTIAL/PLATELET
Basophils Absolute: 0 10*3/uL (ref 0.0–0.1)
HCT: 45.7 % (ref 39.0–52.0)
Hemoglobin: 15.4 g/dL (ref 13.0–17.0)
Lymphs Abs: 2 10*3/uL (ref 0.7–4.0)
Monocytes Relative: 9.3 % (ref 3.0–12.0)
Neutro Abs: 6.4 10*3/uL (ref 1.4–7.7)
RDW: 13.6 % (ref 11.5–14.6)

## 2013-01-02 LAB — TSH: TSH: 0.39 u[IU]/mL (ref 0.35–5.50)

## 2013-01-02 LAB — LDL CHOLESTEROL, DIRECT: Direct LDL: 107.9 mg/dL

## 2013-01-02 LAB — BASIC METABOLIC PANEL
CO2: 30 mEq/L (ref 19–32)
Chloride: 100 mEq/L (ref 96–112)
GFR: 75.11 mL/min (ref >60.00)
Glucose, Bld: 129 mg/dL — ABNORMAL HIGH (ref 70–99)
Potassium: 4.8 mEq/L (ref 3.5–5.1)
Sodium: 138 mEq/L (ref 135–145)

## 2013-01-02 LAB — HEPATIC FUNCTION PANEL
Bilirubin, Direct: 0.1 mg/dL (ref 0.0–0.3)
Total Bilirubin: 0.7 mg/dL (ref 0.3–1.2)

## 2013-01-02 LAB — LIPID PANEL
HDL: 37.6 mg/dL — ABNORMAL LOW (ref >39.00)
VLDL: 42.2 mg/dL — ABNORMAL HIGH (ref 0.0–40.0)

## 2013-01-03 ENCOUNTER — Ambulatory Visit (INDEPENDENT_AMBULATORY_CARE_PROVIDER_SITE_OTHER): Payer: 59 | Admitting: Internal Medicine

## 2013-01-03 ENCOUNTER — Encounter: Payer: Self-pay | Admitting: Internal Medicine

## 2013-01-03 VITALS — BP 142/68 | HR 64 | Temp 97.7°F | Resp 16 | Wt 230.0 lb

## 2013-01-03 DIAGNOSIS — E785 Hyperlipidemia, unspecified: Secondary | ICD-10-CM

## 2013-01-03 DIAGNOSIS — F988 Other specified behavioral and emotional disorders with onset usually occurring in childhood and adolescence: Secondary | ICD-10-CM

## 2013-01-03 DIAGNOSIS — E291 Testicular hypofunction: Secondary | ICD-10-CM

## 2013-01-03 DIAGNOSIS — J45909 Unspecified asthma, uncomplicated: Secondary | ICD-10-CM

## 2013-01-03 LAB — URINALYSIS
Bilirubin Urine: NEGATIVE
Ketones, ur: NEGATIVE
Leukocytes, UA: NEGATIVE
Specific Gravity, Urine: 1.015 (ref 1.000–1.030)
Total Protein, Urine: NEGATIVE
Urine Glucose: NEGATIVE
pH: 6.5 (ref 5.0–8.0)

## 2013-01-03 MED ORDER — AMPHETAMINE-DEXTROAMPHETAMINE 10 MG PO TABS: 10.0000 mg | ORAL_TABLET | Freq: Two times a day (BID) | ORAL | Status: DC

## 2013-01-03 MED ORDER — TESTOSTERONE CYPIONATE 200 MG/ML IM SOLN: INTRAMUSCULAR | Status: DC

## 2013-01-03 NOTE — Progress Notes (Signed)
   Subjective:     HPI  The patient presents for a follow-up of  chronic hypogonadism, allergies and asthma  F/u tiredness  F/u fogginess - better    Review of Systems  Constitutional: Negative for appetite change, fatigue and unexpected weight change.  HENT: Negative for nosebleeds, congestion, sore throat, sneezing, trouble swallowing and neck pain.   Eyes: Negative for itching and visual disturbance.  Respiratory: Negative for cough.   Cardiovascular: Negative for chest pain, palpitations and leg swelling.  Gastrointestinal: Negative for nausea, diarrhea, blood in stool and abdominal distention.  Genitourinary: Negative for frequency and hematuria.  Musculoskeletal: Negative for back pain, joint swelling and gait problem.  Skin: Negative for rash.  Neurological: Negative for dizziness, tremors, speech difficulty and weakness.  Psychiatric/Behavioral: Negative for sleep disturbance, dysphoric mood and agitation. The patient is not nervous/anxious.    Wt Readings from Last 3 Encounters:  01/03/13 230 lb (104.327 kg)  09/13/12 235 lb (106.595 kg)  03/26/12 242 lb (109.77 kg)   BP Readings from Last 3 Encounters:  01/03/13 142/68  09/13/12 140/88  03/26/12 150/90       Objective:   Physical Exam  Constitutional: He is oriented to person, place, and time. He appears well-developed and well-nourished.  HENT:  Mouth/Throat: Oropharynx is clear and moist.  Eyes: Conjunctivae are normal. Pupils are equal, round, and reactive to light.  Neck: Normal range of motion. No JVD present. No thyromegaly present.  Cardiovascular: Normal rate, regular rhythm, normal heart sounds and intact distal pulses.  Exam reveals no gallop and no friction rub.   No murmur heard. Pulmonary/Chest: Effort normal and breath sounds normal. No respiratory distress. He has no wheezes. He has no rales. He exhibits no tenderness.  Abdominal: Soft. Bowel sounds are normal. He exhibits no distension and no  mass. There is no tenderness. There is no rebound and no guarding.  Musculoskeletal: Normal range of motion. He exhibits no edema and no tenderness.  Lymphadenopathy:    He has no cervical adenopathy.  Neurological: He is alert and oriented to person, place, and time. He has normal reflexes. No cranial nerve deficit. He exhibits normal muscle tone. Coordination normal.  Skin: Skin is warm and dry. No rash noted.  Psychiatric: He has a normal mood and affect. His behavior is normal. Judgment and thought content normal.    Lab Results  Component Value Date   WBC 9.5 01/02/2013   HGB 15.4 01/02/2013   HCT 45.7 01/02/2013   PLT 299.0 01/02/2013   GLUCOSE 129* 01/02/2013   CHOL 172 01/02/2013   TRIG 211.0* 01/02/2013   HDL 37.60* 01/02/2013   LDLDIRECT 107.9 01/02/2013   ALT 27 01/02/2013   AST 21 01/02/2013   NA 138 01/02/2013   K 4.8 01/02/2013   CL 100 01/02/2013   CREATININE 1.1 01/02/2013   BUN 20 01/02/2013   CO2 30 01/02/2013   TSH 0.39 01/02/2013   PSA 0.85 01/02/2013   HGBA1C 5.5 01/02/2013         Assessment & Plan:

## 2013-01-03 NOTE — Assessment & Plan Note (Signed)
Continue with current prescription therapy as reflected on the Med list.  

## 2013-01-03 NOTE — Assessment & Plan Note (Signed)
Watching labs Diet

## 2013-01-03 NOTE — Assessment & Plan Note (Signed)
Gluten free trial (no wheat products) for 4-6 weeks. OK to use gluten-free bread and gluten-free pasta.  Milk free trial (no milk, ice cream, cheese and yogurt) for 4-6 weeks. OK to use almond, coconut, rice or soy milk.  

## 2013-01-03 NOTE — Patient Instructions (Addendum)
Gluten free trial (no wheat products) for 4-6 weeks. OK to use gluten-free bread and gluten-free pasta.  Milk free trial (no milk, ice cream, cheese and yogurt) for 4-6 weeks. OK to use almond, coconut, rice or soy milk.  

## 2013-01-21 ENCOUNTER — Telehealth: Payer: Self-pay | Admitting: *Deleted

## 2013-01-21 NOTE — Telephone Encounter (Signed)
Prior auth for adderall 10mg  initiated & approved over the phone for 9.2.2014 through 9.2.2015.

## 2013-01-23 ENCOUNTER — Telehealth: Payer: Self-pay | Admitting: *Deleted

## 2013-01-23 NOTE — Telephone Encounter (Signed)
Testosterone 200 mg/ml PA is approved from 01/23/13 until 01/24/16 per Neysa Bonito at [facility]acy informed.

## 2013-02-28 ENCOUNTER — Other Ambulatory Visit: Payer: Self-pay | Admitting: Internal Medicine

## 2013-03-27 ENCOUNTER — Other Ambulatory Visit: Payer: Self-pay

## 2013-04-07 ENCOUNTER — Ambulatory Visit (INDEPENDENT_AMBULATORY_CARE_PROVIDER_SITE_OTHER): Payer: 59 | Admitting: Internal Medicine

## 2013-04-07 ENCOUNTER — Encounter: Payer: Self-pay | Admitting: Internal Medicine

## 2013-04-07 VITALS — BP 140/90 | HR 80 | Temp 98.2°F | Resp 16 | Wt 217.0 lb

## 2013-04-07 DIAGNOSIS — F988 Other specified behavioral and emotional disorders with onset usually occurring in childhood and adolescence: Secondary | ICD-10-CM

## 2013-04-07 DIAGNOSIS — E739 Lactose intolerance, unspecified: Secondary | ICD-10-CM

## 2013-04-07 DIAGNOSIS — E291 Testicular hypofunction: Secondary | ICD-10-CM

## 2013-04-07 DIAGNOSIS — R5381 Other malaise: Secondary | ICD-10-CM

## 2013-04-07 DIAGNOSIS — J45909 Unspecified asthma, uncomplicated: Secondary | ICD-10-CM

## 2013-04-07 DIAGNOSIS — G4733 Obstructive sleep apnea (adult) (pediatric): Secondary | ICD-10-CM

## 2013-04-07 DIAGNOSIS — R5383 Other fatigue: Secondary | ICD-10-CM

## 2013-04-07 DIAGNOSIS — R634 Abnormal weight loss: Secondary | ICD-10-CM

## 2013-04-07 DIAGNOSIS — R3915 Urgency of urination: Secondary | ICD-10-CM | POA: Insufficient documentation

## 2013-04-07 MED ORDER — SILODOSIN 8 MG PO CAPS: 8.0000 mg | ORAL_CAPSULE | Freq: Every day | ORAL | Status: DC

## 2013-04-07 MED ORDER — AMPHETAMINE-DEXTROAMPHETAMINE 10 MG PO TABS: 10.0000 mg | ORAL_TABLET | Freq: Two times a day (BID) | ORAL | Status: DC

## 2013-04-07 NOTE — Patient Instructions (Signed)
Wt Readings from Last 3 Encounters:  04/07/13 217 lb (98.431 kg)  01/03/13 230 lb (104.327 kg)  09/13/12 235 lb (106.595 kg)

## 2013-04-07 NOTE — Progress Notes (Signed)
   Subjective:     HPI C/o urgency x 3 wks - new The patient presents for a follow-up of  chronic hypogonadism, allergies and asthma  F/u tiredness -better  F/u fogginess - better  Wt Readings from Last 3 Encounters:  04/07/13 217 lb (98.431 kg)  01/03/13 230 lb (104.327 kg)  09/13/12 235 lb (106.595 kg)       Review of Systems  Constitutional: Negative for appetite change, fatigue and unexpected weight change.  HENT: Negative for congestion, nosebleeds, sneezing, sore throat and trouble swallowing.   Eyes: Negative for itching and visual disturbance.  Respiratory: Negative for cough.   Cardiovascular: Negative for chest pain, palpitations and leg swelling.  Gastrointestinal: Negative for nausea, diarrhea, blood in stool and abdominal distention.  Genitourinary: Negative for frequency and hematuria.  Musculoskeletal: Negative for back pain, gait problem, joint swelling and neck pain.  Skin: Negative for rash.  Neurological: Negative for dizziness, tremors, speech difficulty and weakness.  Psychiatric/Behavioral: Negative for sleep disturbance, dysphoric mood and agitation. The patient is not nervous/anxious.    Wt Readings from Last 3 Encounters:  04/07/13 217 lb (98.431 kg)  01/03/13 230 lb (104.327 kg)  09/13/12 235 lb (106.595 kg)   BP Readings from Last 3 Encounters:  04/07/13 140/90  01/03/13 142/68  09/13/12 140/88       Objective:   Physical Exam  Constitutional: He is oriented to person, place, and time. He appears well-developed and well-nourished.  HENT:  Mouth/Throat: Oropharynx is clear and moist.  Eyes: Conjunctivae are normal. Pupils are equal, round, and reactive to light.  Neck: Normal range of motion. No JVD present. No thyromegaly present.  Cardiovascular: Normal rate, regular rhythm, normal heart sounds and intact distal pulses.  Exam reveals no gallop and no friction rub.   No murmur heard. Pulmonary/Chest: Effort normal and breath sounds  normal. No respiratory distress. He has no wheezes. He has no rales. He exhibits no tenderness.  Abdominal: Soft. Bowel sounds are normal. He exhibits no distension and no mass. There is no tenderness. There is no rebound and no guarding.  Musculoskeletal: Normal range of motion. He exhibits no edema and no tenderness.  Lymphadenopathy:    He has no cervical adenopathy.  Neurological: He is alert and oriented to person, place, and time. He has normal reflexes. No cranial nerve deficit. He exhibits normal muscle tone. Coordination normal.  Skin: Skin is warm and dry. No rash noted.  Psychiatric: He has a normal mood and affect. His behavior is normal. Judgment and thought content normal.    Lab Results  Component Value Date   WBC 9.5 01/02/2013   HGB 15.4 01/02/2013   HCT 45.7 01/02/2013   PLT 299.0 01/02/2013   GLUCOSE 129* 01/02/2013   CHOL 172 01/02/2013   TRIG 211.0* 01/02/2013   HDL 37.60* 01/02/2013   LDLDIRECT 107.9 01/02/2013   ALT 27 01/02/2013   AST 21 01/02/2013   NA 138 01/02/2013   K 4.8 01/02/2013   CL 100 01/02/2013   CREATININE 1.1 01/02/2013   BUN 20 01/02/2013   CO2 30 01/02/2013   TSH 0.39 01/02/2013   PSA 0.85 01/02/2013   HGBA1C 5.5 01/02/2013         Assessment & Plan:

## 2013-04-07 NOTE — Assessment & Plan Note (Signed)
Rapaflo 1 qd UA BMET A1c

## 2013-04-07 NOTE — Assessment & Plan Note (Signed)
A1c

## 2013-04-07 NOTE — Assessment & Plan Note (Signed)
Wt Readings from Last 3 Encounters:  04/07/13 217 lb (98.431 kg)  01/03/13 230 lb (104.327 kg)  09/13/12 235 lb (106.595 kg)    

## 2013-04-07 NOTE — Assessment & Plan Note (Signed)
Continue with current prescription therapy as reflected on the Med list.  

## 2013-04-07 NOTE — Progress Notes (Signed)
Pre visit review using our clinic review tool, if applicable. No additional management support is needed unless otherwise documented below in the visit note. 

## 2013-04-07 NOTE — Assessment & Plan Note (Signed)
CPAP.  

## 2013-04-07 NOTE — Assessment & Plan Note (Signed)
Better  

## 2013-04-25 ENCOUNTER — Other Ambulatory Visit: Payer: Self-pay | Admitting: *Deleted

## 2013-04-25 MED ORDER — SILODOSIN 8 MG PO CAPS: 8.0000 mg | ORAL_CAPSULE | Freq: Every day | ORAL | Status: DC

## 2013-05-07 ENCOUNTER — Telehealth: Payer: Self-pay | Admitting: *Deleted

## 2013-05-07 NOTE — Telephone Encounter (Signed)
Pharmacist called states Rapaflo Rx needs PA.  Faxing over request

## 2013-05-08 NOTE — Telephone Encounter (Signed)
Is generic Detrol covered? Thx

## 2013-05-08 NOTE — Telephone Encounter (Signed)
Pt must try and fail Alfuzosin ER, Doxazocin, Terazosin or tamsulosin before Rapaflo will be covered. Please advise.

## 2013-05-09 NOTE — Telephone Encounter (Signed)
The ones covered may not work for [employer] problem. We can try if he is willing to Thx

## 2013-05-09 NOTE — Telephone Encounter (Signed)
No, only the drugs listed below are covered.

## 2013-05-12 MED ORDER — TAMSULOSIN HCL 0.4 MG PO CAPS: 0.4000 mg | ORAL_CAPSULE | Freq: Every day | ORAL | Status: DC

## 2013-05-12 NOTE — Telephone Encounter (Signed)
Pt informed-he is willing to try one of the preferred drugs. Please advise.

## 2013-05-12 NOTE — Telephone Encounter (Signed)
OK Tamsulosyn Thx

## 2013-05-21 ENCOUNTER — Telehealth: Payer: Self-pay | Admitting: *Deleted

## 2013-05-21 NOTE — Telephone Encounter (Signed)
Per ins a PA is not required for Rapaflo 8 mg cap. Also, we changed the Rx to tamsulosin.

## 2013-06-03 ENCOUNTER — Ambulatory Visit: Payer: 59 | Admitting: Family Medicine

## 2013-06-04 ENCOUNTER — Encounter: Payer: Self-pay | Admitting: Internal Medicine

## 2013-06-04 ENCOUNTER — Ambulatory Visit (INDEPENDENT_AMBULATORY_CARE_PROVIDER_SITE_OTHER): Payer: 59 | Admitting: Internal Medicine

## 2013-06-04 VITALS — BP 140/90 | HR 80 | Temp 98.1°F | Resp 16 | Wt 223.0 lb

## 2013-06-04 DIAGNOSIS — F4321 Adjustment disorder with depressed mood: Secondary | ICD-10-CM

## 2013-06-04 DIAGNOSIS — G4700 Insomnia, unspecified: Secondary | ICD-10-CM

## 2013-06-04 MED ORDER — CLONAZEPAM 0.5 MG PO TABS: 0.5000 mg | ORAL_TABLET | Freq: Two times a day (BID) | ORAL | Status: DC | PRN

## 2013-06-04 NOTE — Progress Notes (Signed)
   Subjective:     HPI  C/o anxiety and insomnia - his sister died suddenly last 62 - he found her dead on the sofa  C/o urgency x 3 wks - new The patient presents for a follow-up of  chronic hypogonadism, allergies and asthma  F/u tiredness -better  F/u fogginess - better    Review of Systems  Constitutional: Negative for appetite change, fatigue and unexpected weight change.  HENT: Negative for congestion, nosebleeds, sneezing, sore throat and trouble swallowing.   Eyes: Negative for itching and visual disturbance.  Respiratory: Negative for cough.   Cardiovascular: Negative for chest pain, palpitations and leg swelling.  Gastrointestinal: Negative for nausea, diarrhea, blood in stool and abdominal distention.  Genitourinary: Negative for frequency and hematuria.  Musculoskeletal: Negative for back pain, gait problem, joint swelling and neck pain.  Skin: Negative for rash.  Neurological: Negative for dizziness, tremors, speech difficulty and weakness.  Psychiatric/Behavioral: Negative for sleep disturbance, dysphoric mood and agitation. The patient is not nervous/anxious.    Wt Readings from Last 3 Encounters:  06/04/13 223 lb (101.152 kg)  04/07/13 217 lb (98.431 kg)  01/03/13 230 lb (104.327 kg)   BP Readings from Last 3 Encounters:  06/04/13 140/90  04/07/13 140/90  01/03/13 142/68       Objective:   Physical Exam  Constitutional: He is oriented to person, place, and time. He appears well-developed and well-nourished.  HENT:  Mouth/Throat: Oropharynx is clear and moist.  Eyes: Conjunctivae are normal. Pupils are equal, round, and reactive to light.  Neck: Normal range of motion. No JVD present. No thyromegaly present.  Cardiovascular: Normal rate, regular rhythm, normal heart sounds and intact distal pulses.  Exam reveals no gallop and no friction rub.   No murmur heard. Pulmonary/Chest: Effort normal and breath sounds normal. No respiratory distress. He has  no wheezes. He has no rales. He exhibits no tenderness.  Abdominal: Soft. Bowel sounds are normal. He exhibits no distension and no mass. There is no tenderness. There is no rebound and no guarding.  Musculoskeletal: Normal range of motion. He exhibits no edema and no tenderness.  Lymphadenopathy:    He has no cervical adenopathy.  Neurological: He is alert and oriented to person, place, and time. He has normal reflexes. No cranial nerve deficit. He exhibits normal muscle tone. Coordination normal.  Skin: Skin is warm and dry. No rash noted.  Psychiatric: He has a normal mood and affect. His behavior is normal. Judgment and thought content normal.    Lab Results  Component Value Date   WBC 9.5 01/02/2013   HGB 15.4 01/02/2013   HCT 45.7 01/02/2013   PLT 299.0 01/02/2013   GLUCOSE 129* 01/02/2013   CHOL 172 01/02/2013   TRIG 211.0* 01/02/2013   HDL 37.60* 01/02/2013   LDLDIRECT 107.9 01/02/2013   ALT 27 01/02/2013   AST 21 01/02/2013   NA 138 01/02/2013   K 4.8 01/02/2013   CL 100 01/02/2013   CREATININE 1.1 01/02/2013   BUN 20 01/02/2013   CO2 30 01/02/2013   TSH 0.39 01/02/2013   PSA 0.85 01/02/2013   HGBA1C 5.5 01/02/2013         Assessment & Plan:

## 2013-06-04 NOTE — Assessment & Plan Note (Signed)
Discussed. He will start counseling.  Clonazepam prn  Potential benefits of a short term benzodiazepines  use as well as potential risks  and complications were explained to the patient and were aknowledged.

## 2013-06-04 NOTE — Progress Notes (Signed)
Pre visit review using our clinic review tool, if applicable. No additional management support is needed unless otherwise documented below in the visit note. 

## 2013-06-05 DIAGNOSIS — G4700 Insomnia, unspecified: Secondary | ICD-10-CM | POA: Insufficient documentation

## 2013-06-05 NOTE — Assessment & Plan Note (Signed)
1/15 due to grief/anxiety See Rx

## 2013-06-18 ENCOUNTER — Ambulatory Visit (INDEPENDENT_AMBULATORY_CARE_PROVIDER_SITE_OTHER): Payer: 59 | Admitting: Psychology

## 2013-06-18 DIAGNOSIS — F4321 Adjustment disorder with depressed mood: Secondary | ICD-10-CM

## 2013-06-20 ENCOUNTER — Telehealth: Payer: Self-pay | Admitting: Internal Medicine

## 2013-06-20 NOTE — Telephone Encounter (Signed)
Relevant patient education assigned to patient using Emmi. ° °

## 2013-06-25 ENCOUNTER — Ambulatory Visit: Payer: 59 | Admitting: Psychology

## 2013-07-03 ENCOUNTER — Ambulatory Visit (INDEPENDENT_AMBULATORY_CARE_PROVIDER_SITE_OTHER): Payer: 59 | Admitting: Psychology

## 2013-07-03 DIAGNOSIS — F4321 Adjustment disorder with depressed mood: Secondary | ICD-10-CM

## 2013-07-09 ENCOUNTER — Other Ambulatory Visit: Payer: Self-pay | Admitting: Internal Medicine

## 2013-07-11 ENCOUNTER — Ambulatory Visit (INDEPENDENT_AMBULATORY_CARE_PROVIDER_SITE_OTHER): Payer: 59 | Admitting: Psychology

## 2013-07-11 ENCOUNTER — Ambulatory Visit: Payer: 59 | Admitting: Psychology

## 2013-07-11 DIAGNOSIS — F4321 Adjustment disorder with depressed mood: Secondary | ICD-10-CM

## 2013-07-14 ENCOUNTER — Ambulatory Visit (INDEPENDENT_AMBULATORY_CARE_PROVIDER_SITE_OTHER): Payer: 59 | Admitting: Internal Medicine

## 2013-07-14 ENCOUNTER — Encounter: Payer: Self-pay | Admitting: Internal Medicine

## 2013-07-14 VITALS — BP 162/90 | HR 80 | Temp 99.0°F | Resp 16 | Wt 219.0 lb

## 2013-07-14 DIAGNOSIS — F4321 Adjustment disorder with depressed mood: Secondary | ICD-10-CM

## 2013-07-14 DIAGNOSIS — F988 Other specified behavioral and emotional disorders with onset usually occurring in childhood and adolescence: Secondary | ICD-10-CM

## 2013-07-14 DIAGNOSIS — E291 Testicular hypofunction: Secondary | ICD-10-CM

## 2013-07-14 MED ORDER — TESTOSTERONE CYPIONATE 200 MG/ML IM SOLN: INTRAMUSCULAR | Status: DC

## 2013-07-14 NOTE — Assessment & Plan Note (Signed)
Continue with current prescription therapy as reflected on the Med list.  

## 2013-07-14 NOTE — Progress Notes (Signed)
Pre visit review using our clinic review tool, if applicable. No additional management support is needed unless otherwise documented below in the visit note. 

## 2013-07-14 NOTE — Progress Notes (Signed)
   Subjective:     HPI  F/u anxiety and insomnia - his sister died suddenly last 66 - he found her dead on the sofa He is gieving  F/u urgency x 3 wks - new The patient presents for a follow-up of  chronic hypogonadism, allergies and asthma  F/u tiredness -better  F/u fogginess - better    Review of Systems  Constitutional: Negative for appetite change, fatigue and unexpected weight change.  HENT: Negative for congestion, nosebleeds, sneezing, sore throat and trouble swallowing.   Eyes: Negative for itching and visual disturbance.  Respiratory: Negative for cough.   Cardiovascular: Negative for chest pain, palpitations and leg swelling.  Gastrointestinal: Negative for nausea, diarrhea, blood in stool and abdominal distention.  Genitourinary: Negative for frequency and hematuria.  Musculoskeletal: Negative for back pain, gait problem, joint swelling and neck pain.  Skin: Negative for rash.  Neurological: Negative for dizziness, tremors, speech difficulty and weakness.  Psychiatric/Behavioral: Negative for sleep disturbance, dysphoric mood and agitation. The patient is not nervous/anxious.    Wt Readings from Last 3 Encounters:  07/14/13 219 lb (99.338 kg)  06/04/13 223 lb (101.152 kg)  04/07/13 217 lb (98.431 kg)   BP Readings from Last 3 Encounters:  07/14/13 162/90  06/04/13 140/90  04/07/13 140/90       Objective:   Physical Exam  Constitutional: He is oriented to person, place, and time. He appears well-developed and well-nourished.  HENT:  Mouth/Throat: Oropharynx is clear and moist.  Eyes: Conjunctivae are normal. Pupils are equal, round, and reactive to light.  Neck: Normal range of motion. No JVD present. No thyromegaly present.  Cardiovascular: Normal rate, regular rhythm, normal heart sounds and intact distal pulses.  Exam reveals no gallop and no friction rub.   No murmur heard. Pulmonary/Chest: Effort normal and breath sounds normal. No respiratory  distress. He has no wheezes. He has no rales. He exhibits no tenderness.  Abdominal: Soft. Bowel sounds are normal. He exhibits no distension and no mass. There is no tenderness. There is no rebound and no guarding.  Musculoskeletal: Normal range of motion. He exhibits no edema and no tenderness.  Lymphadenopathy:    He has no cervical adenopathy.  Neurological: He is alert and oriented to person, place, and time. He has normal reflexes. No cranial nerve deficit. He exhibits normal muscle tone. Coordination normal.  Skin: Skin is warm and dry. No rash noted.  Psychiatric: He has a normal mood and affect. His behavior is normal. Judgment and thought content normal.    Lab Results  Component Value Date   WBC 9.5 01/02/2013   HGB 15.4 01/02/2013   HCT 45.7 01/02/2013   PLT 299.0 01/02/2013   GLUCOSE 129* 01/02/2013   CHOL 172 01/02/2013   TRIG 211.0* 01/02/2013   HDL 37.60* 01/02/2013   LDLDIRECT 107.9 01/02/2013   ALT 27 01/02/2013   AST 21 01/02/2013   NA 138 01/02/2013   K 4.8 01/02/2013   CL 100 01/02/2013   CREATININE 1.1 01/02/2013   BUN 20 01/02/2013   CO2 30 01/02/2013   TSH 0.39 01/02/2013   PSA 0.85 01/02/2013   HGBA1C 5.5 01/02/2013         Assessment & Plan:

## 2013-07-14 NOTE — Assessment & Plan Note (Signed)
1/15 sister died Dr Cheryln Manly Continue with prn current prescription therapy as reflected on the Med list.

## 2013-07-17 ENCOUNTER — Ambulatory Visit (INDEPENDENT_AMBULATORY_CARE_PROVIDER_SITE_OTHER): Payer: 59 | Admitting: Psychology

## 2013-07-17 DIAGNOSIS — F4321 Adjustment disorder with depressed mood: Secondary | ICD-10-CM

## 2013-07-24 ENCOUNTER — Ambulatory Visit (INDEPENDENT_AMBULATORY_CARE_PROVIDER_SITE_OTHER): Payer: 59 | Admitting: Psychology

## 2013-07-24 DIAGNOSIS — F4321 Adjustment disorder with depressed mood: Secondary | ICD-10-CM

## 2013-07-31 ENCOUNTER — Ambulatory Visit (INDEPENDENT_AMBULATORY_CARE_PROVIDER_SITE_OTHER): Payer: 59 | Admitting: Psychology

## 2013-07-31 DIAGNOSIS — F4321 Adjustment disorder with depressed mood: Secondary | ICD-10-CM

## 2013-08-07 ENCOUNTER — Ambulatory Visit (INDEPENDENT_AMBULATORY_CARE_PROVIDER_SITE_OTHER): Payer: 59 | Admitting: Psychology

## 2013-08-07 ENCOUNTER — Ambulatory Visit (INDEPENDENT_AMBULATORY_CARE_PROVIDER_SITE_OTHER): Payer: 59 | Admitting: Internal Medicine

## 2013-08-07 ENCOUNTER — Encounter: Payer: Self-pay | Admitting: Internal Medicine

## 2013-08-07 VITALS — BP 168/10 | HR 84 | Temp 98.2°F | Resp 16 | Wt 220.0 lb

## 2013-08-07 DIAGNOSIS — E291 Testicular hypofunction: Secondary | ICD-10-CM

## 2013-08-07 DIAGNOSIS — F4321 Adjustment disorder with depressed mood: Secondary | ICD-10-CM

## 2013-08-07 DIAGNOSIS — R634 Abnormal weight loss: Secondary | ICD-10-CM

## 2013-08-07 DIAGNOSIS — R3915 Urgency of urination: Secondary | ICD-10-CM

## 2013-08-07 DIAGNOSIS — F988 Other specified behavioral and emotional disorders with onset usually occurring in childhood and adolescence: Secondary | ICD-10-CM

## 2013-08-07 LAB — GLUCOSE, POCT (MANUAL RESULT ENTRY): POC Glucose: 82 mg/dl (ref 70–99)

## 2013-08-07 MED ORDER — MIRABEGRON ER 50 MG PO TB24: 50.0000 mg | ORAL_TABLET | Freq: Every day | ORAL | Status: DC

## 2013-08-07 NOTE — Assessment & Plan Note (Signed)
CBG Urol ref

## 2013-08-07 NOTE — Progress Notes (Deleted)
Pre visit review using our clinic review tool, if applicable. No additional management support is needed unless otherwise documented below in the visit note. 

## 2013-08-07 NOTE — Assessment & Plan Note (Signed)
Continue with current prescription therapy as reflected on the Med list.  

## 2013-08-07 NOTE — Progress Notes (Signed)
   Subjective:     HPI  F/u anxiety and insomnia - his sister died suddenly last 87 - he found her dead on the sofa He is gieving  F/u urgency x 3 wks - new in 2015; worse -- bad urgency, peeing a lot The patient presents for a follow-up of  chronic hypogonadism, allergies and asthma  F/u tiredness - worse this week  F/u fogginess - better    Review of Systems  Constitutional: Negative for appetite change, fatigue and unexpected weight change.  HENT: Negative for congestion, nosebleeds, sneezing, sore throat and trouble swallowing.   Eyes: Negative for itching and visual disturbance.  Respiratory: Negative for cough.   Cardiovascular: Negative for chest pain, palpitations and leg swelling.  Gastrointestinal: Negative for nausea, diarrhea, blood in stool and abdominal distention.  Genitourinary: Negative for frequency and hematuria.  Musculoskeletal: Negative for back pain, gait problem, joint swelling and neck pain.  Skin: Negative for rash.  Neurological: Negative for dizziness, tremors, speech difficulty and weakness.  Psychiatric/Behavioral: Negative for sleep disturbance, dysphoric mood and agitation. The patient is not nervous/anxious.    Wt Readings from Last 3 Encounters:  08/07/13 220 lb (99.791 kg)  07/14/13 219 lb (99.338 kg)  06/04/13 223 lb (101.152 kg)   BP Readings from Last 3 Encounters:  08/07/13 168/10  07/14/13 162/90  06/04/13 140/90       Objective:   Physical Exam  Constitutional: He is oriented to person, place, and time. He appears well-developed and well-nourished.  HENT:  Mouth/Throat: Oropharynx is clear and moist.  Eyes: Conjunctivae are normal. Pupils are equal, round, and reactive to light.  Neck: Normal range of motion. No JVD present. No thyromegaly present.  Cardiovascular: Normal rate, regular rhythm, normal heart sounds and intact distal pulses.  Exam reveals no gallop and no friction rub.   No murmur heard. Pulmonary/Chest: Effort  normal and breath sounds normal. No respiratory distress. He has no wheezes. He has no rales. He exhibits no tenderness.  Abdominal: Soft. Bowel sounds are normal. He exhibits no distension and no mass. There is no tenderness. There is no rebound and no guarding.  Musculoskeletal: Normal range of motion. He exhibits no edema and no tenderness.  Lymphadenopathy:    He has no cervical adenopathy.  Neurological: He is alert and oriented to person, place, and time. He has normal reflexes. No cranial nerve deficit. He exhibits normal muscle tone. Coordination normal.  Skin: Skin is warm and dry. No rash noted.  Psychiatric: He has a normal mood and affect. His behavior is normal. Judgment and thought content normal.    Lab Results  Component Value Date   WBC 9.5 01/02/2013   HGB 15.4 01/02/2013   HCT 45.7 01/02/2013   PLT 299.0 01/02/2013   GLUCOSE 129* 01/02/2013   CHOL 172 01/02/2013   TRIG 211.0* 01/02/2013   HDL 37.60* 01/02/2013   LDLDIRECT 107.9 01/02/2013   ALT 27 01/02/2013   AST 21 01/02/2013   NA 138 01/02/2013   K 4.8 01/02/2013   CL 100 01/02/2013   CREATININE 1.1 01/02/2013   BUN 20 01/02/2013   CO2 30 01/02/2013   TSH 0.39 01/02/2013   PSA 0.85 01/02/2013   HGBA1C 5.5 01/02/2013         Assessment & Plan:

## 2013-08-07 NOTE — Assessment & Plan Note (Signed)
06/23/22 sister died F/u w/Dr Cheryln Manly

## 2013-08-07 NOTE — Assessment & Plan Note (Signed)
Wt Readings from Last 3 Encounters:  08/07/13 220 lb (99.791 kg)  07/14/13 219 lb (99.338 kg)  06/04/13 223 lb (101.152 kg)

## 2013-08-14 ENCOUNTER — Ambulatory Visit: Payer: 59 | Admitting: Psychology

## 2013-08-21 ENCOUNTER — Ambulatory Visit (INDEPENDENT_AMBULATORY_CARE_PROVIDER_SITE_OTHER): Payer: 59 | Admitting: Psychology

## 2013-08-21 DIAGNOSIS — F4321 Adjustment disorder with depressed mood: Secondary | ICD-10-CM

## 2013-08-28 ENCOUNTER — Ambulatory Visit (INDEPENDENT_AMBULATORY_CARE_PROVIDER_SITE_OTHER): Payer: 59 | Admitting: Psychology

## 2013-08-28 DIAGNOSIS — F4321 Adjustment disorder with depressed mood: Secondary | ICD-10-CM

## 2013-09-04 ENCOUNTER — Ambulatory Visit (INDEPENDENT_AMBULATORY_CARE_PROVIDER_SITE_OTHER): Payer: 59 | Admitting: Psychology

## 2013-09-04 DIAGNOSIS — F4321 Adjustment disorder with depressed mood: Secondary | ICD-10-CM

## 2013-09-07 ENCOUNTER — Encounter: Payer: Self-pay | Admitting: Internal Medicine

## 2013-09-11 ENCOUNTER — Ambulatory Visit (INDEPENDENT_AMBULATORY_CARE_PROVIDER_SITE_OTHER): Payer: 59 | Admitting: Psychology

## 2013-09-11 DIAGNOSIS — F4321 Adjustment disorder with depressed mood: Secondary | ICD-10-CM

## 2013-09-16 ENCOUNTER — Telehealth: Payer: Self-pay | Admitting: Internal Medicine

## 2013-09-16 NOTE — Telephone Encounter (Signed)
Pharmacy sent PA request. Insurance will not cover with prior authorization for the following rx: Mybetriq ER 50 mg tablet. DX: [phone]

## 2013-09-18 ENCOUNTER — Ambulatory Visit: Payer: 59 | Admitting: Psychology

## 2013-09-18 NOTE — Telephone Encounter (Signed)
Sending PA request thru covermymeds. Waiting on response.

## 2013-09-25 ENCOUNTER — Ambulatory Visit (INDEPENDENT_AMBULATORY_CARE_PROVIDER_SITE_OTHER): Payer: 59 | Admitting: Psychology

## 2013-09-25 DIAGNOSIS — F4321 Adjustment disorder with depressed mood: Secondary | ICD-10-CM

## 2013-10-02 ENCOUNTER — Ambulatory Visit: Payer: 59 | Admitting: Psychology

## 2013-10-09 ENCOUNTER — Ambulatory Visit (INDEPENDENT_AMBULATORY_CARE_PROVIDER_SITE_OTHER): Payer: 59 | Admitting: Psychology

## 2013-10-09 DIAGNOSIS — F4321 Adjustment disorder with depressed mood: Secondary | ICD-10-CM

## 2013-10-15 ENCOUNTER — Other Ambulatory Visit: Payer: Self-pay | Admitting: Dermatology

## 2013-10-15 ENCOUNTER — Ambulatory Visit (INDEPENDENT_AMBULATORY_CARE_PROVIDER_SITE_OTHER): Payer: 59 | Admitting: Internal Medicine

## 2013-10-15 ENCOUNTER — Encounter: Payer: Self-pay | Admitting: Internal Medicine

## 2013-10-15 VITALS — BP 140/78 | HR 66 | Temp 98.3°F | Wt 212.2 lb

## 2013-10-15 DIAGNOSIS — E291 Testicular hypofunction: Secondary | ICD-10-CM

## 2013-10-15 DIAGNOSIS — R3915 Urgency of urination: Secondary | ICD-10-CM

## 2013-10-15 DIAGNOSIS — F988 Other specified behavioral and emotional disorders with onset usually occurring in childhood and adolescence: Secondary | ICD-10-CM

## 2013-10-15 DIAGNOSIS — R634 Abnormal weight loss: Secondary | ICD-10-CM

## 2013-10-15 DIAGNOSIS — J45909 Unspecified asthma, uncomplicated: Secondary | ICD-10-CM

## 2013-10-15 DIAGNOSIS — F4321 Adjustment disorder with depressed mood: Secondary | ICD-10-CM

## 2013-10-15 MED ORDER — AMPHETAMINE-DEXTROAMPHETAMINE 10 MG PO TABS: 10.0000 mg | ORAL_TABLET | Freq: Two times a day (BID) | ORAL | Status: DC

## 2013-10-15 MED ORDER — "SYRINGE/NEEDLE (DISP) 25G X 1"" 3 ML MISC": Status: DC

## 2013-10-15 MED ORDER — ALBUTEROL SULFATE HFA 108 (90 BASE) MCG/ACT IN AERS: 2.0000 | INHALATION_SPRAY | Freq: Four times a day (QID) | RESPIRATORY_TRACT | Status: DC | PRN

## 2013-10-15 MED ORDER — TESTOSTERONE CYPIONATE 200 MG/ML IM SOLN: INTRAMUSCULAR | Status: DC

## 2013-10-15 NOTE — Assessment & Plan Note (Signed)
Better  

## 2013-10-15 NOTE — Assessment & Plan Note (Signed)
Wt Readings from Last 3 Encounters:  10/15/13 212 lb 4 oz (96.276 kg)  08/07/13 220 lb (99.791 kg)  07/14/13 219 lb (99.338 kg)

## 2013-10-15 NOTE — Assessment & Plan Note (Signed)
Doing well 

## 2013-10-15 NOTE — Assessment & Plan Note (Signed)
Continue with current prescription therapy as reflected on the Med list.  

## 2013-10-15 NOTE — Assessment & Plan Note (Signed)
Resolved spontaneously - off Rx

## 2013-10-15 NOTE — Progress Notes (Signed)
Pre visit review using our clinic review tool, if applicable. No additional management support is needed unless otherwise documented below in the visit note. 

## 2013-10-15 NOTE — Progress Notes (Signed)
   Subjective:     HPI  F/u anxiety and insomnia - his sister died suddenly a few months ago - he found her dead on the sofa. He is grieving. He moved his dad into his house.  F/u urgency x 3 wks - in 2015, peeing a lot - resolved spontaneously. He saw a Dealer today  The patient presents for a follow-up of  chronic hypogonadism, allergies and asthma  F/u tiredness - worse this week  F/u fogginess - better    Review of Systems  Constitutional: Negative for appetite change, fatigue and unexpected weight change.  HENT: Negative for congestion, nosebleeds, sneezing, sore throat and trouble swallowing.   Eyes: Negative for itching and visual disturbance.  Respiratory: Negative for cough.   Cardiovascular: Negative for chest pain, palpitations and leg swelling.  Gastrointestinal: Negative for nausea, diarrhea, blood in stool and abdominal distention.  Genitourinary: Negative for frequency and hematuria.  Musculoskeletal: Negative for back pain, gait problem, joint swelling and neck pain.  Skin: Negative for rash.  Neurological: Negative for dizziness, tremors, speech difficulty and weakness.  Psychiatric/Behavioral: Negative for sleep disturbance, dysphoric mood and agitation. The patient is not nervous/anxious.    Wt Readings from Last 3 Encounters:  10/15/13 212 lb 4 oz (96.276 kg)  08/07/13 220 lb (99.791 kg)  07/14/13 219 lb (99.338 kg)   BP Readings from Last 3 Encounters:  10/15/13 140/78  08/07/13 168/10  07/14/13 162/90       Objective:   Physical Exam  Constitutional: He is oriented to person, place, and time. He appears well-developed and well-nourished.  HENT:  Mouth/Throat: Oropharynx is clear and moist.  Eyes: Conjunctivae are normal. Pupils are equal, round, and reactive to light.  Neck: Normal range of motion. No JVD present. No thyromegaly present.  Cardiovascular: Normal rate, regular rhythm, normal heart sounds and intact distal pulses.  Exam reveals  no gallop and no friction rub.   No murmur heard. Pulmonary/Chest: Effort normal and breath sounds normal. No respiratory distress. He has no wheezes. He has no rales. He exhibits no tenderness.  Abdominal: Soft. Bowel sounds are normal. He exhibits no distension and no mass. There is no tenderness. There is no rebound and no guarding.  Musculoskeletal: Normal range of motion. He exhibits no edema and no tenderness.  Lymphadenopathy:    He has no cervical adenopathy.  Neurological: He is alert and oriented to person, place, and time. He has normal reflexes. No cranial nerve deficit. He exhibits normal muscle tone. Coordination normal.  Skin: Skin is warm and dry. No rash noted.  Psychiatric: He has a normal mood and affect. His behavior is normal. Judgment and thought content normal.    Lab Results  Component Value Date   WBC 9.5 01/02/2013   HGB 15.4 01/02/2013   HCT 45.7 01/02/2013   PLT 299.0 01/02/2013   GLUCOSE 129* 01/02/2013   CHOL 172 01/02/2013   TRIG 211.0* 01/02/2013   HDL 37.60* 01/02/2013   LDLDIRECT 107.9 01/02/2013   ALT 27 01/02/2013   AST 21 01/02/2013   NA 138 01/02/2013   K 4.8 01/02/2013   CL 100 01/02/2013   CREATININE 1.1 01/02/2013   BUN 20 01/02/2013   CO2 30 01/02/2013   TSH 0.39 01/02/2013   PSA 0.85 01/02/2013   HGBA1C 5.5 01/02/2013         Assessment & Plan:

## 2013-10-16 ENCOUNTER — Ambulatory Visit (INDEPENDENT_AMBULATORY_CARE_PROVIDER_SITE_OTHER): Payer: 59 | Admitting: Psychology

## 2013-10-16 DIAGNOSIS — F4321 Adjustment disorder with depressed mood: Secondary | ICD-10-CM

## 2013-10-23 ENCOUNTER — Ambulatory Visit: Payer: 59 | Admitting: Psychology

## 2013-10-30 ENCOUNTER — Ambulatory Visit (INDEPENDENT_AMBULATORY_CARE_PROVIDER_SITE_OTHER): Payer: 59 | Admitting: Psychology

## 2013-10-30 DIAGNOSIS — F4321 Adjustment disorder with depressed mood: Secondary | ICD-10-CM

## 2013-11-04 ENCOUNTER — Other Ambulatory Visit: Payer: Self-pay | Admitting: Dermatology

## 2013-11-06 ENCOUNTER — Ambulatory Visit: Payer: 59 | Admitting: Psychology

## 2013-11-13 ENCOUNTER — Ambulatory Visit (INDEPENDENT_AMBULATORY_CARE_PROVIDER_SITE_OTHER): Payer: 59 | Admitting: Psychology

## 2013-11-13 DIAGNOSIS — F4321 Adjustment disorder with depressed mood: Secondary | ICD-10-CM

## 2013-11-20 ENCOUNTER — Ambulatory Visit: Payer: 59 | Admitting: Psychology

## 2013-11-25 ENCOUNTER — Ambulatory Visit (INDEPENDENT_AMBULATORY_CARE_PROVIDER_SITE_OTHER): Payer: 59 | Admitting: Psychology

## 2013-11-25 DIAGNOSIS — F4321 Adjustment disorder with depressed mood: Secondary | ICD-10-CM

## 2013-11-27 ENCOUNTER — Ambulatory Visit: Payer: 59 | Admitting: Psychology

## 2013-12-11 ENCOUNTER — Ambulatory Visit (INDEPENDENT_AMBULATORY_CARE_PROVIDER_SITE_OTHER): Payer: 59 | Admitting: Psychology

## 2013-12-11 DIAGNOSIS — F4321 Adjustment disorder with depressed mood: Secondary | ICD-10-CM

## 2013-12-25 ENCOUNTER — Ambulatory Visit (INDEPENDENT_AMBULATORY_CARE_PROVIDER_SITE_OTHER): Payer: 59 | Admitting: Psychology

## 2013-12-25 DIAGNOSIS — F4321 Adjustment disorder with depressed mood: Secondary | ICD-10-CM

## 2014-01-01 ENCOUNTER — Ambulatory Visit (INDEPENDENT_AMBULATORY_CARE_PROVIDER_SITE_OTHER): Payer: 59 | Admitting: Psychology

## 2014-01-01 DIAGNOSIS — F4321 Adjustment disorder with depressed mood: Secondary | ICD-10-CM

## 2014-01-08 ENCOUNTER — Ambulatory Visit: Payer: 59 | Admitting: Psychology

## 2014-01-15 ENCOUNTER — Ambulatory Visit (INDEPENDENT_AMBULATORY_CARE_PROVIDER_SITE_OTHER): Payer: 59 | Admitting: Psychology

## 2014-01-15 DIAGNOSIS — F4321 Adjustment disorder with depressed mood: Secondary | ICD-10-CM

## 2014-01-16 ENCOUNTER — Encounter: Payer: Self-pay | Admitting: Internal Medicine

## 2014-01-16 ENCOUNTER — Ambulatory Visit (INDEPENDENT_AMBULATORY_CARE_PROVIDER_SITE_OTHER): Payer: 59 | Admitting: Internal Medicine

## 2014-01-16 VITALS — BP 172/84 | HR 70 | Temp 98.1°F | Resp 22 | Ht 78.0 in | Wt 220.1 lb

## 2014-01-16 DIAGNOSIS — F988 Other specified behavioral and emotional disorders with onset usually occurring in childhood and adolescence: Secondary | ICD-10-CM

## 2014-01-16 DIAGNOSIS — F4321 Adjustment disorder with depressed mood: Secondary | ICD-10-CM

## 2014-01-16 DIAGNOSIS — Z0000 Encounter for general adult medical examination without abnormal findings: Secondary | ICD-10-CM

## 2014-01-16 DIAGNOSIS — E291 Testicular hypofunction: Secondary | ICD-10-CM

## 2014-01-16 DIAGNOSIS — R634 Abnormal weight loss: Secondary | ICD-10-CM

## 2014-01-16 DIAGNOSIS — Z23 Encounter for immunization: Secondary | ICD-10-CM

## 2014-01-16 MED ORDER — AMPHETAMINE-DEXTROAMPHETAMINE 10 MG PO TABS: 10.0000 mg | ORAL_TABLET | Freq: Two times a day (BID) | ORAL | Status: DC

## 2014-01-16 NOTE — Progress Notes (Signed)
Pre visit review using our clinic review tool, if applicable. No additional management support is needed unless otherwise documented below in the visit note. 

## 2014-01-16 NOTE — Assessment & Plan Note (Signed)
Continue with current prescription therapy as reflected on the Med list.  

## 2014-01-16 NOTE — Progress Notes (Signed)
   Subjective:     HPI  He moved his dad to live with him in [place]  F/u anxiety and insomnia - his sister died suddenly a few months ago - he found her dead on the sofa. He is grieving. He moved his dad into his house.  F/u urgency x 3 wks - in 2015, peeing a lot - resolved spontaneously. He saw a Dealer today  The patient presents for a follow-up of  chronic hypogonadism, allergies and asthma  F/u tiredness - worse this week  F/u fogginess - better    Review of Systems  Constitutional: Negative for appetite change, fatigue and unexpected weight change.  HENT: Negative for congestion, nosebleeds, sneezing, sore throat and trouble swallowing.   Eyes: Negative for itching and visual disturbance.  Respiratory: Negative for cough.   Cardiovascular: Negative for chest pain, palpitations and leg swelling.  Gastrointestinal: Negative for nausea, diarrhea, blood in stool and abdominal distention.  Genitourinary: Negative for frequency and hematuria.  Musculoskeletal: Negative for back pain, gait problem, joint swelling and neck pain.  Skin: Negative for rash.  Neurological: Negative for dizziness, tremors, speech difficulty and weakness.  Psychiatric/Behavioral: Negative for sleep disturbance, dysphoric mood and agitation. The patient is not nervous/anxious.    Wt Readings from Last 3 Encounters:  01/16/14 220 lb 1.9 oz (99.846 kg)  10/15/13 212 lb 4 oz (96.276 kg)  08/07/13 220 lb (99.791 kg)   BP Readings from Last 3 Encounters:  01/16/14 172/84  10/15/13 140/78  08/07/13 168/10       Objective:   Physical Exam  Constitutional: He is oriented to person, place, and time. He appears well-developed and well-nourished.  HENT:  Mouth/Throat: Oropharynx is clear and moist.  Eyes: Conjunctivae are normal. Pupils are equal, round, and reactive to light.  Neck: Normal range of motion. No JVD present. No thyromegaly present.  Cardiovascular: Normal rate, regular rhythm, normal  heart sounds and intact distal pulses.  Exam reveals no gallop and no friction rub.   No murmur heard. Pulmonary/Chest: Effort normal and breath sounds normal. No respiratory distress. He has no wheezes. He has no rales. He exhibits no tenderness.  Abdominal: Soft. Bowel sounds are normal. He exhibits no distension and no mass. There is no tenderness. There is no rebound and no guarding.  Musculoskeletal: Normal range of motion. He exhibits no edema and no tenderness.  Lymphadenopathy:    He has no cervical adenopathy.  Neurological: He is alert and oriented to person, place, and time. He has normal reflexes. No cranial nerve deficit. He exhibits normal muscle tone. Coordination normal.  Skin: Skin is warm and dry. No rash noted.  Psychiatric: He has a normal mood and affect. His behavior is normal. Judgment and thought content normal.    Lab Results  Component Value Date   WBC 9.5 01/02/2013   HGB 15.4 01/02/2013   HCT 45.7 01/02/2013   PLT 299.0 01/02/2013   GLUCOSE 129* 01/02/2013   CHOL 172 01/02/2013   TRIG 211.0* 01/02/2013   HDL 37.60* 01/02/2013   LDLDIRECT 107.9 01/02/2013   ALT 27 01/02/2013   AST 21 01/02/2013   NA 138 01/02/2013   K 4.8 01/02/2013   CL 100 01/02/2013   CREATININE 1.1 01/02/2013   BUN 20 01/02/2013   CO2 30 01/02/2013   TSH 0.39 01/02/2013   PSA 0.85 01/02/2013   HGBA1C 5.5 01/02/2013         Assessment & Plan:

## 2014-01-16 NOTE — Assessment & Plan Note (Signed)
A little better 

## 2014-01-16 NOTE — Assessment & Plan Note (Signed)
Continue with current prescription therapy as reflected on the Med list.   Potential benefits of a long term stimulants use as well as potential risks  and complications were explained to the patient and were aknowledged.

## 2014-01-16 NOTE — Assessment & Plan Note (Signed)
Wt Readings from Last 3 Encounters:  01/16/14 220 lb 1.9 oz (99.846 kg)  10/15/13 212 lb 4 oz (96.276 kg)  08/07/13 220 lb (99.791 kg)

## 2014-01-22 ENCOUNTER — Ambulatory Visit (INDEPENDENT_AMBULATORY_CARE_PROVIDER_SITE_OTHER): Payer: 59 | Admitting: Psychology

## 2014-01-22 DIAGNOSIS — F4321 Adjustment disorder with depressed mood: Secondary | ICD-10-CM

## 2014-01-26 ENCOUNTER — Encounter: Payer: Self-pay | Admitting: Internal Medicine

## 2014-01-29 ENCOUNTER — Ambulatory Visit (INDEPENDENT_AMBULATORY_CARE_PROVIDER_SITE_OTHER): Payer: 59 | Admitting: Psychology

## 2014-01-29 DIAGNOSIS — F4321 Adjustment disorder with depressed mood: Secondary | ICD-10-CM

## 2014-02-05 ENCOUNTER — Ambulatory Visit: Payer: 59 | Admitting: Psychology

## 2014-02-06 ENCOUNTER — Encounter: Payer: Self-pay | Admitting: Gastroenterology

## 2014-02-10 ENCOUNTER — Ambulatory Visit (INDEPENDENT_AMBULATORY_CARE_PROVIDER_SITE_OTHER): Payer: 59 | Admitting: Psychology

## 2014-02-10 DIAGNOSIS — F4321 Adjustment disorder with depressed mood: Secondary | ICD-10-CM

## 2014-02-12 ENCOUNTER — Ambulatory Visit: Payer: 59 | Admitting: Psychology

## 2014-02-19 ENCOUNTER — Ambulatory Visit (INDEPENDENT_AMBULATORY_CARE_PROVIDER_SITE_OTHER): Payer: 59 | Admitting: Psychology

## 2014-02-19 DIAGNOSIS — F4321 Adjustment disorder with depressed mood: Secondary | ICD-10-CM

## 2014-02-24 ENCOUNTER — Telehealth: Payer: Self-pay | Admitting: *Deleted

## 2014-02-24 NOTE — Telephone Encounter (Signed)
Left msg on triage stating Dr. Cheryln Manly is recommending that he start taking zoloft. He ask for pt to call md to get rx for zoloft...[name]

## 2014-02-25 MED ORDER — SERTRALINE HCL 50 MG PO TABS: 50.0000 mg | ORAL_TABLET | Freq: Every day | ORAL | Status: DC

## 2014-02-25 NOTE — Telephone Encounter (Signed)
Pls advise on msg below.../lmb 

## 2014-02-25 NOTE — Telephone Encounter (Signed)
Called pt no answer LMOM md has sent zoloft to [facility]...Victor Montes

## 2014-02-25 NOTE — Telephone Encounter (Signed)
Start 50 mg 1/2 tab a day x 1 week, then 1 tab a day (emailed) Thx

## 2014-02-26 ENCOUNTER — Ambulatory Visit (INDEPENDENT_AMBULATORY_CARE_PROVIDER_SITE_OTHER): Payer: 59 | Admitting: Psychology

## 2014-02-26 DIAGNOSIS — F4321 Adjustment disorder with depressed mood: Secondary | ICD-10-CM

## 2014-03-05 ENCOUNTER — Ambulatory Visit (INDEPENDENT_AMBULATORY_CARE_PROVIDER_SITE_OTHER): Payer: 59 | Admitting: Psychology

## 2014-03-05 DIAGNOSIS — F4321 Adjustment disorder with depressed mood: Secondary | ICD-10-CM

## 2014-03-06 ENCOUNTER — Other Ambulatory Visit: Payer: Self-pay

## 2014-03-12 ENCOUNTER — Ambulatory Visit (INDEPENDENT_AMBULATORY_CARE_PROVIDER_SITE_OTHER): Payer: 59 | Admitting: Psychology

## 2014-03-12 DIAGNOSIS — F4321 Adjustment disorder with depressed mood: Secondary | ICD-10-CM

## 2014-03-19 ENCOUNTER — Ambulatory Visit (INDEPENDENT_AMBULATORY_CARE_PROVIDER_SITE_OTHER): Payer: 59 | Admitting: Psychology

## 2014-03-19 DIAGNOSIS — F4321 Adjustment disorder with depressed mood: Secondary | ICD-10-CM

## 2014-03-26 ENCOUNTER — Ambulatory Visit (INDEPENDENT_AMBULATORY_CARE_PROVIDER_SITE_OTHER): Payer: 59 | Admitting: Psychology

## 2014-03-26 DIAGNOSIS — F4321 Adjustment disorder with depressed mood: Secondary | ICD-10-CM

## 2014-04-02 ENCOUNTER — Ambulatory Visit (INDEPENDENT_AMBULATORY_CARE_PROVIDER_SITE_OTHER): Payer: 59 | Admitting: Psychology

## 2014-04-02 DIAGNOSIS — F4321 Adjustment disorder with depressed mood: Secondary | ICD-10-CM

## 2014-04-09 ENCOUNTER — Ambulatory Visit: Payer: 59 | Admitting: Psychology

## 2014-04-09 ENCOUNTER — Ambulatory Visit (INDEPENDENT_AMBULATORY_CARE_PROVIDER_SITE_OTHER): Payer: 59 | Admitting: Psychology

## 2014-04-09 DIAGNOSIS — F4321 Adjustment disorder with depressed mood: Secondary | ICD-10-CM

## 2014-04-23 ENCOUNTER — Ambulatory Visit: Payer: 59 | Admitting: Psychology

## 2014-04-24 ENCOUNTER — Encounter: Payer: Self-pay | Admitting: Internal Medicine

## 2014-04-24 ENCOUNTER — Ambulatory Visit (INDEPENDENT_AMBULATORY_CARE_PROVIDER_SITE_OTHER): Payer: 59 | Admitting: Internal Medicine

## 2014-04-24 ENCOUNTER — Other Ambulatory Visit (INDEPENDENT_AMBULATORY_CARE_PROVIDER_SITE_OTHER): Payer: 59

## 2014-04-24 VITALS — BP 150/90 | HR 62 | Temp 98.4°F | Wt 223.0 lb

## 2014-04-24 DIAGNOSIS — Z0000 Encounter for general adult medical examination without abnormal findings: Secondary | ICD-10-CM

## 2014-04-24 DIAGNOSIS — F988 Other specified behavioral and emotional disorders with onset usually occurring in childhood and adolescence: Secondary | ICD-10-CM

## 2014-04-24 DIAGNOSIS — E291 Testicular hypofunction: Secondary | ICD-10-CM

## 2014-04-24 DIAGNOSIS — F4321 Adjustment disorder with depressed mood: Secondary | ICD-10-CM

## 2014-04-24 DIAGNOSIS — F909 Attention-deficit hyperactivity disorder, unspecified type: Secondary | ICD-10-CM

## 2014-04-24 MED ORDER — AMPHETAMINE-DEXTROAMPHETAMINE 10 MG PO TABS: 10.0000 mg | ORAL_TABLET | Freq: Two times a day (BID) | ORAL | Status: DC

## 2014-04-24 MED ORDER — TESTOSTERONE CYPIONATE 200 MG/ML IM SOLN: INTRAMUSCULAR | Status: DC

## 2014-04-24 MED ORDER — ALBUTEROL SULFATE HFA 108 (90 BASE) MCG/ACT IN AERS: 2.0000 | INHALATION_SPRAY | Freq: Four times a day (QID) | RESPIRATORY_TRACT | Status: DC | PRN

## 2014-04-24 NOTE — Progress Notes (Signed)
   Subjective:     HPI  Dewon's dad died in 01-25-2014  F/u anxiety and insomnia - his sister died suddenly a few months ago - winter 2015 - he found her dead on the sofa. He is grieving.  F/u urgency x 3 wks - in 2015, peeing a lot - resolved spontaneously. He saw a Dealer today  The patient presents for a follow-up of  chronic hypogonadism, allergies and asthma   Review of Systems  Constitutional: Negative for appetite change, fatigue and unexpected weight change.  HENT: Negative for congestion, nosebleeds, sneezing, sore throat and trouble swallowing.   Eyes: Negative for itching and visual disturbance.  Respiratory: Negative for cough.   Cardiovascular: Negative for chest pain, palpitations and leg swelling.  Gastrointestinal: Negative for nausea, diarrhea, blood in stool and abdominal distention.  Genitourinary: Negative for frequency and hematuria.  Musculoskeletal: Negative for back pain, joint swelling, gait problem and neck pain.  Skin: Negative for rash.  Neurological: Negative for dizziness, tremors, speech difficulty and weakness.  Psychiatric/Behavioral: Negative for suicidal ideas, behavioral problems, confusion, sleep disturbance, dysphoric mood and agitation. The patient is not nervous/anxious.      Wt Readings from Last 3 Encounters:  04/24/14 223 lb (101.152 kg)  01/16/14 220 lb 1.9 oz (99.846 kg)  10/15/13 212 lb 4 oz (96.276 kg)   BP Readings from Last 3 Encounters:  04/24/14 150/90  01/16/14 172/84  10/15/13 140/78       Objective:   Physical Exam  Constitutional: He is oriented to person, place, and time. He appears well-developed. No distress.  NAD  HENT:  Mouth/Throat: Oropharynx is clear and moist.  Eyes: Conjunctivae are normal. Pupils are equal, round, and reactive to light.  Neck: Normal range of motion. No JVD present. No thyromegaly present.  Cardiovascular: Normal rate, regular rhythm, normal heart sounds and intact distal pulses.   Exam reveals no gallop and no friction rub.   No murmur heard. Pulmonary/Chest: Effort normal and breath sounds normal. No respiratory distress. He has no wheezes. He has no rales. He exhibits no tenderness.  Abdominal: Soft. Bowel sounds are normal. He exhibits no distension and no mass. There is no tenderness. There is no rebound and no guarding.  Musculoskeletal: Normal range of motion. He exhibits no edema or tenderness.  Lymphadenopathy:    He has no cervical adenopathy.  Neurological: He is alert and oriented to person, place, and time. He has normal reflexes. No cranial nerve deficit. He exhibits normal muscle tone. He displays a negative Romberg sign. Coordination and gait normal.  No meningeal signs  Skin: Skin is warm and dry. No rash noted.  Psychiatric: He has a normal mood and affect. His behavior is normal. Judgment and thought content normal.    Lab Results  Component Value Date   WBC 9.5 01/02/2013   HGB 15.4 01/02/2013   HCT 45.7 01/02/2013   PLT 299.0 01/02/2013   GLUCOSE 129* 01/02/2013   CHOL 172 01/02/2013   TRIG 211.0* 01/02/2013   HDL 37.60* 01/02/2013   LDLDIRECT 107.9 01/02/2013   ALT 27 01/02/2013   AST 21 01/02/2013   NA 138 01/02/2013   K 4.8 01/02/2013   CL 100 01/02/2013   CREATININE 1.1 01/02/2013   BUN 20 01/02/2013   CO2 30 01/02/2013   TSH 0.39 01/02/2013   PSA 0.85 01/02/2013   HGBA1C 5.5 01/02/2013         Assessment & Plan:

## 2014-04-24 NOTE — Progress Notes (Signed)
Pre visit review using our clinic review tool, if applicable. No additional management support is needed unless otherwise documented below in the visit note. 

## 2014-04-25 LAB — CBC WITH DIFFERENTIAL/PLATELET
BASOS ABS: 0.1 10*3/uL (ref 0.0–0.1)
BASOS PCT: 0.7 % (ref 0.0–3.0)
Eosinophils Absolute: 0.1 10*3/uL (ref 0.0–0.7)
Eosinophils Relative: 1.2 % (ref 0.0–5.0)
HEMATOCRIT: 51 % (ref 39.0–52.0)
Hemoglobin: 16.7 g/dL (ref 13.0–17.0)
LYMPHS ABS: 2.3 10*3/uL (ref 0.7–4.0)
Lymphocytes Relative: 23.3 % (ref 12.0–46.0)
MCHC: 32.8 g/dL (ref 30.0–36.0)
MCV: 96.4 fl (ref 78.0–100.0)
MONOS PCT: 8.9 % (ref 3.0–12.0)
Monocytes Absolute: 0.9 10*3/uL (ref 0.1–1.0)
Neutro Abs: 6.6 10*3/uL (ref 1.4–7.7)
Neutrophils Relative %: 65.9 % (ref 43.0–77.0)
Platelets: 326 10*3/uL (ref 150.0–400.0)
RBC: 5.29 Mil/uL (ref 4.22–5.81)
RDW: 13.2 % (ref 11.5–15.5)
WBC: 10 10*3/uL (ref 4.0–10.5)

## 2014-04-26 LAB — BASIC METABOLIC PANEL
BUN: 18 mg/dL (ref 6–23)
CHLORIDE: 106 meq/L (ref 96–112)
CO2: 28 meq/L (ref 19–32)
CREATININE: 1 mg/dL (ref 0.4–1.5)
Calcium: 9.7 mg/dL (ref 8.4–10.5)
GFR: 81.79 mL/min (ref >60.00)
GLUCOSE: 100 mg/dL — AB (ref 70–99)
Potassium: 5.2 mEq/L — ABNORMAL HIGH (ref 3.5–5.1)
Sodium: 142 mEq/L (ref 135–145)

## 2014-04-26 LAB — LIPID PANEL
CHOL/HDL RATIO: 3
Cholesterol: 199 mg/dL (ref 0–200)
HDL: 60.5 mg/dL (ref >39.00)
LDL CALC: 121 mg/dL — AB (ref 0–99)
NonHDL: 138.5
Triglycerides: 90 mg/dL (ref 0.0–149.0)
VLDL: 18 mg/dL (ref 0.0–40.0)

## 2014-04-26 LAB — PSA: PSA: 0.87 ng/mL (ref 0.10–4.00)

## 2014-04-26 LAB — HEPATIC FUNCTION PANEL
ALK PHOS: 64 U/L (ref 39–117)
ALT: 28 U/L (ref 0–53)
AST: 21 U/L (ref 0–37)
Albumin: 4.7 g/dL (ref 3.5–5.2)
BILIRUBIN DIRECT: 0.1 mg/dL (ref 0.0–0.3)
Total Bilirubin: 0.4 mg/dL (ref 0.2–1.2)
Total Protein: 7.1 g/dL (ref 6.0–8.3)

## 2014-04-26 LAB — TSH: TSH: 0.99 u[IU]/mL (ref 0.35–4.50)

## 2014-04-26 LAB — TESTOSTERONE: Testosterone: 372.65 ng/dL (ref 300.00–890.00)

## 2014-04-26 NOTE — Assessment & Plan Note (Signed)
Continue with current prescription therapy as reflected on the Med list.  Potential benefits of a long term amphetamines  use as well as potential risks  and complications were explained to the patient and were aknowledged.  

## 2014-04-26 NOTE — Assessment & Plan Note (Signed)
06/26/22 sister died 9/15 father died F/u w/Dr Cheryln Manly

## 2014-04-26 NOTE — Assessment & Plan Note (Signed)
Potential benefits of a long term testosterone  use as well as potential risks  and complications were explained to the patient and were aknowledged. Continue with current prescription therapy as reflected on the Med list.

## 2014-04-27 LAB — URINALYSIS
Bilirubin Urine: NEGATIVE
HGB URINE DIPSTICK: NEGATIVE
KETONES UR: NEGATIVE
Leukocytes, UA: NEGATIVE
Nitrite: NEGATIVE
Specific Gravity, Urine: 1.02 (ref 1.000–1.030)
Total Protein, Urine: NEGATIVE
URINE GLUCOSE: NEGATIVE
UROBILINOGEN UA: 0.2 (ref 0.0–1.0)
pH: 6.5 (ref 5.0–8.0)

## 2014-04-30 ENCOUNTER — Ambulatory Visit: Payer: 59 | Admitting: Psychology

## 2014-05-07 ENCOUNTER — Ambulatory Visit (INDEPENDENT_AMBULATORY_CARE_PROVIDER_SITE_OTHER): Payer: 59 | Admitting: Psychology

## 2014-05-07 DIAGNOSIS — F4321 Adjustment disorder with depressed mood: Secondary | ICD-10-CM

## 2014-05-14 ENCOUNTER — Ambulatory Visit (INDEPENDENT_AMBULATORY_CARE_PROVIDER_SITE_OTHER): Payer: 59 | Admitting: Psychology

## 2014-05-14 DIAGNOSIS — F4321 Adjustment disorder with depressed mood: Secondary | ICD-10-CM

## 2014-05-20 ENCOUNTER — Ambulatory Visit (INDEPENDENT_AMBULATORY_CARE_PROVIDER_SITE_OTHER): Payer: 59 | Admitting: Psychology

## 2014-05-20 DIAGNOSIS — F4321 Adjustment disorder with depressed mood: Secondary | ICD-10-CM

## 2014-05-21 ENCOUNTER — Ambulatory Visit: Payer: 59 | Admitting: Psychology

## 2014-05-28 ENCOUNTER — Ambulatory Visit (INDEPENDENT_AMBULATORY_CARE_PROVIDER_SITE_OTHER): Payer: 59 | Admitting: Psychology

## 2014-05-28 DIAGNOSIS — F4321 Adjustment disorder with depressed mood: Secondary | ICD-10-CM

## 2014-06-04 ENCOUNTER — Ambulatory Visit: Payer: 59 | Admitting: Psychology

## 2014-06-11 ENCOUNTER — Ambulatory Visit: Payer: 59 | Admitting: Psychology

## 2014-06-18 ENCOUNTER — Ambulatory Visit (INDEPENDENT_AMBULATORY_CARE_PROVIDER_SITE_OTHER): Payer: 59 | Admitting: Psychology

## 2014-06-18 DIAGNOSIS — F4321 Adjustment disorder with depressed mood: Secondary | ICD-10-CM

## 2014-06-25 ENCOUNTER — Ambulatory Visit (INDEPENDENT_AMBULATORY_CARE_PROVIDER_SITE_OTHER): Payer: 59 | Admitting: Psychology

## 2014-06-25 DIAGNOSIS — F4321 Adjustment disorder with depressed mood: Secondary | ICD-10-CM

## 2014-07-02 ENCOUNTER — Ambulatory Visit (INDEPENDENT_AMBULATORY_CARE_PROVIDER_SITE_OTHER): Payer: 59 | Admitting: Psychology

## 2014-07-02 DIAGNOSIS — F4321 Adjustment disorder with depressed mood: Secondary | ICD-10-CM

## 2014-07-09 ENCOUNTER — Ambulatory Visit (INDEPENDENT_AMBULATORY_CARE_PROVIDER_SITE_OTHER): Payer: 59 | Admitting: Psychology

## 2014-07-09 DIAGNOSIS — F4321 Adjustment disorder with depressed mood: Secondary | ICD-10-CM

## 2014-07-16 ENCOUNTER — Ambulatory Visit (INDEPENDENT_AMBULATORY_CARE_PROVIDER_SITE_OTHER): Payer: 59 | Admitting: Psychology

## 2014-07-16 DIAGNOSIS — F4321 Adjustment disorder with depressed mood: Secondary | ICD-10-CM

## 2014-07-20 ENCOUNTER — Encounter: Payer: Self-pay | Admitting: Internal Medicine

## 2014-07-20 ENCOUNTER — Other Ambulatory Visit (INDEPENDENT_AMBULATORY_CARE_PROVIDER_SITE_OTHER): Payer: 59

## 2014-07-20 ENCOUNTER — Ambulatory Visit (INDEPENDENT_AMBULATORY_CARE_PROVIDER_SITE_OTHER): Payer: 59 | Admitting: Internal Medicine

## 2014-07-20 VITALS — BP 140/86 | HR 73 | Temp 98.9°F | Ht 78.0 in | Wt 224.0 lb

## 2014-07-20 DIAGNOSIS — R5383 Other fatigue: Secondary | ICD-10-CM

## 2014-07-20 DIAGNOSIS — R109 Unspecified abdominal pain: Secondary | ICD-10-CM

## 2014-07-20 LAB — CBC WITH DIFFERENTIAL/PLATELET
BASOS ABS: 0.1 10*3/uL (ref 0.0–0.1)
Basophils Relative: 1.1 % (ref 0.0–3.0)
EOS ABS: 0.1 10*3/uL (ref 0.0–0.7)
Eosinophils Relative: 0.9 % (ref 0.0–5.0)
HCT: 46.7 % (ref 39.0–52.0)
Hemoglobin: 16 g/dL (ref 13.0–17.0)
LYMPHS ABS: 1.9 10*3/uL (ref 0.7–4.0)
Lymphocytes Relative: 18.4 % (ref 12.0–46.0)
MCHC: 34.3 g/dL (ref 30.0–36.0)
MCV: 93.4 fl (ref 78.0–100.0)
MONO ABS: 0.9 10*3/uL (ref 0.1–1.0)
MONOS PCT: 9.3 % (ref 3.0–12.0)
Neutro Abs: 7.2 10*3/uL (ref 1.4–7.7)
Neutrophils Relative %: 70.3 % (ref 43.0–77.0)
PLATELETS: 370 10*3/uL (ref 150.0–400.0)
RBC: 5 Mil/uL (ref 4.22–5.81)
RDW: 13.4 % (ref 11.5–15.5)
WBC: 10.3 10*3/uL (ref 4.0–10.5)

## 2014-07-20 LAB — HEPATIC FUNCTION PANEL
ALT: 25 U/L (ref 0–53)
AST: 17 U/L (ref 0–37)
Albumin: 4.6 g/dL (ref 3.5–5.2)
Alkaline Phosphatase: 68 U/L (ref 39–117)
Bilirubin, Direct: 0.1 mg/dL (ref 0.0–0.3)
Total Bilirubin: 0.6 mg/dL (ref 0.2–1.2)
Total Protein: 7.4 g/dL (ref 6.0–8.3)

## 2014-07-20 LAB — BASIC METABOLIC PANEL
BUN: 22 mg/dL (ref 6–23)
CO2: 28 mEq/L (ref 19–32)
Calcium: 9.5 mg/dL (ref 8.4–10.5)
Chloride: 105 mEq/L (ref 96–112)
Creatinine, Ser: 1.06 mg/dL (ref 0.40–1.50)
GFR: 75.53 mL/min (ref >60.00)
Glucose, Bld: 97 mg/dL (ref 70–99)
POTASSIUM: 4.5 meq/L (ref 3.5–5.1)
SODIUM: 139 meq/L (ref 135–145)

## 2014-07-20 LAB — URINALYSIS
BILIRUBIN URINE: NEGATIVE
HGB URINE DIPSTICK: NEGATIVE
Ketones, ur: NEGATIVE
Leukocytes, UA: NEGATIVE
NITRITE: NEGATIVE
Specific Gravity, Urine: >=1.03 — AB (ref 1.000–1.030)
Total Protein, Urine: NEGATIVE
Urine Glucose: NEGATIVE
Urobilinogen, UA: 0.2 (ref 0.0–1.0)
pH: 6 (ref 5.0–8.0)

## 2014-07-20 LAB — TSH: TSH: 0.52 u[IU]/mL (ref 0.35–4.50)

## 2014-07-20 MED ORDER — TRAMADOL HCL 50 MG PO TABS: 50.0000 mg | ORAL_TABLET | Freq: Three times a day (TID) | ORAL | Status: DC | PRN

## 2014-07-20 NOTE — Progress Notes (Signed)
Pre visit review using our clinic review tool, if applicable. No additional management support is needed unless otherwise documented below in the visit note. 

## 2014-07-20 NOTE — Patient Instructions (Signed)
  Your next office appointment will be determined based upon review of your pending labs   Those instructions will be transmitted to you through My Chart   Critical values will be called.  Followup as needed for any active or acute issue. Please report any significant change in your symptoms. 

## 2014-07-20 NOTE — Progress Notes (Signed)
   Subjective:    Patient ID: Victor Montes, male    DOB: January 12, 1956, 59 y.o.   MRN: [id number]  HPI   He describes bilateral flank pain since 07/07/14 without trigger or injury.   The discomfort is constant; it is described as a level II-VII. He has urinary frequency but no other genitourinary symptoms. His semen has become more yellow/tan.  Stool is more dense in consistency and is less frequent.  Over the weekend 2/26-2/28 he was bedbound because of marked fatigue without constitutional symptoms. There is no family or personal history of genitourinary disease or procedures.  Review of Systems He specifically denies fever, chills, sweats, change in weight. He has no hematuria, pyuria, or dysuria. He also denies nocturia. Unexplained weight loss, abdominal pain, significant dyspepsia, dysphagia, melena, rectal bleeding, or persistently small caliber stools are denied. There is no pain from the flank radiating anteriorly. His has no associated rash or lesions in the area the pain. He did have a "cold" 5 weeks ago which lasted one week. He was producing scant yellow sputum in the morning.     Objective:   Physical Exam  General appearance :adequately nourished; in no distress. Eyes: No conjunctival inflammation or scleral icterus is present. Oral exam:  Lips and gums are healthy appearing.He has mild erythema of the hard palate; he is status post uvuloplasty.There is no exudate noted. Dental hygiene is good.  Neck w/o masses; Thyroid normal.  Heart:  Normal rate and regular rhythm. S1 and S2 normal without gallop, murmur, click, rub or other extra sounds    Lungs:Chest clear to auscultation; no wheezes, rhonchi,rales ,or rubs present.No increased work of breathing.   Abdomen: bowel sounds normal, soft and non-tender without masses, organomegaly or hernias noted.  No guarding or rebound. No flank tenderness to percussion.Aorta palpable ; no AAA  Vascular : all pulses equal ; no  bruits present.  Skin:Warm & dry.  Intact without suspicious lesions or rashes ; no tenting or jaundice   Lymphatic: No lymphadenopathy is noted about the head, neck, axilla   Neuro: Strength, tone & DTRs normal.  He has slight crepitus of the knees.   Gait to include heel and toe walking was normal. He has negative straight leg raising to almost 90.      Assessment & Plan:  #1 bilateral flank pain with frequency  #2 profound fatigue 2/26-2/28  See orders

## 2014-07-21 LAB — CULTURE, URINE COMPREHENSIVE
COLONY COUNT: NO GROWTH
Organism ID, Bacteria: NO GROWTH

## 2014-07-23 ENCOUNTER — Ambulatory Visit: Payer: 59 | Admitting: Psychology

## 2014-07-27 ENCOUNTER — Other Ambulatory Visit: Payer: Self-pay | Admitting: Internal Medicine

## 2014-07-30 ENCOUNTER — Ambulatory Visit: Payer: 59 | Admitting: Psychology

## 2014-08-06 ENCOUNTER — Ambulatory Visit: Payer: 59 | Admitting: Psychology

## 2014-08-20 ENCOUNTER — Ambulatory Visit: Payer: 59 | Admitting: Psychology

## 2014-08-21 ENCOUNTER — Ambulatory Visit: Payer: 59 | Admitting: Psychology

## 2014-08-27 ENCOUNTER — Ambulatory Visit (INDEPENDENT_AMBULATORY_CARE_PROVIDER_SITE_OTHER): Payer: 59 | Admitting: Psychology

## 2014-08-27 DIAGNOSIS — F411 Generalized anxiety disorder: Secondary | ICD-10-CM | POA: Diagnosis not present

## 2014-09-03 ENCOUNTER — Ambulatory Visit (INDEPENDENT_AMBULATORY_CARE_PROVIDER_SITE_OTHER): Payer: 59 | Admitting: Psychology

## 2014-09-03 DIAGNOSIS — F4321 Adjustment disorder with depressed mood: Secondary | ICD-10-CM | POA: Diagnosis not present

## 2014-09-17 ENCOUNTER — Ambulatory Visit (INDEPENDENT_AMBULATORY_CARE_PROVIDER_SITE_OTHER): Payer: 59 | Admitting: Psychology

## 2014-09-17 DIAGNOSIS — F4321 Adjustment disorder with depressed mood: Secondary | ICD-10-CM

## 2014-09-22 ENCOUNTER — Telehealth: Payer: Self-pay | Admitting: Internal Medicine

## 2014-09-22 NOTE — Telephone Encounter (Signed)
OK to fill this prescription with additional refills x0 if time Thank you!  

## 2014-09-22 NOTE — Telephone Encounter (Signed)
Patient requesting refill for amphetamine-dextroamphetamine (ADDERALL) 10 MG tablet [[id number]]

## 2014-09-22 NOTE — Telephone Encounter (Signed)
Please advise, thanks.

## 2014-09-23 ENCOUNTER — Other Ambulatory Visit: Payer: Self-pay

## 2014-09-23 MED ORDER — AMPHETAMINE-DEXTROAMPHETAMINE 10 MG PO TABS: 10.0000 mg | ORAL_TABLET | Freq: Two times a day (BID) | ORAL | Status: DC

## 2014-09-23 NOTE — Telephone Encounter (Signed)
Left message advising patient rx will be waiting at front office for him to pick up

## 2014-09-24 ENCOUNTER — Ambulatory Visit (INDEPENDENT_AMBULATORY_CARE_PROVIDER_SITE_OTHER): Payer: 59 | Admitting: Psychology

## 2014-09-24 DIAGNOSIS — F4321 Adjustment disorder with depressed mood: Secondary | ICD-10-CM | POA: Diagnosis not present

## 2014-10-01 ENCOUNTER — Ambulatory Visit (INDEPENDENT_AMBULATORY_CARE_PROVIDER_SITE_OTHER): Payer: 59 | Admitting: Psychology

## 2014-10-01 DIAGNOSIS — F4321 Adjustment disorder with depressed mood: Secondary | ICD-10-CM | POA: Diagnosis not present

## 2014-10-08 ENCOUNTER — Ambulatory Visit: Payer: 59 | Admitting: Psychology

## 2014-10-21 ENCOUNTER — Other Ambulatory Visit: Payer: Self-pay | Admitting: Internal Medicine

## 2014-10-22 ENCOUNTER — Ambulatory Visit: Payer: 59 | Admitting: Psychology

## 2014-10-28 ENCOUNTER — Ambulatory Visit (INDEPENDENT_AMBULATORY_CARE_PROVIDER_SITE_OTHER): Payer: 59 | Admitting: Internal Medicine

## 2014-10-28 ENCOUNTER — Other Ambulatory Visit (INDEPENDENT_AMBULATORY_CARE_PROVIDER_SITE_OTHER): Payer: 59

## 2014-10-28 ENCOUNTER — Encounter: Payer: Self-pay | Admitting: Internal Medicine

## 2014-10-28 VITALS — BP 142/90 | HR 62 | Ht 78.0 in | Wt 228.0 lb

## 2014-10-28 DIAGNOSIS — N528 Other male erectile dysfunction: Secondary | ICD-10-CM

## 2014-10-28 DIAGNOSIS — Z0000 Encounter for general adult medical examination without abnormal findings: Secondary | ICD-10-CM | POA: Diagnosis not present

## 2014-10-28 DIAGNOSIS — N529 Male erectile dysfunction, unspecified: Secondary | ICD-10-CM | POA: Insufficient documentation

## 2014-10-28 DIAGNOSIS — M25511 Pain in right shoulder: Secondary | ICD-10-CM

## 2014-10-28 DIAGNOSIS — E291 Testicular hypofunction: Secondary | ICD-10-CM

## 2014-10-28 LAB — URINALYSIS
BILIRUBIN URINE: NEGATIVE
HGB URINE DIPSTICK: NEGATIVE
Ketones, ur: NEGATIVE
LEUKOCYTES UA: NEGATIVE
Nitrite: NEGATIVE
Specific Gravity, Urine: <=1.005 — AB (ref 1.000–1.030)
Total Protein, Urine: NEGATIVE
Urine Glucose: NEGATIVE
Urobilinogen, UA: 0.2 (ref 0.0–1.0)
pH: 6.5 (ref 5.0–8.0)

## 2014-10-28 LAB — BASIC METABOLIC PANEL
BUN: 13 mg/dL (ref 6–23)
CHLORIDE: 101 meq/L (ref 96–112)
CO2: 32 meq/L (ref 19–32)
CREATININE: 0.93 mg/dL (ref 0.40–1.50)
Calcium: 9.4 mg/dL (ref 8.4–10.5)
GFR: 87.76 mL/min (ref >60.00)
Glucose, Bld: 100 mg/dL — ABNORMAL HIGH (ref 70–99)
POTASSIUM: 4.7 meq/L (ref 3.5–5.1)
Sodium: 137 mEq/L (ref 135–145)

## 2014-10-28 LAB — LIPID PANEL
Cholesterol: 181 mg/dL (ref 0–200)
HDL: 52.5 mg/dL (ref >39.00)
LDL CALC: 116 mg/dL — AB (ref 0–99)
NonHDL: 128.5
Total CHOL/HDL Ratio: 3
Triglycerides: 65 mg/dL (ref 0.0–149.0)
VLDL: 13 mg/dL (ref 0.0–40.0)

## 2014-10-28 LAB — HEPATIC FUNCTION PANEL
ALT: 26 U/L (ref 0–53)
AST: 21 U/L (ref 0–37)
Albumin: 4.8 g/dL (ref 3.5–5.2)
Alkaline Phosphatase: 59 U/L (ref 39–117)
BILIRUBIN DIRECT: 0.2 mg/dL (ref 0.0–0.3)
BILIRUBIN TOTAL: 1.2 mg/dL (ref 0.2–1.2)
Total Protein: 7.2 g/dL (ref 6.0–8.3)

## 2014-10-28 LAB — CBC WITH DIFFERENTIAL/PLATELET
Basophils Absolute: 0 10*3/uL (ref 0.0–0.1)
Basophils Relative: 0.2 % (ref 0.0–3.0)
EOS PCT: 0.7 % (ref 0.0–5.0)
Eosinophils Absolute: 0.1 10*3/uL (ref 0.0–0.7)
HEMATOCRIT: 52.1 % — AB (ref 39.0–52.0)
HEMOGLOBIN: 17.5 g/dL — AB (ref 13.0–17.0)
LYMPHS ABS: 1.9 10*3/uL (ref 0.7–4.0)
Lymphocytes Relative: 16.3 % (ref 12.0–46.0)
MCHC: 33.7 g/dL (ref 30.0–36.0)
MCV: 93.9 fl (ref 78.0–100.0)
MONO ABS: 0.9 10*3/uL (ref 0.1–1.0)
MONOS PCT: 8.1 % (ref 3.0–12.0)
NEUTROS PCT: 74.7 % (ref 43.0–77.0)
Neutro Abs: 8.7 10*3/uL — ABNORMAL HIGH (ref 1.4–7.7)
PLATELETS: 309 10*3/uL (ref 150.0–400.0)
RBC: 5.55 Mil/uL (ref 4.22–5.81)
RDW: 14.1 % (ref 11.5–15.5)
WBC: 11.6 10*3/uL — AB (ref 4.0–10.5)

## 2014-10-28 LAB — TSH: TSH: 0.69 u[IU]/mL (ref 0.35–4.50)

## 2014-10-28 LAB — TESTOSTERONE: TESTOSTERONE: 1436.03 ng/dL — AB (ref 300.00–890.00)

## 2014-10-28 LAB — PSA: PSA: 2.02 ng/mL (ref 0.10–4.00)

## 2014-10-28 MED ORDER — "SYRINGE/NEEDLE (DISP) 25G X 1"" 3 ML MISC": Status: DC

## 2014-10-28 MED ORDER — SILDENAFIL CITRATE 100 MG PO TABS: 100.0000 mg | ORAL_TABLET | ORAL | Status: DC | PRN

## 2014-10-28 MED ORDER — AMPHETAMINE-DEXTROAMPHETAMINE 10 MG PO TABS: 10.0000 mg | ORAL_TABLET | Freq: Two times a day (BID) | ORAL | Status: DC

## 2014-10-28 MED ORDER — TESTOSTERONE CYPIONATE 200 MG/ML IM SOLN: INTRAMUSCULAR | Status: DC

## 2014-10-28 MED ORDER — AMPHETAMINE-DEXTROAMPHETAMINE 10 MG PO TABS
10.0000 mg | ORAL_TABLET | Freq: Two times a day (BID) | ORAL | Status: DC
Start: 2014-10-28 — End: 2014-10-28

## 2014-10-28 MED ORDER — METAXALONE 800 MG PO TABS: 800.0000 mg | ORAL_TABLET | Freq: Three times a day (TID) | ORAL | Status: DC | PRN

## 2014-10-28 MED ORDER — PREDNISONE 10 MG PO TABS: ORAL_TABLET | ORAL | Status: DC

## 2014-10-28 NOTE — Assessment & Plan Note (Signed)
R recurrent Radiculopathy Prednisone 10 mg: take 4 tabs a day x 3 days; then 3 tabs a day x 4 days; then 2 tabs a day x 4 days, then 1 tab a day x 6 days, then stop. Take pc. Skelaxin prn

## 2014-10-28 NOTE — Assessment & Plan Note (Signed)
On Rx since 2010  Potential benefits of a long term testosterone use as well as potential risks  and complications were explained to the patient and were aknowledged.  On testosterone

## 2014-10-28 NOTE — Progress Notes (Signed)
Pre visit review using our clinic review tool, if applicable. No additional management support is needed unless otherwise documented below in the visit note. 

## 2014-10-28 NOTE — Progress Notes (Signed)
Subjective:     HPI   The patient is here for a wellness exam. C/o R neck/shoulder pain relapsed - bad, can't sleep  C/o ED  Mendel's dad died in 21-Feb-2014  F/u anxiety and insomnia - his sister died suddenly a few months ago - winter 2015 - he found her dead on the sofa. He is grieving.  F/u urgency x 3 wks - in 2015, peeing a lot - resolved spontaneously. He saw a Dealer today  The patient presents for a follow-up of  chronic hypogonadism, allergies and asthma   Review of Systems  Constitutional: Negative for appetite change, fatigue and unexpected weight change.  HENT: Negative for congestion, nosebleeds, sneezing, sore throat and trouble swallowing.   Eyes: Negative for itching and visual disturbance.  Respiratory: Negative for cough.   Cardiovascular: Negative for chest pain, palpitations and leg swelling.  Gastrointestinal: Negative for nausea, diarrhea, blood in stool and abdominal distention.  Genitourinary: Negative for frequency and hematuria.  Musculoskeletal: Negative for back pain, joint swelling, gait problem and neck pain.  Skin: Negative for rash.  Neurological: Negative for dizziness, tremors, speech difficulty and weakness.  Psychiatric/Behavioral: Negative for suicidal ideas, behavioral problems, confusion, sleep disturbance, dysphoric mood and agitation. The patient is not nervous/anxious.      Wt Readings from Last 3 Encounters:  10/28/14 228 lb (103.42 kg)  07/20/14 224 lb (101.606 kg)  04/24/14 223 lb (101.152 kg)   BP Readings from Last 3 Encounters:  10/28/14 142/90  07/20/14 140/86  04/24/14 150/90       Objective:   Physical Exam  Constitutional: He is oriented to person, place, and time. He appears well-developed. No distress.  NAD  HENT:  Mouth/Throat: Oropharynx is clear and moist.  Eyes: Conjunctivae are normal. Pupils are equal, round, and reactive to light.  Neck: Normal range of motion. No JVD present. No thyromegaly present.   Cardiovascular: Normal rate, regular rhythm, normal heart sounds and intact distal pulses.  Exam reveals no gallop and no friction rub.   No murmur heard. Pulmonary/Chest: Effort normal and breath sounds normal. No respiratory distress. He has no wheezes. He has no rales. He exhibits no tenderness.  Abdominal: Soft. Bowel sounds are normal. He exhibits no distension and no mass. There is no tenderness. There is no rebound and no guarding.  Musculoskeletal: Normal range of motion. He exhibits no edema or tenderness.  Lymphadenopathy:    He has no cervical adenopathy.  Neurological: He is alert and oriented to person, place, and time. He has normal reflexes. No cranial nerve deficit. He exhibits normal muscle tone. He displays a negative Romberg sign. Coordination and gait normal.  No meningeal signs  Skin: Skin is warm and dry. No rash noted.  Psychiatric: He has a normal mood and affect. His behavior is normal. Judgment and thought content normal.  R shoulder and trap are tender w/ROM   Lab Results  Component Value Date   WBC 10.3 07/20/2014   HGB 16.0 07/20/2014   HCT 46.7 07/20/2014   PLT 370.0 07/20/2014   GLUCOSE 97 07/20/2014   CHOL 199 04/24/2014   TRIG 90.0 04/24/2014   HDL 60.50 04/24/2014   LDLDIRECT 107.9 01/02/2013   LDLCALC 121* 04/24/2014   ALT 25 07/20/2014   AST 17 07/20/2014   NA 139 07/20/2014   K 4.5 07/20/2014   CL 105 07/20/2014   CREATININE 1.06 07/20/2014   BUN 22 07/20/2014   CO2 28 07/20/2014   TSH 0.52  07/20/2014   PSA 0.87 04/24/2014   HGBA1C 5.5 01/02/2013         Assessment & Plan:

## 2014-10-28 NOTE — Assessment & Plan Note (Signed)
We discussed age appropriate health related issues, including available/recomended screening tests and vaccinations. We discussed a need for adhering to healthy diet and exercise. Labs/EKG were reviewed/ordered. All questions were answered.  Labs 

## 2014-10-28 NOTE — Assessment & Plan Note (Signed)
Viagra prn 

## 2014-10-29 ENCOUNTER — Ambulatory Visit: Payer: 59 | Admitting: Psychology

## 2014-11-12 ENCOUNTER — Ambulatory Visit: Payer: 59 | Admitting: Psychology

## 2014-11-19 ENCOUNTER — Ambulatory Visit: Payer: 59 | Admitting: Psychology

## 2014-12-03 ENCOUNTER — Encounter: Payer: Self-pay | Admitting: Gastroenterology

## 2015-01-27 ENCOUNTER — Other Ambulatory Visit: Payer: Self-pay | Admitting: Internal Medicine

## 2015-02-03 ENCOUNTER — Telehealth: Payer: Self-pay | Admitting: *Deleted

## 2015-02-03 MED ORDER — AMPHETAMINE-DEXTROAMPHETAMINE 10 MG PO TABS: 10.0000 mg | ORAL_TABLET | Freq: Two times a day (BID) | ORAL | Status: DC

## 2015-02-03 NOTE — Telephone Encounter (Signed)
Printed rx md sign called pt no answer LMOM rx ready for pick-up...[name]

## 2015-02-03 NOTE — Telephone Encounter (Signed)
OK to fill this prescription with additional refills x0 Thank you!  

## 2015-02-03 NOTE — Telephone Encounter (Signed)
Receive call pt is requesting refill on his Adderral.../lmb

## 2015-03-08 ENCOUNTER — Telehealth: Payer: Self-pay | Admitting: *Deleted

## 2015-03-08 MED ORDER — AMPHETAMINE-DEXTROAMPHETAMINE 10 MG PO TABS: 10.0000 mg | ORAL_TABLET | Freq: Two times a day (BID) | ORAL | Status: DC

## 2015-03-08 NOTE — Telephone Encounter (Signed)
Receive call pt is requesting refill on his Adderral. MD out of office this week pls advise...[name]

## 2015-03-08 NOTE — Telephone Encounter (Signed)
done

## 2015-03-08 NOTE — Telephone Encounter (Signed)
Notified pt rx ready for pick-up.../lmb 

## 2015-04-12 ENCOUNTER — Telehealth: Payer: Self-pay | Admitting: Internal Medicine

## 2015-04-12 NOTE — Telephone Encounter (Signed)
Pt requesting refill for amphetamine-dextroamphetamine (ADDERALL) 10 MG tablet [id number]

## 2015-04-14 MED ORDER — AMPHETAMINE-DEXTROAMPHETAMINE 10 MG PO TABS
10.0000 mg | ORAL_TABLET | Freq: Two times a day (BID) | ORAL | Status: DC
Start: 2015-04-14 — End: 2015-05-19

## 2015-04-14 NOTE — Telephone Encounter (Signed)
OK to fill this prescription with additional refills x0 Thank you!  

## 2015-04-14 NOTE — Telephone Encounter (Signed)
Please advise 

## 2015-05-10 ENCOUNTER — Other Ambulatory Visit: Payer: Self-pay | Admitting: Internal Medicine

## 2015-05-13 NOTE — Telephone Encounter (Signed)
Done

## 2015-05-19 ENCOUNTER — Telehealth: Payer: Self-pay | Admitting: Internal Medicine

## 2015-05-19 MED ORDER — AMPHETAMINE-DEXTROAMPHETAMINE 10 MG PO TABS: 10.0000 mg | ORAL_TABLET | Freq: Two times a day (BID) | ORAL | Status: DC

## 2015-05-19 NOTE — Telephone Encounter (Signed)
Patient is requesting a refill for amphetamine-dextroamphetamine (ADDERALL) 10 MG tablet [id number] dated 05/21/2015

## 2015-05-19 NOTE — Telephone Encounter (Signed)
Done. Left detailed mess informing pt Rx is upfront for p/u.

## 2015-05-19 NOTE — Telephone Encounter (Signed)
OK to fill this prescription with additional refills x0 Thank you!  

## 2015-06-21 ENCOUNTER — Other Ambulatory Visit: Payer: Self-pay | Admitting: Internal Medicine

## 2015-06-24 ENCOUNTER — Telehealth: Payer: Self-pay | Admitting: *Deleted

## 2015-06-24 MED ORDER — AMPHETAMINE-DEXTROAMPHETAMINE 10 MG PO TABS: 10.0000 mg | ORAL_TABLET | Freq: Two times a day (BID) | ORAL | Status: DC

## 2015-06-24 NOTE — Telephone Encounter (Signed)
OK to fill this prescription with additional refills x0 Pt needs OV q 3 mo Thx Thank you!

## 2015-06-24 NOTE — Telephone Encounter (Signed)
Printed script will hold until MD return tomorrow to sign...[name]

## 2015-06-24 NOTE — Telephone Encounter (Signed)
Left msg on triage wanting refill on his Adderral.../lmb

## 2015-06-25 NOTE — Telephone Encounter (Signed)
MD sign script notified pt rx read yfor pick-up...[name]

## 2015-06-28 ENCOUNTER — Encounter: Payer: Self-pay | Admitting: Internal Medicine

## 2015-08-16 ENCOUNTER — Telehealth: Payer: Self-pay | Admitting: *Deleted

## 2015-08-16 NOTE — Telephone Encounter (Signed)
OK to fill this prescription with additional refills x0 (last Rx) Pls inform Katron - he has to be seen q 3 months Thank you!

## 2015-08-16 NOTE — Telephone Encounter (Signed)
Requesting refill on his Adderrall...Victor Montes

## 2015-08-17 MED ORDER — AMPHETAMINE-DEXTROAMPHETAMINE 10 MG PO TABS: 10.0000 mg | ORAL_TABLET | Freq: Two times a day (BID) | ORAL | Status: DC

## 2015-08-17 NOTE — Telephone Encounter (Signed)
Notified pt w/MD response put rx in cabinet for pick-up.../lmb 

## 2015-08-25 ENCOUNTER — Encounter: Payer: Self-pay | Admitting: Internal Medicine

## 2015-08-25 ENCOUNTER — Ambulatory Visit (INDEPENDENT_AMBULATORY_CARE_PROVIDER_SITE_OTHER): Payer: 59 | Admitting: Internal Medicine

## 2015-08-25 VITALS — BP 162/102 | HR 67 | Temp 98.4°F | Resp 16 | Ht 78.0 in | Wt 222.1 lb

## 2015-08-25 DIAGNOSIS — F909 Attention-deficit hyperactivity disorder, unspecified type: Secondary | ICD-10-CM | POA: Diagnosis not present

## 2015-08-25 DIAGNOSIS — E291 Testicular hypofunction: Secondary | ICD-10-CM

## 2015-08-25 DIAGNOSIS — R634 Abnormal weight loss: Secondary | ICD-10-CM

## 2015-08-25 DIAGNOSIS — M79672 Pain in left foot: Secondary | ICD-10-CM

## 2015-08-25 DIAGNOSIS — Z0000 Encounter for general adult medical examination without abnormal findings: Secondary | ICD-10-CM

## 2015-08-25 DIAGNOSIS — F988 Other specified behavioral and emotional disorders with onset usually occurring in childhood and adolescence: Secondary | ICD-10-CM

## 2015-08-25 DIAGNOSIS — M79673 Pain in unspecified foot: Secondary | ICD-10-CM | POA: Insufficient documentation

## 2015-08-25 MED ORDER — SERTRALINE HCL 50 MG PO TABS: 50.0000 mg | ORAL_TABLET | Freq: Every day | ORAL | Status: DC

## 2015-08-25 MED ORDER — NABUMETONE 500 MG PO TABS: 500.0000 mg | ORAL_TABLET | Freq: Two times a day (BID) | ORAL | Status: DC | PRN

## 2015-08-25 MED ORDER — AMPHETAMINE-DEXTROAMPHETAMINE 10 MG PO TABS: 10.0000 mg | ORAL_TABLET | Freq: Two times a day (BID) | ORAL | Status: DC

## 2015-08-25 MED ORDER — ALBUTEROL SULFATE HFA 108 (90 BASE) MCG/ACT IN AERS: 2.0000 | INHALATION_SPRAY | Freq: Four times a day (QID) | RESPIRATORY_TRACT | Status: DC | PRN

## 2015-08-25 MED ORDER — TESTOSTERONE CYPIONATE 200 MG/ML IM SOLN: INTRAMUSCULAR | Status: DC

## 2015-08-25 NOTE — Assessment & Plan Note (Signed)
Potential benefits of a long term testosterone use as well as potential risks  and complications were explained to the patient and were aknowledged.  On testosterone

## 2015-08-25 NOTE — Patient Instructions (Signed)
1/4 inch heel lift Ice

## 2015-08-25 NOTE — Progress Notes (Signed)
Pre visit review using our clinic review tool, if applicable. No additional management support is needed unless otherwise documented below in the visit note. 

## 2015-08-25 NOTE — Assessment & Plan Note (Signed)
Adderall Potential benefits of a long term stimulants use as well as potential risks  and complications were explained to the patient and were aknowledged. 

## 2015-08-25 NOTE — Progress Notes (Signed)
Subjective:  Patient ID: Victor Montes, male    DOB: 07/20/1955  Age: 60 y.o. MRN: [id number]  CC: No chief complaint on file.   HPI Victor Montes presents for ADD, hypogonadism, ED, depression f/u. C/o L heel injury when he stepped in a hole a while ago - painful  Outpatient Prescriptions Prior to Visit  Medication Sig Dispense Refill  . cholecalciferol (VITAMIN D) 1000 UNITS tablet Take 1,000 Units by mouth daily.      . sildenafil (VIAGRA) 100 MG tablet Take 1 tablet (100 mg total) by mouth as needed for erectile dysfunction. 30 tablet 11  . SYRINGE-NEEDLE, DISP, 3 ML (BD ECLIPSE SYRINGE) 25G X 1" 3 ML MISC USE AS DIRECTED FOR IM INJECTION 50 each 1  . albuterol (PROAIR HFA) 108 (90 BASE) MCG/ACT inhaler Inhale 2 puffs into the lungs every 6 (six) hours as needed. 1 Inhaler 11  . amphetamine-dextroamphetamine (ADDERALL) 10 MG tablet Take 1 tablet (10 mg total) by mouth 2 (two) times daily. 60 tablet 0  . sertraline (ZOLOFT) 50 MG tablet Take 1 tablet (50 mg total) by mouth daily. 90 tablet 3  . testosterone cypionate (DEPOTESTOSTERONE CYPIONATE) 200 MG/ML injection INJECT 2 ML INTRAMUSCULARLY EVERY 14 DAYS. 10 mL 2  . metaxalone (SKELAXIN) 800 MG tablet Take 1 tablet (800 mg total) by mouth 3 (three) times daily as needed for muscle spasms. (Patient not taking: Reported on 08/25/2015) 30 tablet 1  . predniSONE (DELTASONE) 10 MG tablet Prednisone 10 mg: take 4 tabs a day x 3 days; then 3 tabs a day x 4 days; then 2 tabs a day x 4 days, then 1 tab a day x 6 days, then stop. Take pc. (Patient not taking: Reported on 08/25/2015) 38 tablet 1  . traMADol (ULTRAM) 50 MG tablet Take 1 tablet (50 mg total) by mouth every 8 (eight) hours as needed. (Patient not taking: Reported on 10/28/2014) 30 tablet 0   Facility-Administered Medications Prior to Visit  Medication Dose Route Frequency Provider Last Rate Last Dose  . testosterone cypionate (DEPOTESTOTERONE CYPIONATE) injection 200 mg  200 mg  Intramuscular Q14 Days Aleksei Plotnikov V, MD   200 mg at 01/18/12 0933    ROS Review of Systems  Constitutional: Negative for appetite change, fatigue and unexpected weight change.  HENT: Negative for congestion, nosebleeds, sneezing, sore throat and trouble swallowing.   Eyes: Negative for itching and visual disturbance.  Respiratory: Negative for cough.   Cardiovascular: Negative for chest pain, palpitations and leg swelling.  Gastrointestinal: Negative for nausea, diarrhea, blood in stool and abdominal distention.  Genitourinary: Negative for frequency and hematuria.  Musculoskeletal: Positive for neck pain. Negative for back pain, joint swelling and gait problem.  Skin: Negative for rash.  Neurological: Negative for dizziness, tremors, speech difficulty, weakness and light-headedness.  Psychiatric/Behavioral: Negative for suicidal ideas, sleep disturbance, dysphoric mood and agitation. The patient is not nervous/anxious.     Objective:  BP 162/102 mmHg  Pulse 67  Temp(Src) 98.4 F (36.9 C) (Oral)  Resp 16  Ht 6\' 6"  (1.981 m)  Wt 222 lb 1.9 oz (100.753 kg)  BMI 25.67 kg/m2  SpO2 98%  BP Readings from Last 3 Encounters:  08/25/15 162/102  10/28/14 142/90  07/20/14 140/86    Wt Readings from Last 3 Encounters:  08/25/15 222 lb 1.9 oz (100.753 kg)  10/28/14 228 lb (103.42 kg)  07/20/14 224 lb (101.606 kg)    Physical Exam  Constitutional: He is oriented to person, place, and  time. He appears well-developed. No distress.  NAD  HENT:  Mouth/Throat: Oropharynx is clear and moist.  Eyes: Conjunctivae are normal. Pupils are equal, round, and reactive to light.  Neck: Normal range of motion. No JVD present. No thyromegaly present.  Cardiovascular: Normal rate, regular rhythm, normal heart sounds and intact distal pulses.  Exam reveals no gallop and no friction rub.   No murmur heard. Pulmonary/Chest: Effort normal and breath sounds normal. No respiratory distress. He has  no wheezes. He has no rales. He exhibits no tenderness.  Abdominal: Soft. Bowel sounds are normal. He exhibits no distension and no mass. There is no tenderness. There is no rebound and no guarding.  Musculoskeletal: Normal range of motion. He exhibits no edema or tenderness.  Lymphadenopathy:    He has no cervical adenopathy.  Neurological: He is alert and oriented to person, place, and time. He has normal reflexes. No cranial nerve deficit. He exhibits normal muscle tone. He displays a negative Romberg sign. Coordination and gait normal.  Skin: Skin is warm and dry. No rash noted.  Psychiatric: He has a normal mood and affect. His behavior is normal. Judgment and thought content normal.  L post heel is tender  Lab Results  Component Value Date   WBC 11.6* 10/28/2014   HGB 17.5* 10/28/2014   HCT 52.1* 10/28/2014   PLT 309.0 10/28/2014   GLUCOSE 100* 10/28/2014   CHOL 181 10/28/2014   TRIG 65.0 10/28/2014   HDL 52.50 10/28/2014   LDLDIRECT 107.9 01/02/2013   LDLCALC 116* 10/28/2014   ALT 26 10/28/2014   AST 21 10/28/2014   NA 137 10/28/2014   K 4.7 10/28/2014   CL 101 10/28/2014   CREATININE 0.93 10/28/2014   BUN 13 10/28/2014   CO2 32 10/28/2014   TSH 0.69 10/28/2014   PSA 2.02 10/28/2014   HGBA1C 5.5 01/02/2013    No results found.  Assessment & Plan:   Diagnoses and all orders for this visit:  Attention deficit disorder  WEIGHT LOSS  Male hypogonadism -     Testosterone; Future  Well adult exam -     Hepatic function panel; Future -     Basic metabolic panel; Future -     CBC with Differential/Platelet; Future -     Lipid panel; Future -     Hepatitis C antibody; Future -     PSA; Future -     Testosterone; Future -     TSH; Future -     Urinalysis; Future  Heel pain, left -     Ambulatory referral to Sports Medicine  Other orders -     albuterol (PROAIR HFA) 108 (90 Base) MCG/ACT inhaler; Inhale 2 puffs into the lungs every 6 (six) hours as needed  for wheezing or shortness of breath. -     sertraline (ZOLOFT) 50 MG tablet; Take 1 tablet (50 mg total) by mouth daily. -     testosterone cypionate (DEPOTESTOSTERONE CYPIONATE) 200 MG/ML injection; INJECT 2 ML INTRAMUSCULARLY EVERY 14 DAYS. -     Discontinue: amphetamine-dextroamphetamine (ADDERALL) 10 MG tablet; Take 1 tablet (10 mg total) by mouth 2 (two) times daily. -     amphetamine-dextroamphetamine (ADDERALL) 10 MG tablet; Take 1 tablet (10 mg total) by mouth 2 (two) times daily. -     nabumetone (RELAFEN) 500 MG tablet; Take 1 tablet (500 mg total) by mouth 2 (two) times daily as needed for moderate pain.  I have discontinued Mr. Kobs traMADol, predniSONE, and  metaxalone. I have also changed his albuterol. Additionally, I am having him start on nabumetone. Lastly, I am having him maintain his cholecalciferol, SYRINGE-NEEDLE (DISP) 3 ML, sildenafil, sertraline, testosterone cypionate, and amphetamine-dextroamphetamine. We will continue to administer testosterone cypionate.  Meds ordered this encounter  Medications  . albuterol (PROAIR HFA) 108 (90 Base) MCG/ACT inhaler    Sig: Inhale 2 puffs into the lungs every 6 (six) hours as needed for wheezing or shortness of breath.    Dispense:  3 Inhaler    Refill:  3  . sertraline (ZOLOFT) 50 MG tablet    Sig: Take 1 tablet (50 mg total) by mouth daily.    Dispense:  90 tablet    Refill:  3  . testosterone cypionate (DEPOTESTOSTERONE CYPIONATE) 200 MG/ML injection    Sig: INJECT 2 ML INTRAMUSCULARLY EVERY 14 DAYS.    Dispense:  10 mL    Refill:  2  . DISCONTD: amphetamine-dextroamphetamine (ADDERALL) 10 MG tablet    Sig: Take 1 tablet (10 mg total) by mouth 2 (two) times daily.    Dispense:  60 tablet    Refill:  0    Please fill on or after 09/24/15  . amphetamine-dextroamphetamine (ADDERALL) 10 MG tablet    Sig: Take 1 tablet (10 mg total) by mouth 2 (two) times daily.    Dispense:  60 tablet    Refill:  0    Please fill on or  after 10/25/15  . nabumetone (RELAFEN) 500 MG tablet    Sig: Take 1 tablet (500 mg total) by mouth 2 (two) times daily as needed for moderate pain.    Dispense:  60 tablet    Refill:  1     Follow-up: Return in about 3 months (around 11/24/2015) for Wellness Exam.  Walker Kehr, MD

## 2015-08-25 NOTE — Assessment & Plan Note (Signed)
Wt Readings from Last 3 Encounters:  08/25/15 222 lb 1.9 oz (100.753 kg)  10/28/14 228 lb (103.42 kg)  07/20/14 224 lb (101.606 kg)

## 2015-08-25 NOTE — Assessment & Plan Note (Signed)
Monitoring labs 

## 2015-08-25 NOTE — Assessment & Plan Note (Addendum)
4/17 L posterior heel pain 1/4 inch heel lift Ice Relafen Sport Med ref

## 2015-08-26 ENCOUNTER — Other Ambulatory Visit (INDEPENDENT_AMBULATORY_CARE_PROVIDER_SITE_OTHER): Payer: 59

## 2015-08-26 ENCOUNTER — Ambulatory Visit (INDEPENDENT_AMBULATORY_CARE_PROVIDER_SITE_OTHER): Payer: 59 | Admitting: Family Medicine

## 2015-08-26 ENCOUNTER — Encounter: Payer: Self-pay | Admitting: Family Medicine

## 2015-08-26 VITALS — BP 126/80 | HR 70 | Ht 78.0 in | Wt 222.0 lb

## 2015-08-26 DIAGNOSIS — M79672 Pain in left foot: Secondary | ICD-10-CM | POA: Diagnosis not present

## 2015-08-26 DIAGNOSIS — M7732 Calcaneal spur, left foot: Secondary | ICD-10-CM

## 2015-08-26 DIAGNOSIS — M7662 Achilles tendinitis, left leg: Secondary | ICD-10-CM

## 2015-08-26 DIAGNOSIS — M7752 Other enthesopathy of left foot: Secondary | ICD-10-CM | POA: Diagnosis not present

## 2015-08-26 DIAGNOSIS — M7730 Calcaneal spur, unspecified foot: Secondary | ICD-10-CM | POA: Insufficient documentation

## 2015-08-26 DIAGNOSIS — M7750 Other enthesopathy of unspecified foot: Secondary | ICD-10-CM | POA: Insufficient documentation

## 2015-08-26 NOTE — Patient Instructions (Signed)
Good to see you.  Ice 20 minutes 2 times daily. Usually after activity and before bed. Exercises 3 times a week.  pennsaid pinkie amount topically 2 times daily as needed.  Avoid jumping for maybe 2 weeks Heel lift in shoes for next 2-3 weeks  Continue the vitamin D If pain does not resolve see me again in 3 weeks.

## 2015-08-26 NOTE — Progress Notes (Signed)
Pre visit review using our clinic review tool, if applicable. No additional management support is needed unless otherwise documented below in the visit note. 

## 2015-08-26 NOTE — Assessment & Plan Note (Signed)
Patient does have a mild Achilles tendinitis as well as a posterior calcaneal bone spur. In addition it is a very small retrocalcaneal bursitis noted. We discussed with patient at great length. Patient is already improving. Patient is going to try a heel lift, topical anti-inflammatory's, home exercises, icing protocol. We discussed proper shoes. Patient will try to make these changes and come back and see me again in 3 weeks. If continuing have pain then we will consider formal physical therapy and potential bracing.

## 2015-08-26 NOTE — Progress Notes (Signed)
[name] [place], [place] [address] Phone: [phone] Subjective:    I'm seeing this patient by the request  of:  Walker Kehr, MD   CC: left heel pain  [id number] Victor Montes is a 60 y.o. male coming in with complaint of Left heel pain. 3 weeks ago patient did started on had significant pain immediately. Patient did not have any swelling or bruising. States that over the course of time and is slowly improving. Patient still states that if he tries to go up he'll or goes up a lot of stairs he has more discomfort. Seems to shoot up the posterior aspect of the leg. Denies any weakness or numbness. Did not notice any swelling. No nighttime pain. Rates the severity of pain now is 3 out of 10. Seems to be improving.     Past Medical History  Diagnosis Date  . Asthma   . ADD (attention deficit disorder)   . Hyperlipidemia   . Hypogonadism male   . Allergic rhinitis   . Glucose intolerance (impaired glucose tolerance)   . OSA (obstructive sleep apnea)   . Cervical radiculopathy 2007    Right- Dr. Hal Neer   . Constipation 2011  . Occipital neuralgia 2011    Left   Past Surgical History  Procedure Laterality Date  . Uvulopalatoplasty      s/p   Social History   Social History  . Marital Status: Single    Spouse Name: N/A  . Number of Children: N/A  . Years of Education: N/A   Social History Main Topics  . Smoking status: Current Some Day Smoker -- 0.10 packs/day for 20 years    Types: Cigarettes  . Smokeless tobacco: None  . Alcohol Use: Yes  . Drug Use: No  . Sexual Activity: Not Asked   Other Topics Concern  . None   Social History Narrative   No Known Allergies Family History  Problem Relation Age of Onset  . Colon cancer Mother   . Cancer Mother 60    colon ca  . Coronary artery disease Neg Hx   . Hypertension Other   . Stroke Father   . Heart disease Father     A fib    Past medical history,  social, surgical and family history all reviewed in electronic medical record.  No pertanent information unless stated regarding to the chief complaint.   Review of Systems: No headache, visual changes, nausea, vomiting, diarrhea, constipation, dizziness, abdominal pain, skin rash, fevers, chills, night sweats, weight loss, swollen lymph nodes, body aches, joint swelling, muscle aches, chest pain, shortness of breath, mood changes.   Objective Blood pressure 126/80, pulse 70, height 6\' 6"  (1.981 m), weight 222 lb (100.699 kg), SpO2 96 %.  General: No apparent distress alert and oriented x3 mood and affect normal, dressed appropriately.  HEENT: Pupils equal, extraocular movements intact  Respiratory: Patient's speak in full sentences and does not appear short of breath  Cardiovascular: No lower extremity edema, non tender, no erythema  Skin: Warm dry intact with no signs of infection or rash on extremities or on axial skeleton.  Abdomen: Soft nontender  Neuro: Cranial nerves II through XII are intact, neurovascularly intact in all extremities with 2+ DTRs and 2+ pulses.  Lymph: No lymphadenopathy of posterior or anterior cervical chain or axillae bilaterally.  Gait normal with good balance and coordination.  MSK:  Non tender with full range of motion and good stability and  symmetric strength and tone of shoulders, elbows, wrist, hip, knee and bilaterally.  Ankle:left No visible erythema or swelling.patient does have some mild breakdown of the longitudinal arch with mild overpronation of the hindfoot. Range of motion is full in all directions. Strength is 5/5 in all directions.mild crepitus with range of motionStable lateral and medial ligaments; squeeze test and kleiger test unremarkable; Talar dome nontender; No pain at base of 5th MT; No tenderness over cuboid; No tenderness over N spot or navicular prominence No tenderness on posterior aspects of lateral and medial malleolus No sign of  peroneal tendon subluxations or tenderness to palpation Negative tarsal tunnel tinel's Able to walk 4 steps. Contralateral ankle unremarkable  MSK US performed of: left ankle This study was ordered, performed, and interpreted by Charlann Boxer D.O.  Foot/Ankle:   All structures visualized.   Talar dome unremarkable except for some mild arthritis Ankle mortise without effusion. Peroneus longus and brevis tendons unremarkable on long and transverse views without sheath effusions.mild degenerative changes noted Posterior tibialis, flexor hallucis longus, and flexor digitorum longus tendons unremarkable on long and transverse views without sheath effusions.mild degenerative changes noted Achilles tendon chest no tethering the patient does have increasing Doppler flow. Mild nodule noted approximately 1 cm from insertion. Posterior calcaneal heel spur noted. Mild mitral calcaneal bursitis noted Anterior Talofibular Ligament and Calcaneofibular Ligaments unremarkable and intact. Deltoid Ligament unremarkable and intact. Plantar fascia intact and without effusion, normal thickness. No increased doppler signal, cap sign, or thickening of tibial cortex. Power doppler signal normal.  IMPRESSION:  Mild Achilles tendinitis with posterior heel spur and retrocalcaneal bursitis.  Procedure note [id number]; 15 minutes spent for Therapeutic exercises as stated in above notes.  This included exercises focusing on stretching, strengthening, with significant focus on eccentric aspects.  Basic range of motion exercises to allow proper full motion at ankle Stretching of the lower leg and hamstrings  Theraband exercises for the lower leg - inversion, eversion, dorsiflexion and plantarflexion each to be completed with a theraband Balance exercises to increase proprioception Weight bearing exercises to increase strength and balance Proper technique shown and discussed handout in great detail with ATC.  All questions were  discussed and answered.     Impression and Recommendations:     This case required medical decision making of moderate complexity.      Note: This dictation was prepared with Dragon dictation along with smaller phrase technology. Any transcriptional errors that result from this process are unintentional.

## 2015-09-16 ENCOUNTER — Ambulatory Visit: Payer: Self-pay | Admitting: Family Medicine

## 2015-11-24 ENCOUNTER — Encounter: Payer: 59 | Admitting: Internal Medicine

## 2015-11-29 ENCOUNTER — Other Ambulatory Visit (INDEPENDENT_AMBULATORY_CARE_PROVIDER_SITE_OTHER): Payer: 59

## 2015-11-29 DIAGNOSIS — Z0000 Encounter for general adult medical examination without abnormal findings: Secondary | ICD-10-CM

## 2015-11-29 DIAGNOSIS — E291 Testicular hypofunction: Secondary | ICD-10-CM | POA: Diagnosis not present

## 2015-11-29 LAB — CBC WITH DIFFERENTIAL/PLATELET
BASOS ABS: 0 10*3/uL (ref 0.0–0.1)
Basophils Relative: 0.3 % (ref 0.0–3.0)
EOS ABS: 0.1 10*3/uL (ref 0.0–0.7)
Eosinophils Relative: 0.7 % (ref 0.0–5.0)
HEMATOCRIT: 48.8 % (ref 39.0–52.0)
HEMOGLOBIN: 16.7 g/dL (ref 13.0–17.0)
LYMPHS PCT: 20 % (ref 12.0–46.0)
Lymphs Abs: 2.1 10*3/uL (ref 0.7–4.0)
MCHC: 34.2 g/dL (ref 30.0–36.0)
MCV: 95 fl (ref 78.0–100.0)
MONOS PCT: 10 % (ref 3.0–12.0)
Monocytes Absolute: 1.1 10*3/uL — ABNORMAL HIGH (ref 0.1–1.0)
NEUTROS PCT: 69 % (ref 43.0–77.0)
Neutro Abs: 7.4 10*3/uL (ref 1.4–7.7)
Platelets: 320 10*3/uL (ref 150.0–400.0)
RBC: 5.14 Mil/uL (ref 4.22–5.81)
RDW: 13.9 % (ref 11.5–15.5)
WBC: 10.7 10*3/uL — AB (ref 4.0–10.5)

## 2015-11-29 LAB — URINALYSIS
Bilirubin Urine: NEGATIVE
HGB URINE DIPSTICK: NEGATIVE
Ketones, ur: NEGATIVE
LEUKOCYTES UA: NEGATIVE
NITRITE: NEGATIVE
Specific Gravity, Urine: 1.015 (ref 1.000–1.030)
TOTAL PROTEIN, URINE-UPE24: NEGATIVE
Urine Glucose: NEGATIVE
Urobilinogen, UA: 0.2 (ref 0.0–1.0)
pH: 6 (ref 5.0–8.0)

## 2015-11-29 LAB — LIPID PANEL
CHOL/HDL RATIO: 4
Cholesterol: 201 mg/dL — ABNORMAL HIGH (ref 0–200)
HDL: 48.2 mg/dL (ref >39.00)
LDL Cholesterol: 122 mg/dL — ABNORMAL HIGH (ref 0–99)
NONHDL: 153.15
Triglycerides: 158 mg/dL — ABNORMAL HIGH (ref 0.0–149.0)
VLDL: 31.6 mg/dL (ref 0.0–40.0)

## 2015-11-29 LAB — HEPATIC FUNCTION PANEL
ALBUMIN: 4.5 g/dL (ref 3.5–5.2)
ALT: 23 U/L (ref 0–53)
AST: 16 U/L (ref 0–37)
Alkaline Phosphatase: 65 U/L (ref 39–117)
Bilirubin, Direct: 0.1 mg/dL (ref 0.0–0.3)
TOTAL PROTEIN: 6.6 g/dL (ref 6.0–8.3)
Total Bilirubin: 0.6 mg/dL (ref 0.2–1.2)

## 2015-11-29 LAB — BASIC METABOLIC PANEL
BUN: 20 mg/dL (ref 6–23)
CALCIUM: 9.6 mg/dL (ref 8.4–10.5)
CO2: 29 meq/L (ref 19–32)
Chloride: 103 mEq/L (ref 96–112)
Creatinine, Ser: 1.09 mg/dL (ref 0.40–1.50)
GFR: 72.81 mL/min (ref >60.00)
Glucose, Bld: 91 mg/dL (ref 70–99)
Potassium: 4.7 mEq/L (ref 3.5–5.1)
SODIUM: 138 meq/L (ref 135–145)

## 2015-11-29 LAB — TESTOSTERONE: Testosterone: 823.43 ng/dL (ref 300.00–890.00)

## 2015-11-29 LAB — PSA: PSA: 2.37 ng/mL (ref 0.10–4.00)

## 2015-11-29 LAB — TSH: TSH: 0.69 u[IU]/mL (ref 0.35–4.50)

## 2015-11-30 LAB — HEPATITIS C ANTIBODY: HCV Ab: NEGATIVE

## 2015-12-01 ENCOUNTER — Encounter: Payer: Self-pay | Admitting: Internal Medicine

## 2015-12-01 ENCOUNTER — Ambulatory Visit (INDEPENDENT_AMBULATORY_CARE_PROVIDER_SITE_OTHER): Payer: 59 | Admitting: Internal Medicine

## 2015-12-01 VITALS — BP 144/78 | HR 95 | Ht 78.0 in | Wt 234.0 lb

## 2015-12-01 DIAGNOSIS — E291 Testicular hypofunction: Secondary | ICD-10-CM | POA: Diagnosis not present

## 2015-12-01 DIAGNOSIS — F909 Attention-deficit hyperactivity disorder, unspecified type: Secondary | ICD-10-CM

## 2015-12-01 DIAGNOSIS — E785 Hyperlipidemia, unspecified: Secondary | ICD-10-CM | POA: Diagnosis not present

## 2015-12-01 DIAGNOSIS — R5383 Other fatigue: Secondary | ICD-10-CM

## 2015-12-01 DIAGNOSIS — F988 Other specified behavioral and emotional disorders with onset usually occurring in childhood and adolescence: Secondary | ICD-10-CM

## 2015-12-01 DIAGNOSIS — F4321 Adjustment disorder with depressed mood: Secondary | ICD-10-CM

## 2015-12-01 DIAGNOSIS — Z0000 Encounter for general adult medical examination without abnormal findings: Secondary | ICD-10-CM

## 2015-12-01 MED ORDER — AMPHETAMINE-DEXTROAMPHETAMINE 10 MG PO TABS: 10.0000 mg | ORAL_TABLET | Freq: Two times a day (BID) | ORAL | Status: DC

## 2015-12-01 MED ORDER — "SYRINGE/NEEDLE (DISP) 25G X 1"" 3 ML MISC": Status: DC

## 2015-12-01 MED ORDER — TESTOSTERONE CYPIONATE 200 MG/ML IM SOLN: INTRAMUSCULAR | Status: DC

## 2015-12-01 NOTE — Progress Notes (Signed)
Pre visit review using our clinic review tool, if applicable. No additional management support is needed unless otherwise documented below in the visit note. 

## 2015-12-01 NOTE — Assessment & Plan Note (Signed)
On Adderall Potential benefits of a long term stimulants use as well as potential risks  and complications were explained to the patient and were aknowledged. 

## 2015-12-01 NOTE — Progress Notes (Signed)
Subjective:  Patient ID: Victor Montes, male    DOB: 31-May-1955  Age: 60 y.o. MRN: [id number]  CC: Annual Exam   HPI Victor Montes presents for a well exam F/u ADD, hypogonadism, grief reaction  C/o L>>R posterior heel pain    Outpatient Prescriptions Prior to Visit  Medication Sig Dispense Refill  . albuterol (PROAIR HFA) 108 (90 Base) MCG/ACT inhaler Inhale 2 puffs into the lungs every 6 (six) hours as needed for wheezing or shortness of breath. 3 Inhaler 3  . amphetamine-dextroamphetamine (ADDERALL) 10 MG tablet Take 1 tablet (10 mg total) by mouth 2 (two) times daily. 60 tablet 0  . cholecalciferol (VITAMIN D) 1000 UNITS tablet Take 1,000 Units by mouth daily.      . nabumetone (RELAFEN) 500 MG tablet Take 1 tablet (500 mg total) by mouth 2 (two) times daily as needed for moderate pain. 60 tablet 1  . sertraline (ZOLOFT) 50 MG tablet Take 1 tablet (50 mg total) by mouth daily. 90 tablet 3  . sildenafil (VIAGRA) 100 MG tablet Take 1 tablet (100 mg total) by mouth as needed for erectile dysfunction. 30 tablet 11  . testosterone cypionate (DEPOTESTOSTERONE CYPIONATE) 200 MG/ML injection INJECT 2 ML INTRAMUSCULARLY EVERY 14 DAYS. 10 mL 2  . SYRINGE-NEEDLE, DISP, 3 ML (BD ECLIPSE SYRINGE) 25G X 1" 3 ML MISC USE AS DIRECTED FOR IM INJECTION 50 each 1   Facility-Administered Medications Prior to Visit  Medication Dose Route Frequency Provider Last Rate Last Dose  . testosterone cypionate (DEPOTESTOTERONE CYPIONATE) injection 200 mg  200 mg Intramuscular Q14 Days Cassandria Anger, MD   200 mg at 01/18/12 0933    ROS Review of Systems  Constitutional: Negative for appetite change, fatigue and unexpected weight change.  HENT: Negative for congestion, nosebleeds, sneezing, sore throat and trouble swallowing.   Eyes: Negative for itching and visual disturbance.  Respiratory: Negative for cough.   Cardiovascular: Negative for chest pain, palpitations and leg swelling.    Gastrointestinal: Negative for nausea, diarrhea, blood in stool and abdominal distention.  Genitourinary: Negative for frequency and hematuria.  Musculoskeletal: Negative for back pain, joint swelling, gait problem and neck pain.  Skin: Negative for rash.  Neurological: Negative for dizziness, tremors, speech difficulty and weakness.  Psychiatric/Behavioral: Negative for sleep disturbance, dysphoric mood and agitation. The patient is not nervous/anxious.     Objective:  BP 144/78 mmHg  Pulse 95  Ht 6\' 6"  (1.981 m)  Wt 234 lb (106.142 kg)  BMI 27.05 kg/m2  SpO2 95%  BP Readings from Last 3 Encounters:  12/01/15 144/78  08/26/15 126/80  08/25/15 162/102    Wt Readings from Last 3 Encounters:  12/01/15 234 lb (106.142 kg)  08/26/15 222 lb (100.699 kg)  08/25/15 222 lb 1.9 oz (100.753 kg)    Physical Exam  Constitutional: He is oriented to person, place, and time. He appears well-developed. No distress.  NAD  HENT:  Mouth/Throat: Oropharynx is clear and moist.  Eyes: Conjunctivae are normal. Pupils are equal, round, and reactive to light.  Neck: Normal range of motion. No JVD present. No thyromegaly present.  Cardiovascular: Normal rate, regular rhythm, normal heart sounds and intact distal pulses.  Exam reveals no gallop and no friction rub.   No murmur heard. Pulmonary/Chest: Effort normal and breath sounds normal. No respiratory distress. He has no wheezes. He has no rales. He exhibits no tenderness.  Abdominal: Soft. Bowel sounds are normal. He exhibits no distension and no mass. There is no  tenderness. There is no rebound and no guarding.  Musculoskeletal: Normal range of motion. He exhibits no edema or tenderness.  Lymphadenopathy:    He has no cervical adenopathy.  Neurological: He is alert and oriented to person, place, and time. He has normal reflexes. No cranial nerve deficit. He exhibits normal muscle tone. He displays a negative Romberg sign. Coordination and gait  normal.  Skin: Skin is warm and dry. No rash noted.  Psychiatric: He has a normal mood and affect. His behavior is normal. Judgment and thought content normal.  R posterior heel is painful  Lab Results  Component Value Date   WBC 10.7* 11/29/2015   HGB 16.7 11/29/2015   HCT 48.8 11/29/2015   PLT 320.0 11/29/2015   GLUCOSE 91 11/29/2015   CHOL 201* 11/29/2015   TRIG 158.0* 11/29/2015   HDL 48.20 11/29/2015   LDLDIRECT 107.9 01/02/2013   LDLCALC 122* 11/29/2015   ALT 23 11/29/2015   AST 16 11/29/2015   NA 138 11/29/2015   K 4.7 11/29/2015   CL 103 11/29/2015   CREATININE 1.09 11/29/2015   BUN 20 11/29/2015   CO2 29 11/29/2015   TSH 0.69 11/29/2015   PSA 2.37 11/29/2015   HGBA1C 5.5 01/02/2013    No results found.  Assessment & Plan:   Victor Montes was seen today for annual exam.  Diagnoses and all orders for this visit:  Male hypogonadism  Other fatigue  Grief  Dyslipidemia  Attention deficit disorder  Other orders -     SYRINGE-NEEDLE, DISP, 3 ML (BD ECLIPSE SYRINGE) 25G X 1" 3 ML MISC; USE AS DIRECTED FOR IM INJECTION   I am having Victor Montes maintain his cholecalciferol, sildenafil, albuterol, sertraline, testosterone cypionate, amphetamine-dextroamphetamine, nabumetone, and SYRINGE-NEEDLE (DISP) 3 ML. We will continue to administer testosterone cypionate.  Meds ordered this encounter  Medications  . SYRINGE-NEEDLE, DISP, 3 ML (BD ECLIPSE SYRINGE) 25G X 1" 3 ML MISC    Sig: USE AS DIRECTED FOR IM INJECTION    Dispense:  50 each    Refill:  11     Follow-up: No Follow-up on file.  Walker Kehr, MD

## 2015-12-01 NOTE — Assessment & Plan Note (Signed)
Monitor labs 

## 2015-12-01 NOTE — Assessment & Plan Note (Signed)
Better  

## 2015-12-01 NOTE — Assessment & Plan Note (Signed)
Discussed Better  

## 2015-12-01 NOTE — Assessment & Plan Note (Signed)
Potential benefits of a long term testosterone use as well as potential risks  and complications were explained to the patient and were aknowledged.  On testosterone IM

## 2015-12-06 ENCOUNTER — Telehealth: Payer: Self-pay | Admitting: *Deleted

## 2015-12-06 NOTE — Telephone Encounter (Signed)
Ok Thx 

## 2015-12-06 NOTE — Telephone Encounter (Signed)
Notified pharmacist Myrene Buddy w/MD response...Victor Montes

## 2015-12-06 NOTE — Telephone Encounter (Signed)
Received call pharmacist states pt thought he had a rx on file for this month for the Adderrall, but he does not. MD gave rx on 7/12, but its dated to fill on 8/5. Wanting to get authorization to fill today. He last got refill on 10/27/15 which he is due. Pls advise....[name]

## 2016-02-07 ENCOUNTER — Telehealth: Payer: Self-pay

## 2016-02-07 NOTE — Telephone Encounter (Signed)
PA initiated via CoverMyMeds key [id number] APPROVED 01/08/2016 - 02/06/2019, pharmacy advised via fax

## 2016-02-21 ENCOUNTER — Telehealth: Payer: Self-pay

## 2016-02-21 NOTE — Telephone Encounter (Signed)
PA initiated via CoverMyMeds key [id number]

## 2016-02-21 NOTE — Telephone Encounter (Signed)
PA APPROVED, pharmacy advised via fax

## 2016-03-22 ENCOUNTER — Telehealth: Payer: Self-pay | Admitting: *Deleted

## 2016-03-22 MED ORDER — AMPHETAMINE-DEXTROAMPHETAMINE 10 MG PO TABS: 10.0000 mg | ORAL_TABLET | Freq: Two times a day (BID) | ORAL | 0 refills | Status: DC

## 2016-03-22 NOTE — Telephone Encounter (Signed)
Printed script, MD signed, called pt no answer LMOM rx ready for p/u...Victor Montes

## 2016-03-22 NOTE — Telephone Encounter (Signed)
Rec'd call pt is requesting refill on Adderrall...Victor Montes

## 2016-03-22 NOTE — Telephone Encounter (Signed)
OK to fill this prescription with additional refills x) OV q 3 mo Thank you!

## 2016-04-02 ENCOUNTER — Other Ambulatory Visit: Payer: Self-pay | Admitting: Internal Medicine

## 2016-04-04 NOTE — Telephone Encounter (Signed)
Needs OV q 3 mo 

## 2016-04-04 NOTE — Telephone Encounter (Signed)
Rx faxed to pharmacy. Will have schedulers call pt to schedule OV with PCP for 3 mo f/u.

## 2016-04-05 ENCOUNTER — Telehealth: Payer: Self-pay | Admitting: *Deleted

## 2016-04-05 NOTE — Telephone Encounter (Signed)
-----   Message from Ian Malkin sent at 04/05/2016  3:50 PM EST ----- Regarding: RE: Needs 3 mo f/u Called left vm  ----- Message ----- From: Cresenciano Lick, CMA Sent: 04/04/2016   9:30 AM To: Ian Malkin Subject: Needs 3 mo f/u                                 Please call pt- Last OV was 11/2015. PCP wanted him back in 3 mo for f/u. MD refilled his testosterone with 0 refills. Thanks!!!

## 2016-06-09 ENCOUNTER — Encounter (HOSPITAL_COMMUNITY): Payer: Self-pay

## 2016-06-09 ENCOUNTER — Emergency Department (HOSPITAL_COMMUNITY): Payer: 59

## 2016-06-09 ENCOUNTER — Emergency Department (HOSPITAL_COMMUNITY)
Admission: EM | Admit: 2016-06-09 | Discharge: 2016-06-09 | Disposition: A | Discharge status: Home / Self Care | Payer: 59 | Attending: Emergency Medicine | Admitting: Emergency Medicine

## 2016-06-09 DIAGNOSIS — J45909 Unspecified asthma, uncomplicated: Secondary | ICD-10-CM | POA: Insufficient documentation

## 2016-06-09 DIAGNOSIS — F17210 Nicotine dependence, cigarettes, uncomplicated: Secondary | ICD-10-CM | POA: Insufficient documentation

## 2016-06-09 DIAGNOSIS — N50812 Left testicular pain: Secondary | ICD-10-CM | POA: Diagnosis present

## 2016-06-09 DIAGNOSIS — I861 Scrotal varices: Secondary | ICD-10-CM | POA: Diagnosis not present

## 2016-06-09 DIAGNOSIS — N451 Epididymitis: Secondary | ICD-10-CM

## 2016-06-09 DIAGNOSIS — F909 Attention-deficit hyperactivity disorder, unspecified type: Secondary | ICD-10-CM | POA: Diagnosis not present

## 2016-06-09 LAB — URINALYSIS, ROUTINE W REFLEX MICROSCOPIC
BILIRUBIN URINE: NEGATIVE
Glucose, UA: NEGATIVE mg/dL
Hgb urine dipstick: NEGATIVE
KETONES UR: 5 mg/dL — AB
Nitrite: NEGATIVE
PH: 6 (ref 5.0–8.0)
Protein, ur: 30 mg/dL — AB
SPECIFIC GRAVITY, URINE: 1.024 (ref 1.005–1.030)
SQUAMOUS EPITHELIAL / LPF: NONE SEEN

## 2016-06-09 MED ORDER — CEFTRIAXONE SODIUM 250 MG IJ SOLR
250.0000 mg | Freq: Once | INTRAMUSCULAR | Status: AC
  Administered 2016-06-09: 250 mg via INTRAMUSCULAR
  Filled 2016-06-09: qty 250

## 2016-06-09 MED ORDER — LEVOFLOXACIN 500 MG PO TABS: 500.0000 mg | ORAL_TABLET | Freq: Every day | ORAL | 0 refills | Status: DC

## 2016-06-09 MED ORDER — IBUPROFEN 200 MG PO TABS
400.0000 mg | ORAL_TABLET | Freq: Once | ORAL | Status: AC
  Administered 2016-06-09: 400 mg via ORAL
  Filled 2016-06-09: qty 2

## 2016-06-09 MED ORDER — LIDOCAINE HCL 1 % IJ SOLN
INTRAMUSCULAR | Status: AC
  Administered 2016-06-09: 20 mL
  Filled 2016-06-09: qty 20

## 2016-06-09 NOTE — Discharge Instructions (Signed)
It was our pleasure to provide your ER care today - we hope that you feel better.  Take antibiotic as prescribed.  Take acetaminophen and/or ibuprofen as need for pain.  Follow up with primary care doctor in the coming week if symptoms fail to improve/resolve.  Return to ER if worse, worsening/severe pain, persistent vomiting, other concern.

## 2016-06-09 NOTE — ED Triage Notes (Signed)
Pt has had tenderness in left testicle x 1 week.  Pt noted swelling in left testicle with a knot.  Pain radiates up groin.  No urinary symptoms or discharge.

## 2016-06-09 NOTE — ED Notes (Signed)
PT aware of urine sample. Urinal in hand  

## 2016-06-09 NOTE — ED Provider Notes (Signed)
[facility] DEPT Provider Note   CSN: [id number] Arrival date & time: 06/09/16  0848     History   Chief Complaint Chief Complaint  Patient presents with  . Testicle Pain    HPI Victor Montes is a 61 y.o. male.  Patient c/o left testicle pain for past week. Pain constant, dull, moderate. No specific exacerbating or alleviating factors. No dysuria or hematuria. Denies injury to area. No abd pain or swelling. Is having normal bms. No fever or chills.    The history is provided by the patient.  Testicle Pain  Pertinent negatives include no abdominal pain.    Past Medical History:  Diagnosis Date  . ADD (attention deficit disorder)   . Allergic rhinitis   . Asthma   . Cervical radiculopathy 2007   Right- Dr. Hal Neer   . Constipation 2011  . Glucose intolerance (impaired glucose tolerance)   . Hyperlipidemia   . Hypogonadism male   . Occipital neuralgia 2011   Left  . OSA (obstructive sleep apnea)     Patient Active Problem List   Diagnosis Date Noted  . Achilles tendinitis of left lower extremity 08/26/2015  . Retrocalcaneal bursitis 08/26/2015  . Heel spur 08/26/2015  . Heel pain 08/25/2015  . Erectile dysfunction 10/28/2014  . Insomnia 06/05/2013  . Grief 06/04/2013  . Urinary urgency 04/07/2013  . Obstructive sleep apnea 04/07/2012  . Well adult exam 09/20/2011  . Paresthesia 09/20/2011  . Fatigue 11/25/2010  . OCCIPITAL NEURALGIA 04/20/2010  . OTITIS MEDIA, LEFT 03/30/2010  . HEADACHE 03/30/2010  . SHOULDER PAIN 10/25/2009  . CHANGE IN BOWELS 10/06/2009  . SEBACEOUS CYST, INFECTED 05/19/2009  . NECK PAIN 05/19/2009  . PARESTHESIA 05/19/2009  . Tobacco abuse 05/19/2009  . 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 WEIGHT LOSS 12/02/2007  . ORAL ULCER 11/21/2007  . NEOPLASM OF UNCERTAIN BEHAVIOR OF SKIN 04/10/2007  . ELBOW PAIN 04/10/2007  . Male hypogonadism 04/08/2007  . GLUCOSE INTOLERANCE 04/08/2007  . Dyslipidemia  04/08/2007  . Attention deficit disorder 04/08/2007  . SINUSITIS- ACUTE-NOS 04/08/2007  . ALLERGIC RHINITIS 04/08/2007  . ASTHMA 04/08/2007    Past Surgical History:  Procedure Laterality Date  . UVULOPALATOPLASTY     s/p       Home Medications    Prior to Admission medications   Medication Sig Start Date End Date Taking? Authorizing Provider  albuterol (PROAIR HFA) 108 (90 Base) MCG/ACT inhaler Inhale 2 puffs into the lungs every 6 (six) hours as needed for wheezing or shortness of breath. 08/25/15   Aleksei Plotnikov V, MD  amphetamine-dextroamphetamine (ADDERALL) 10 MG tablet Take 1 tablet (10 mg total) by mouth 2 (two) times daily. 03/22/16   Aleksei Plotnikov V, MD  cholecalciferol (VITAMIN D) 1000 UNITS tablet Take 1,000 Units by mouth daily.      Historical Provider, MD  nabumetone (RELAFEN) 500 MG tablet Take 1 tablet (500 mg total) by mouth 2 (two) times daily as needed for moderate pain. 08/25/15   Aleksei Plotnikov V, MD  sertraline (ZOLOFT) 50 MG tablet Take 1 tablet (50 mg total) by mouth daily. 08/25/15   Aleksei Plotnikov V, MD  sildenafil (VIAGRA) 100 MG tablet Take 1 tablet (100 mg total) by mouth as needed for erectile dysfunction. 10/28/14   Aleksei Plotnikov V, MD  SYRINGE-NEEDLE, DISP, 3 ML (BD ECLIPSE SYRINGE) 25G X 1" 3 ML MISC USE AS DIRECTED FOR IM INJECTION 12/01/15   Lew Dawes V, MD  testosterone cypionate (DEPOTESTOSTERONE CYPIONATE) 200 MG/ML  injection INJECT TWO ML (CC) INTRAMUSCULARLY EVERY 14 DAYS 04/04/16   Cassandria Anger, MD    Family History Family History  Problem Relation Age of Onset  . Colon cancer Mother   . Cancer Mother 39    colon ca  . Stroke Father   . Heart disease Father     A fib  . Hypertension Other   . Coronary artery disease Neg Hx     Social History Social History  Substance Use Topics  . Smoking status: Current Some Day Smoker    Packs/day: 0.10    Years: 20.00    Types: Cigarettes  . Smokeless tobacco: Never  Used  . Alcohol use Yes     Allergies   Patient has no known allergies.   Review of Systems Review of Systems  Constitutional: Negative for chills and fever.  Gastrointestinal: Negative for abdominal pain, constipation, nausea and vomiting.  Genitourinary: Positive for testicular pain. Negative for dysuria and hematuria.     Physical Exam Updated Vital Signs BP 177/88 (BP Location: Left Arm)   Pulse 81   Temp 97.6 F (36.4 C) (Oral)   Resp 12   Ht 6\' 6"  (1.981 m)   Wt 104.3 kg   SpO2 100%   BMI 26.58 kg/m   Physical Exam  Constitutional: He is oriented to person, place, and time. He appears well-developed and well-nourished. No distress.  HENT:  Mouth/Throat: Oropharynx is clear and moist.  Eyes: Conjunctivae are normal.  Neck: Neck supple. No tracheal deviation present.  Cardiovascular: Normal rate.   Pulmonary/Chest: Effort normal. No accessory muscle usage. No respiratory distress.  Abdominal: He exhibits no distension.  Genitourinary:  Genitourinary Comments: No cva tenderness.  Left testicle tenderness.   Musculoskeletal: He exhibits no edema.  Neurological: He is alert and oriented to person, place, and time.  Skin: Skin is warm and dry. No rash noted.  Psychiatric: He has a normal mood and affect.  Nursing note and vitals reviewed.    ED Treatments / Results  Labs (all labs ordered are listed, but only abnormal results are displayed)  US Scrotum  Result Date: 06/09/2016 CLINICAL DATA:  Left testicular pain for 2 days.  No known injury. EXAM: SCROTAL ULTRASOUND DOPPLER ULTRASOUND OF THE TESTICLES TECHNIQUE: Complete ultrasound examination of the testicles, epididymis, and other scrotal structures was performed. Color and spectral Doppler ultrasound were also utilized to evaluate blood flow to the testicles. COMPARISON:  None. FINDINGS: Right testicle Measurements: 4.6 x 2.0 x 2.4 cm. No mass or microlithiasis visualized. Left testicle Measurements: 4.2 x  1.9 x 2.7 cm. No mass or microlithiasis visualized. Right epididymis:  Normal in size and appearance. Left epididymis: Edematous and hypervascular compared to the right side. Hydrocele:  Small left hydrocele is noted. Varicocele:  Varicocele is seen on the left. Pulsed Doppler interrogation of both testes demonstrates normal low resistance arterial and venous waveforms bilaterally. IMPRESSION: Findings consistent with epididymitis on the left with an associated small left hydrocele. Left varicocele. Electronically Signed   By: Inge Rise M.D.   On: 06/09/2016 11:02   [place] Art/ven Flow Abd Pelv Doppler  Result Date: 06/09/2016 CLINICAL DATA:  Left testicular pain for 2 days.  No known injury. EXAM: SCROTAL ULTRASOUND DOPPLER ULTRASOUND OF THE TESTICLES TECHNIQUE: Complete ultrasound examination of the testicles, epididymis, and other scrotal structures was performed. Color and spectral Doppler ultrasound were also utilized to evaluate blood flow to the testicles. COMPARISON:  None. FINDINGS: Right testicle Measurements: 4.6 x  2.0 x 2.4 cm. No mass or microlithiasis visualized. Left testicle Measurements: 4.2 x 1.9 x 2.7 cm. No mass or microlithiasis visualized. Right epididymis:  Normal in size and appearance. Left epididymis: Edematous and hypervascular compared to the right side. Hydrocele:  Small left hydrocele is noted. Varicocele:  Varicocele is seen on the left. Pulsed Doppler interrogation of both testes demonstrates normal low resistance arterial and venous waveforms bilaterally. IMPRESSION: Findings consistent with epididymitis on the left with an associated small left hydrocele. Left varicocele. Electronically Signed   By: Inge Rise M.D.   On: 06/09/2016 11:02    EKG  EKG Interpretation None       Radiology No results found.  Procedures Procedures (including critical care time)  Medications Ordered in ED Medications - No data to display   Initial Impression / Assessment  and Plan / ED Course  I have reviewed the triage vital signs and the nursing notes.  Pertinent labs & imaging results that were available during my care of the patient were reviewed by me and considered in my medical decision making (see chart for details).    U/s c/w epididymitis.   Rocephin im in ED.  rx levaquin for home.     Final Clinical Impressions(s) / ED Diagnoses   Final diagnoses:  None    New Prescriptions New Prescriptions   No medications on file     Lajean Saver, MD 06/09/16 1127

## 2016-06-09 NOTE — ED Notes (Signed)
Second attempt to collect PT urine. PT unable to give

## 2016-06-10 LAB — URINE CULTURE: Culture: NO GROWTH

## 2016-07-05 ENCOUNTER — Other Ambulatory Visit (INDEPENDENT_AMBULATORY_CARE_PROVIDER_SITE_OTHER): Payer: 59

## 2016-07-05 ENCOUNTER — Ambulatory Visit (INDEPENDENT_AMBULATORY_CARE_PROVIDER_SITE_OTHER): Payer: 59 | Admitting: Internal Medicine

## 2016-07-05 ENCOUNTER — Encounter: Payer: Self-pay | Admitting: Internal Medicine

## 2016-07-05 VITALS — BP 140/86 | HR 63 | Temp 97.9°F | Resp 16 | Ht 78.0 in | Wt 224.8 lb

## 2016-07-05 DIAGNOSIS — Z23 Encounter for immunization: Secondary | ICD-10-CM

## 2016-07-05 DIAGNOSIS — N451 Epididymitis: Secondary | ICD-10-CM

## 2016-07-05 DIAGNOSIS — G4733 Obstructive sleep apnea (adult) (pediatric): Secondary | ICD-10-CM

## 2016-07-05 DIAGNOSIS — R7989 Other specified abnormal findings of blood chemistry: Secondary | ICD-10-CM

## 2016-07-05 DIAGNOSIS — E291 Testicular hypofunction: Secondary | ICD-10-CM

## 2016-07-05 DIAGNOSIS — Z0000 Encounter for general adult medical examination without abnormal findings: Secondary | ICD-10-CM

## 2016-07-05 DIAGNOSIS — E739 Lactose intolerance, unspecified: Secondary | ICD-10-CM

## 2016-07-05 LAB — CBC WITH DIFFERENTIAL/PLATELET
BASOS ABS: 0 10*3/uL (ref 0.0–0.1)
Basophils Relative: 0.6 % (ref 0.0–3.0)
Eosinophils Absolute: 0.1 10*3/uL (ref 0.0–0.7)
Eosinophils Relative: 0.7 % (ref 0.0–5.0)
HCT: 46 % (ref 39.0–52.0)
Hemoglobin: 15.8 g/dL (ref 13.0–17.0)
Lymphocytes Relative: 34.4 % (ref 12.0–46.0)
Lymphs Abs: 2.9 10*3/uL (ref 0.7–4.0)
MCHC: 34.4 g/dL (ref 30.0–36.0)
MCV: 93.6 fl (ref 78.0–100.0)
MONO ABS: 0.9 10*3/uL (ref 0.1–1.0)
Monocytes Relative: 11 % (ref 3.0–12.0)
NEUTROS PCT: 53.3 % (ref 43.0–77.0)
Neutro Abs: 4.5 10*3/uL (ref 1.4–7.7)
Platelets: 268 10*3/uL (ref 150.0–400.0)
RBC: 4.91 Mil/uL (ref 4.22–5.81)
RDW: 13.4 % (ref 11.5–15.5)
WBC: 8.4 10*3/uL (ref 4.0–10.5)

## 2016-07-05 LAB — LIPID PANEL
CHOL/HDL RATIO: 4
CHOLESTEROL: 202 mg/dL — AB (ref 0–200)
HDL: 56.7 mg/dL (ref >39.00)
NonHDL: 145.03
Triglycerides: 228 mg/dL — ABNORMAL HIGH (ref 0.0–149.0)
VLDL: 45.6 mg/dL — AB (ref 0.0–40.0)

## 2016-07-05 LAB — PSA: PSA: 2.99 ng/mL (ref 0.10–4.00)

## 2016-07-05 LAB — TESTOSTERONE: Testosterone: 192.5 ng/dL — ABNORMAL LOW (ref 300.00–890.00)

## 2016-07-05 LAB — URINALYSIS
Bilirubin Urine: NEGATIVE
Hgb urine dipstick: NEGATIVE
Ketones, ur: NEGATIVE
Leukocytes, UA: NEGATIVE
NITRITE: NEGATIVE
PH: 7 (ref 5.0–8.0)
SPECIFIC GRAVITY, URINE: 1.02 (ref 1.000–1.030)
TOTAL PROTEIN, URINE-UPE24: NEGATIVE
Urine Glucose: NEGATIVE
Urobilinogen, UA: 0.2 (ref 0.0–1.0)

## 2016-07-05 LAB — HEPATIC FUNCTION PANEL
ALT: 45 U/L (ref 0–53)
AST: 26 U/L (ref 0–37)
Albumin: 4.5 g/dL (ref 3.5–5.2)
Alkaline Phosphatase: 79 U/L (ref 39–117)
BILIRUBIN TOTAL: 0.5 mg/dL (ref 0.2–1.2)
Bilirubin, Direct: 0.1 mg/dL (ref 0.0–0.3)
Total Protein: 7 g/dL (ref 6.0–8.3)

## 2016-07-05 LAB — TSH: TSH: 0.69 u[IU]/mL (ref 0.35–4.50)

## 2016-07-05 LAB — BASIC METABOLIC PANEL
BUN: 21 mg/dL (ref 6–23)
CALCIUM: 9.8 mg/dL (ref 8.4–10.5)
CO2: 31 mEq/L (ref 19–32)
Chloride: 105 mEq/L (ref 96–112)
Creatinine, Ser: 0.99 mg/dL (ref 0.40–1.50)
GFR: 81.2 mL/min (ref >60.00)
Glucose, Bld: 89 mg/dL (ref 70–99)
Potassium: 5.1 mEq/L (ref 3.5–5.1)
Sodium: 142 mEq/L (ref 135–145)

## 2016-07-05 LAB — LDL CHOLESTEROL, DIRECT: Direct LDL: 118 mg/dL

## 2016-07-05 MED ORDER — AMPHETAMINE-DEXTROAMPHETAMINE 10 MG PO TABS: 10.0000 mg | ORAL_TABLET | Freq: Two times a day (BID) | ORAL | 0 refills | Status: DC

## 2016-07-05 MED ORDER — TESTOSTERONE CYPIONATE 200 MG/ML IM SOLN: INTRAMUSCULAR | 3 refills | Status: DC

## 2016-07-05 MED ORDER — SILDENAFIL CITRATE 100 MG PO TABS: 100.0000 mg | ORAL_TABLET | ORAL | 11 refills | Status: DC | PRN

## 2016-07-05 NOTE — Assessment & Plan Note (Signed)
labs

## 2016-07-05 NOTE — Assessment & Plan Note (Signed)
using an oral device

## 2016-07-05 NOTE — Assessment & Plan Note (Signed)
We discussed age appropriate health related issues, including available/recomended screening tests and vaccinations. We discussed a need for adhering to healthy diet and exercise. Labs/EKG were reviewed/ordered. All questions were answered.   

## 2016-07-05 NOTE — Assessment & Plan Note (Signed)
On Testosterone  Potential benefits of a long term sex steroid  use as well as potential risks  and complications were explained to the patient and were aknowledged. Labs

## 2016-07-05 NOTE — Progress Notes (Signed)
Pre-visit discussion using our clinic review tool. No additional management support is needed unless otherwise documented below in the visit note.  

## 2016-07-05 NOTE — Assessment & Plan Note (Signed)
Resolved

## 2016-07-05 NOTE — Progress Notes (Signed)
Subjective:  Patient ID: Victor Montes, male    DOB: 09-19-1955  Age: 61 y.o. MRN: [id number]  CC: Hospitalization Follow-up (Epididymitis, yellow color seimen-Stable on 07/05/2016)   HPI [name] presents for a well exam C/o L testis swelling x 1 mo - better F/u ADD, low testosterone  Outpatient Medications Prior to Visit  Medication Sig Dispense Refill  . albuterol (PROAIR HFA) 108 (90 Base) MCG/ACT inhaler Inhale 2 puffs into the lungs every 6 (six) hours as needed for wheezing or shortness of breath. 3 Inhaler 3  . aspirin EC 81 MG tablet Take 81 mg by mouth daily.    Marland Kitchen b complex vitamins tablet Take 1 tablet by mouth daily.    . cetirizine (ZYRTEC) 10 MG tablet Take 10 mg by mouth daily.    . cholecalciferol (VITAMIN D) 1000 UNITS tablet Take 1,000 Units by mouth daily.      . Coenzyme Q10 (CO Q 10 PO) Take 1 tablet by mouth daily.    . Ginkgo Biloba 40 MG TABS Take 1 tablet by mouth daily.    Marland Kitchen guaiFENesin (MUCINEX) 600 MG 12 hr tablet Take 600 mg by mouth daily with breakfast.    . MAGNESIUM PO Take 1 tablet by mouth daily.    . Multiple Vitamin (MULTIVITAMIN WITH MINERALS) TABS tablet Take 1 tablet by mouth daily.    . naproxen sodium (ANAPROX) 220 MG tablet Take 220 mg by mouth daily.    . Probiotic Product (PROBIOTIC DAILY) CAPS Take 1 capsule by mouth daily.    . sertraline (ZOLOFT) 50 MG tablet Take 1 tablet (50 mg total) by mouth daily. 90 tablet 3  . SYRINGE-NEEDLE, DISP, 3 ML (BD ECLIPSE SYRINGE) 25G X 1" 3 ML MISC USE AS DIRECTED FOR IM INJECTION 50 each 11  . testosterone cypionate (DEPOTESTOSTERONE CYPIONATE) 200 MG/ML injection INJECT TWO ML (CC) INTRAMUSCULARLY EVERY 14 DAYS (Patient taking differently: INJECT TWO ML (CC) INTRAMUSCULARLY EVERY 3 weeks) 10 mL 0  . amphetamine-dextroamphetamine (ADDERALL) 10 MG tablet Take 1 tablet (10 mg total) by mouth 2 (two) times daily. 60 tablet 0  . levofloxacin (LEVAQUIN) 500 MG tablet Take 1 tablet (500 mg total) by mouth  daily. 10 tablet 0  . sildenafil (VIAGRA) 100 MG tablet Take 1 tablet (100 mg total) by mouth as needed for erectile dysfunction. 30 tablet 11   Facility-Administered Medications Prior to Visit  Medication Dose Route Frequency Provider Last Rate Last Dose  . testosterone cypionate (DEPOTESTOTERONE CYPIONATE) injection 200 mg  200 mg Intramuscular Q14 Days Aleksei Plotnikov V, MD   200 mg at 01/18/12 0933    ROS Review of Systems  Constitutional: Negative for appetite change, fatigue and unexpected weight change.  HENT: Negative for congestion, nosebleeds, sneezing, sore throat and trouble swallowing.   Eyes: Negative for itching and visual disturbance.  Respiratory: Negative for cough.   Cardiovascular: Negative for chest pain, palpitations and leg swelling.  Gastrointestinal: Negative for abdominal distention, blood in stool, diarrhea and nausea.  Genitourinary: Positive for scrotal swelling. Negative for decreased urine volume, frequency, hematuria and testicular pain.  Musculoskeletal: Negative for back pain, gait problem, joint swelling and neck pain.  Skin: Negative for rash.  Neurological: Negative for dizziness, tremors, speech difficulty and weakness.  Psychiatric/Behavioral: Negative for agitation, dysphoric mood, sleep disturbance and suicidal ideas. The patient is not nervous/anxious.     Objective:  BP 140/86   Pulse 63   Temp 97.9 F (36.6 C) (Oral)   Resp 16  Ht 6\' 6"  (1.981 m)   Wt 224 lb 12 oz (101.9 kg)   SpO2 98%   BMI 25.97 kg/m   BP Readings from Last 3 Encounters:  07/05/16 140/86  06/09/16 146/82  12/01/15 (!) 144/78    Wt Readings from Last 3 Encounters:  07/05/16 224 lb 12 oz (101.9 kg)  06/09/16 230 lb (104.3 kg)  12/01/15 234 lb (106.1 kg)    Physical Exam  Constitutional: He is oriented to person, place, and time. He appears well-developed. No distress.  NAD  HENT:  Mouth/Throat: Oropharynx is clear and moist.  Eyes: Conjunctivae are  normal. Pupils are equal, round, and reactive to light.  Neck: Normal range of motion. No JVD present. No thyromegaly present.  Cardiovascular: Normal rate, regular rhythm, normal heart sounds and intact distal pulses.  Exam reveals no gallop and no friction rub.   No murmur heard. Pulmonary/Chest: Effort normal and breath sounds normal. No respiratory distress. He has no wheezes. He has no rales. He exhibits no tenderness.  Abdominal: Soft. Bowel sounds are normal. He exhibits no distension and no mass. There is no tenderness. There is no rebound and no guarding.  Genitourinary: No penile tenderness.  Musculoskeletal: Normal range of motion. He exhibits no edema or tenderness.  Lymphadenopathy:    He has no cervical adenopathy.  Neurological: He is alert and oriented to person, place, and time. He has normal reflexes. No cranial nerve deficit. He exhibits normal muscle tone. He displays a negative Romberg sign. Coordination and gait normal.  Skin: Skin is warm and dry. No rash noted.  Psychiatric: He has a normal mood and affect. His behavior is normal. Judgment and thought content normal.  L testis a little swollen on top, NT Rectal - per ER in Jan   Lab Results  Component Value Date   WBC 10.7 (H) 11/29/2015   HGB 16.7 11/29/2015   HCT 48.8 11/29/2015   PLT 320.0 11/29/2015   GLUCOSE 91 11/29/2015   CHOL 201 (H) 11/29/2015   TRIG 158.0 (H) 11/29/2015   HDL 48.20 11/29/2015   LDLDIRECT 107.9 01/02/2013   LDLCALC 122 (H) 11/29/2015   ALT 23 11/29/2015   AST 16 11/29/2015   NA 138 11/29/2015   K 4.7 11/29/2015   CL 103 11/29/2015   CREATININE 1.09 11/29/2015   BUN 20 11/29/2015   CO2 29 11/29/2015   TSH 0.69 11/29/2015   PSA 2.37 11/29/2015   HGBA1C 5.5 01/02/2013    US Scrotum  Result Date: 06/09/2016 CLINICAL DATA:  Left testicular pain for 2 days.  No known injury. EXAM: SCROTAL ULTRASOUND DOPPLER ULTRASOUND OF THE TESTICLES TECHNIQUE: Complete ultrasound examination of  the testicles, epididymis, and other scrotal structures was performed. Color and spectral Doppler ultrasound were also utilized to evaluate blood flow to the testicles. COMPARISON:  None. FINDINGS: Right testicle Measurements: 4.6 x 2.0 x 2.4 cm. No mass or microlithiasis visualized. Left testicle Measurements: 4.2 x 1.9 x 2.7 cm. No mass or microlithiasis visualized. Right epididymis:  Normal in size and appearance. Left epididymis: Edematous and hypervascular compared to the right side. Hydrocele:  Small left hydrocele is noted. Varicocele:  Varicocele is seen on the left. Pulsed Doppler interrogation of both testes demonstrates normal low resistance arterial and venous waveforms bilaterally. IMPRESSION: Findings consistent with epididymitis on the left with an associated small left hydrocele. Left varicocele. Electronically Signed   By: Inge Rise M.D.   On: 06/09/2016 11:02   [place] Art/ven Flow Abd Pelv  Doppler  Result Date: 06/09/2016 CLINICAL DATA:  Left testicular pain for 2 days.  No known injury. EXAM: SCROTAL ULTRASOUND DOPPLER ULTRASOUND OF THE TESTICLES TECHNIQUE: Complete ultrasound examination of the testicles, epididymis, and other scrotal structures was performed. Color and spectral Doppler ultrasound were also utilized to evaluate blood flow to the testicles. COMPARISON:  None. FINDINGS: Right testicle Measurements: 4.6 x 2.0 x 2.4 cm. No mass or microlithiasis visualized. Left testicle Measurements: 4.2 x 1.9 x 2.7 cm. No mass or microlithiasis visualized. Right epididymis:  Normal in size and appearance. Left epididymis: Edematous and hypervascular compared to the right side. Hydrocele:  Small left hydrocele is noted. Varicocele:  Varicocele is seen on the left. Pulsed Doppler interrogation of both testes demonstrates normal low resistance arterial and venous waveforms bilaterally. IMPRESSION: Findings consistent with epididymitis on the left with an associated small left hydrocele. Left  varicocele. Electronically Signed   By: Inge Rise M.D.   On: 06/09/2016 11:02    Assessment & Plan:   Dali was seen today for hospitalization follow-up.  Diagnoses and all orders for this visit:  Well adult exam -     Basic metabolic panel; Future -     CBC with Differential/Platelet; Future -     Hepatic function panel; Future -     Lipid panel; Future -     PSA; Future -     TSH; Future -     Urinalysis; Future -     Testosterone; Future  Encounter for immunization -     Flu Vaccine QUAD 36+ mos IM  Epididymitis  Male hypogonadism -     Testosterone; Future  Obstructive sleep apnea  GLUCOSE INTOLERANCE  Other orders -     sildenafil (VIAGRA) 100 MG tablet; Take 1 tablet (100 mg total) by mouth as needed for erectile dysfunction. -     Discontinue: amphetamine-dextroamphetamine (ADDERALL) 10 MG tablet; Take 1 tablet (10 mg total) by mouth 2 (two) times daily. -     Discontinue: amphetamine-dextroamphetamine (ADDERALL) 10 MG tablet; Take 1 tablet (10 mg total) by mouth 2 (two) times daily. -     amphetamine-dextroamphetamine (ADDERALL) 10 MG tablet; Take 1 tablet (10 mg total) by mouth 2 (two) times daily.   I have discontinued Mr. Head levofloxacin. I am also having him maintain his cholecalciferol, albuterol, sertraline, SYRINGE-NEEDLE (DISP) 3 ML, testosterone cypionate, multivitamin with minerals, b complex vitamins, MAGNESIUM PO, Coenzyme Q10 (CO Q 10 PO), aspirin EC, cetirizine, Ginkgo Biloba, PROBIOTIC DAILY, naproxen sodium, guaiFENesin, sildenafil, and amphetamine-dextroamphetamine. We will continue to administer testosterone cypionate.  Meds ordered this encounter  Medications  . sildenafil (VIAGRA) 100 MG tablet    Sig: Take 1 tablet (100 mg total) by mouth as needed for erectile dysfunction.    Dispense:  30 tablet    Refill:  11  . DISCONTD: amphetamine-dextroamphetamine (ADDERALL) 10 MG tablet    Sig: Take 1 tablet (10 mg total) by mouth 2  (two) times daily.    Dispense:  60 tablet    Refill:  0    Please fill on or after 07/05/16  . DISCONTD: amphetamine-dextroamphetamine (ADDERALL) 10 MG tablet    Sig: Take 1 tablet (10 mg total) by mouth 2 (two) times daily.    Dispense:  60 tablet    Refill:  0    Please fill on or after 08/02/16  . amphetamine-dextroamphetamine (ADDERALL) 10 MG tablet    Sig: Take 1 tablet (10 mg total) by mouth 2 (two)  times daily.    Dispense:  60 tablet    Refill:  0    Please fill on or after 09/02/16     Follow-up: No Follow-up on file.  Walker Kehr, MD

## 2016-08-09 ENCOUNTER — Other Ambulatory Visit: Payer: Self-pay | Admitting: Internal Medicine

## 2016-10-27 ENCOUNTER — Encounter: Payer: Self-pay | Admitting: Family Medicine

## 2016-10-27 ENCOUNTER — Ambulatory Visit (INDEPENDENT_AMBULATORY_CARE_PROVIDER_SITE_OTHER): Payer: 59 | Admitting: Family Medicine

## 2016-10-27 VITALS — BP 150/85 | HR 67 | Temp 98.6°F | Ht 78.0 in | Wt 224.0 lb

## 2016-10-27 DIAGNOSIS — G500 Trigeminal neuralgia: Secondary | ICD-10-CM

## 2016-10-27 MED ORDER — PREDNISONE 10 MG PO TABS: ORAL_TABLET | ORAL | 0 refills | Status: DC

## 2016-10-27 NOTE — Progress Notes (Signed)
   Subjective:    Patient ID: Victor Montes, male    DOB: [birth year], 61 y.o.   MRN: [id number]  HPI Here for 5 days of intermittent pain in the right ear. These started suddenly and he describes them as sharp and severe. They only last a few seconds and then go away. He also describes an extra sensitivity on the right temple. No neck pain or headache. No visible rash. No change in hearing or dizziness. He does describe some blurred vision that lasted all day 3 days ago, but this resolved and his vision has been fine since. He cannot be sure if the blurred vision involved one or both eyes. No sinus congestion.    Review of Systems  Constitutional: Negative.   HENT: Positive for ear pain. Negative for congestion, dental problem, ear discharge, facial swelling, hearing loss, postnasal drip, sinus pain, sinus pressure and sore throat.   Eyes: Positive for visual disturbance. Negative for photophobia, pain, discharge, redness and itching.  Respiratory: Negative.   Cardiovascular: Negative.   Skin: Negative for rash.  Neurological: Negative.        Objective:   Physical Exam  Constitutional: He is oriented to person, place, and time. He appears well-developed and well-nourished. No distress.  HENT:  Head: Normocephalic and atraumatic.  Right Ear: External ear normal.  Left Ear: External ear normal.  Nose: Nose normal.  Mouth/Throat: Oropharynx is clear and moist.  There is no visible rash on the scalp or face. The temporal artery on the right feels normal, it is not swollen or tender . No TMJ tenderness. He does admit to extra sensitivity on the right temple to light touch   Eyes: Conjunctivae and EOM are normal. Pupils are equal, round, and reactive to light.  No photophobia   Neck: Neck supple. No thyromegaly present.  Pulmonary/Chest: Effort normal and breath sounds normal. No respiratory distress. He has no wheezes. He has no rales.  Lymphadenopathy:    He has no cervical adenopathy.    Neurological: He is alert and oriented to person, place, and time. No cranial nerve deficit.          Assessment & Plan:  Right ear pain, most consistent with trigeminal neuralgia. I also consider temporal arteritis as a possible etiology, though this is less likely. We will start him on a Prednisone taper, beginning with 40 mg a day. Recheck early next week either with Dr. Alain Marion or myself.  Alysia Penna, MD

## 2016-10-27 NOTE — Patient Instructions (Signed)
WE NOW OFFER   [facility] FAST TRACK!!!  SAME DAY Appointments for ACUTE CARE  Such as: Sprains, Injuries, cuts, abrasions, rashes, muscle pain, joint pain, back pain Colds, flu, sore throats, headache, allergies, cough, fever  Ear pain, sinus and eye infections Abdominal pain, nausea, vomiting, diarrhea, upset stomach Animal/insect bites  3 Easy Ways to Schedule: Walk-In Scheduling Call in scheduling Mychart Sign-up: [email or web address]         

## 2016-10-30 ENCOUNTER — Telehealth: Payer: Self-pay | Admitting: *Deleted

## 2016-10-30 MED ORDER — AMPHETAMINE-DEXTROAMPHETAMINE 10 MG PO TABS: 10.0000 mg | ORAL_TABLET | Freq: Two times a day (BID) | ORAL | 0 refills | Status: DC

## 2016-10-30 NOTE — Telephone Encounter (Signed)
Called pt no answer LMOm rx ready for pick-up...Victor Montes

## 2016-10-30 NOTE — Telephone Encounter (Signed)
OK to fill this/these prescription(s) with additional refills x0 Needs to have an OV every 3 months Thank you!  

## 2016-10-30 NOTE — Telephone Encounter (Signed)
Pt left msg on triage Friday afternoon requesting refill on his Adderrall....Victor Montes

## 2016-12-20 ENCOUNTER — Telehealth: Payer: Self-pay | Admitting: Internal Medicine

## 2016-12-20 NOTE — Telephone Encounter (Signed)
Pt called requesting a refill on his amphetamine-dextroamphetamine (ADDERALL) 10 MG tablet. He made an appointment with Dr Alain Marion on 08/20. This was the first available that he was able to come in to see Dr Alain Marion.

## 2016-12-20 NOTE — Telephone Encounter (Signed)
OK to fill this/these prescription(s) with additional refills x0 Thank you!  

## 2016-12-20 NOTE — Telephone Encounter (Signed)
Check [place] registry last filled 11/08/2016 @ [facility] pls advise...Victor Montes

## 2016-12-21 MED ORDER — AMPHETAMINE-DEXTROAMPHETAMINE 10 MG PO TABS: 10.0000 mg | ORAL_TABLET | Freq: Two times a day (BID) | ORAL | 0 refills | Status: DC

## 2016-12-21 NOTE — Telephone Encounter (Signed)
Notified pt rx ready for pick-up.../lmb 

## 2017-01-08 ENCOUNTER — Encounter: Payer: Self-pay | Admitting: Internal Medicine

## 2017-01-08 ENCOUNTER — Other Ambulatory Visit (INDEPENDENT_AMBULATORY_CARE_PROVIDER_SITE_OTHER): Payer: 59

## 2017-01-08 ENCOUNTER — Ambulatory Visit (INDEPENDENT_AMBULATORY_CARE_PROVIDER_SITE_OTHER): Payer: 59 | Admitting: Internal Medicine

## 2017-01-08 DIAGNOSIS — G4733 Obstructive sleep apnea (adult) (pediatric): Secondary | ICD-10-CM

## 2017-01-08 DIAGNOSIS — E291 Testicular hypofunction: Secondary | ICD-10-CM

## 2017-01-08 DIAGNOSIS — F988 Other specified behavioral and emotional disorders with onset usually occurring in childhood and adolescence: Secondary | ICD-10-CM

## 2017-01-08 DIAGNOSIS — E739 Lactose intolerance, unspecified: Secondary | ICD-10-CM

## 2017-01-08 LAB — CBC WITH DIFFERENTIAL/PLATELET
BASOS ABS: 0 10*3/uL (ref 0.0–0.1)
BASOS PCT: 0.5 % (ref 0.0–3.0)
EOS ABS: 0 10*3/uL (ref 0.0–0.7)
Eosinophils Relative: 0.5 % (ref 0.0–5.0)
HCT: 51 % (ref 39.0–52.0)
HEMOGLOBIN: 17.3 g/dL — AB (ref 13.0–17.0)
LYMPHS PCT: 17 % (ref 12.0–46.0)
Lymphs Abs: 1.7 10*3/uL (ref 0.7–4.0)
MCHC: 33.8 g/dL (ref 30.0–36.0)
MCV: 98.3 fl (ref 78.0–100.0)
Monocytes Absolute: 0.7 10*3/uL (ref 0.1–1.0)
Monocytes Relative: 7.4 % (ref 3.0–12.0)
Neutro Abs: 7.5 10*3/uL (ref 1.4–7.7)
Neutrophils Relative %: 74.6 % (ref 43.0–77.0)
Platelets: 332 10*3/uL (ref 150.0–400.0)
RBC: 5.19 Mil/uL (ref 4.22–5.81)
RDW: 13.9 % (ref 11.5–15.5)
WBC: 10.1 10*3/uL (ref 4.0–10.5)

## 2017-01-08 LAB — HEPATIC FUNCTION PANEL
ALK PHOS: 59 U/L (ref 39–117)
ALT: 18 U/L (ref 0–53)
AST: 19 U/L (ref 0–37)
Albumin: 4.4 g/dL (ref 3.5–5.2)
BILIRUBIN DIRECT: 0.2 mg/dL (ref 0.0–0.3)
Total Bilirubin: 1 mg/dL (ref 0.2–1.2)
Total Protein: 7.2 g/dL (ref 6.0–8.3)

## 2017-01-08 LAB — BASIC METABOLIC PANEL
BUN: 12 mg/dL (ref 6–23)
CHLORIDE: 104 meq/L (ref 96–112)
CO2: 29 mEq/L (ref 19–32)
Calcium: 9.8 mg/dL (ref 8.4–10.5)
Creatinine, Ser: 0.97 mg/dL (ref 0.40–1.50)
GFR: 83 mL/min (ref >60.00)
Glucose, Bld: 105 mg/dL — ABNORMAL HIGH (ref 70–99)
Potassium: 5 mEq/L (ref 3.5–5.1)
Sodium: 140 mEq/L (ref 135–145)

## 2017-01-08 LAB — PSA: PSA: 2.32 ng/mL (ref 0.10–4.00)

## 2017-01-08 LAB — TESTOSTERONE: Testosterone: 1002.89 ng/dL — ABNORMAL HIGH (ref 300.00–890.00)

## 2017-01-08 MED ORDER — AMPHETAMINE-DEXTROAMPHETAMINE 10 MG PO TABS: 10.0000 mg | ORAL_TABLET | Freq: Two times a day (BID) | ORAL | 0 refills | Status: DC

## 2017-01-08 MED ORDER — TESTOSTERONE CYPIONATE 200 MG/ML IM SOLN: INTRAMUSCULAR | 3 refills | Status: DC

## 2017-01-08 NOTE — Assessment & Plan Note (Signed)
Adderall Potential benefits of a long term stimulants use as well as potential risks  and complications were explained to the patient and were aknowledged. 

## 2017-01-08 NOTE — Assessment & Plan Note (Signed)
On Testosterone 

## 2017-01-08 NOTE — Assessment & Plan Note (Signed)
Not using COAP On Zyppy device

## 2017-01-08 NOTE — Assessment & Plan Note (Signed)
Labs

## 2017-01-08 NOTE — Progress Notes (Signed)
Subjective:  Patient ID: Victor Montes, male    DOB: December 31, 1955  Age: 61 y.o. MRN: [id number]  CC: No chief complaint on file.   HPI Victor Montes presents for ADD, allergies, depression f/u  Outpatient Medications Prior to Visit  Medication Sig Dispense Refill  . albuterol (PROAIR HFA) 108 (90 Base) MCG/ACT inhaler Inhale 2 puffs into the lungs every 6 (six) hours as needed for wheezing or shortness of breath. 3 Inhaler 3  . amphetamine-dextroamphetamine (ADDERALL) 10 MG tablet Take 1 tablet (10 mg total) by mouth 2 (two) times daily. 60 tablet 0  . aspirin EC 81 MG tablet Take 81 mg by mouth daily.    Marland Kitchen b complex vitamins tablet Take 1 tablet by mouth daily.    . cetirizine (ZYRTEC) 10 MG tablet Take 10 mg by mouth daily.    . cholecalciferol (VITAMIN D) 1000 UNITS tablet Take 1,000 Units by mouth daily.      . Coenzyme Q10 (CO Q 10 PO) Take 1 tablet by mouth daily.    . Ginkgo Biloba 40 MG TABS Take 1 tablet by mouth daily.    Marland Kitchen guaiFENesin (MUCINEX) 600 MG 12 hr tablet Take 600 mg by mouth daily with breakfast.    . MAGNESIUM PO Take 1 tablet by mouth daily.    . Multiple Vitamin (MULTIVITAMIN WITH MINERALS) TABS tablet Take 1 tablet by mouth daily.    . naproxen sodium (ANAPROX) 220 MG tablet Take 220 mg by mouth daily.    . predniSONE (DELTASONE) 10 MG tablet Take 4 tabs a day for 3 days, then 3 a day for 3 days, then 2 a day for 3 days, then 1 a day for 3 days, then stop 50 tablet 0  . Probiotic Product (PROBIOTIC DAILY) CAPS Take 1 capsule by mouth daily.    . sertraline (ZOLOFT) 50 MG tablet TAKE 1 TABLET DAILY 90 tablet 2  . sildenafil (VIAGRA) 100 MG tablet Take 1 tablet (100 mg total) by mouth as needed for erectile dysfunction. 30 tablet 11  . SYRINGE-NEEDLE, DISP, 3 ML (BD ECLIPSE SYRINGE) 25G X 1" 3 ML MISC USE AS DIRECTED FOR IM INJECTION 50 each 11  . testosterone cypionate (DEPOTESTOSTERONE CYPIONATE) 200 MG/ML injection INJECT TWO ML (CC) INTRAMUSCULARLY EVERY 14 DAYS  10 mL 3   Facility-Administered Medications Prior to Visit  Medication Dose Route Frequency Provider Last Rate Last Dose  . testosterone cypionate (DEPOTESTOTERONE CYPIONATE) injection 200 mg  200 mg Intramuscular Q14 Days Ashlay Altieri, Evie Lacks, MD   200 mg at 01/18/12 0933    ROS Review of Systems  Constitutional: Negative for appetite change, fatigue and unexpected weight change.  HENT: Negative for congestion, nosebleeds, sneezing, sore throat and trouble swallowing.   Eyes: Negative for itching and visual disturbance.  Respiratory: Negative for cough.   Cardiovascular: Negative for chest pain, palpitations and leg swelling.  Gastrointestinal: Negative for abdominal distention, blood in stool, diarrhea and nausea.  Genitourinary: Negative for frequency and hematuria.  Musculoskeletal: Negative for back pain, gait problem, joint swelling and neck pain.  Skin: Negative for rash.  Neurological: Negative for dizziness, tremors, speech difficulty and weakness.  Psychiatric/Behavioral: Negative for agitation, dysphoric mood, sleep disturbance and suicidal ideas. The patient is not nervous/anxious.     Objective:  BP (!) 152/86 (BP Location: Left Arm, Patient Position: Sitting, Cuff Size: Large)   Pulse 82   Temp 98.9 F (37.2 C) (Oral)   Ht 6\' 6"  (1.981 m)   Wt 230  lb (104.3 kg)   SpO2 98%   BMI 26.58 kg/m   BP Readings from Last 3 Encounters:  01/08/17 (!) 152/86  10/27/16 (!) 150/85  07/05/16 140/86    Wt Readings from Last 3 Encounters:  01/08/17 230 lb (104.3 kg)  10/27/16 224 lb (101.6 kg)  07/05/16 224 lb 12 oz (101.9 kg)    Physical Exam  Constitutional: He is oriented to person, place, and time. He appears well-developed. No distress.  NAD  HENT:  Mouth/Throat: Oropharynx is clear and moist.  Eyes: Pupils are equal, round, and reactive to light. Conjunctivae are normal.  Neck: Normal range of motion. No JVD present. No thyromegaly present.  Cardiovascular:  Normal rate, regular rhythm, normal heart sounds and intact distal pulses.  Exam reveals no gallop and no friction rub.   No murmur heard. Pulmonary/Chest: Effort normal and breath sounds normal. No respiratory distress. He has no wheezes. He has no rales. He exhibits no tenderness.  Abdominal: Soft. Bowel sounds are normal. He exhibits no distension and no mass. There is no tenderness. There is no rebound and no guarding.  Musculoskeletal: Normal range of motion. He exhibits no edema or tenderness.  Lymphadenopathy:    He has no cervical adenopathy.  Neurological: He is alert and oriented to person, place, and time. He has normal reflexes. No cranial nerve deficit. He exhibits normal muscle tone. He displays a negative Romberg sign. Coordination and gait normal.  Skin: Skin is warm and dry. No rash noted.  Psychiatric: He has a normal mood and affect. His behavior is normal. Judgment and thought content normal.    Lab Results  Component Value Date   WBC 8.4 07/05/2016   HGB 15.8 07/05/2016   HCT 46.0 07/05/2016   PLT 268.0 07/05/2016   GLUCOSE 89 07/05/2016   CHOL 202 (H) 07/05/2016   TRIG 228.0 (H) 07/05/2016   HDL 56.70 07/05/2016   LDLDIRECT 118.0 07/05/2016   LDLCALC 122 (H) 11/29/2015   ALT 45 07/05/2016   AST 26 07/05/2016   NA 142 07/05/2016   K 5.1 07/05/2016   CL 105 07/05/2016   CREATININE 0.99 07/05/2016   BUN 21 07/05/2016   CO2 31 07/05/2016   TSH 0.69 07/05/2016   PSA 2.99 07/05/2016   HGBA1C 5.5 01/02/2013    US Scrotum  Result Date: 06/09/2016 CLINICAL DATA:  Left testicular pain for 2 days.  No known injury. EXAM: SCROTAL ULTRASOUND DOPPLER ULTRASOUND OF THE TESTICLES TECHNIQUE: Complete ultrasound examination of the testicles, epididymis, and other scrotal structures was performed. Color and spectral Doppler ultrasound were also utilized to evaluate blood flow to the testicles. COMPARISON:  None. FINDINGS: Right testicle Measurements: 4.6 x 2.0 x 2.4 cm. No  mass or microlithiasis visualized. Left testicle Measurements: 4.2 x 1.9 x 2.7 cm. No mass or microlithiasis visualized. Right epididymis:  Normal in size and appearance. Left epididymis: Edematous and hypervascular compared to the right side. Hydrocele:  Small left hydrocele is noted. Varicocele:  Varicocele is seen on the left. Pulsed Doppler interrogation of both testes demonstrates normal low resistance arterial and venous waveforms bilaterally. IMPRESSION: Findings consistent with epididymitis on the left with an associated small left hydrocele. Left varicocele. Electronically Signed   By: Inge Rise M.D.   On: 06/09/2016 11:02   [place] Art/ven Flow Abd Pelv Doppler  Result Date: 06/09/2016 CLINICAL DATA:  Left testicular pain for 2 days.  No known injury. EXAM: SCROTAL ULTRASOUND DOPPLER ULTRASOUND OF THE TESTICLES TECHNIQUE: Complete ultrasound examination  of the testicles, epididymis, and other scrotal structures was performed. Color and spectral Doppler ultrasound were also utilized to evaluate blood flow to the testicles. COMPARISON:  None. FINDINGS: Right testicle Measurements: 4.6 x 2.0 x 2.4 cm. No mass or microlithiasis visualized. Left testicle Measurements: 4.2 x 1.9 x 2.7 cm. No mass or microlithiasis visualized. Right epididymis:  Normal in size and appearance. Left epididymis: Edematous and hypervascular compared to the right side. Hydrocele:  Small left hydrocele is noted. Varicocele:  Varicocele is seen on the left. Pulsed Doppler interrogation of both testes demonstrates normal low resistance arterial and venous waveforms bilaterally. IMPRESSION: Findings consistent with epididymitis on the left with an associated small left hydrocele. Left varicocele. Electronically Signed   By: Inge Rise M.D.   On: 06/09/2016 11:02    Assessment & Plan:   There are no diagnoses linked to this encounter. I am having Victor Montes maintain his cholecalciferol, albuterol, SYRINGE-NEEDLE (DISP)  3 ML, multivitamin with minerals, b complex vitamins, MAGNESIUM PO, Coenzyme Q10 (CO Q 10 PO), aspirin EC, cetirizine, Ginkgo Biloba, PROBIOTIC DAILY, naproxen sodium, guaiFENesin, sildenafil, testosterone cypionate, sertraline, predniSONE, and amphetamine-dextroamphetamine. We will continue to administer testosterone cypionate.  No orders of the defined types were placed in this encounter.    Follow-up: No Follow-up on file.  Walker Kehr, MD

## 2017-04-10 ENCOUNTER — Encounter: Payer: Self-pay | Admitting: Internal Medicine

## 2017-04-10 ENCOUNTER — Ambulatory Visit: Payer: 59 | Admitting: Internal Medicine

## 2017-04-10 DIAGNOSIS — F988 Other specified behavioral and emotional disorders with onset usually occurring in childhood and adolescence: Secondary | ICD-10-CM

## 2017-04-10 DIAGNOSIS — E739 Lactose intolerance, unspecified: Secondary | ICD-10-CM

## 2017-04-10 DIAGNOSIS — E291 Testicular hypofunction: Secondary | ICD-10-CM

## 2017-04-10 MED ORDER — AMPHETAMINE-DEXTROAMPHETAMINE 10 MG PO TABS: 10.0000 mg | ORAL_TABLET | Freq: Two times a day (BID) | ORAL | 0 refills | Status: DC

## 2017-04-10 NOTE — Assessment & Plan Note (Signed)
Adderall Potential benefits of a long term stimulants use as well as potential risks  and complications were explained to the patient and were aknowledged. 

## 2017-04-10 NOTE — Progress Notes (Signed)
Subjective:  Patient ID: Victor Montes, male    DOB: 08-Jan-1956  Age: 61 y.o. MRN: [id number]  CC: No chief complaint on file.   HPI [name] presents for ADD, hypogonadism,  Depression f/u  Outpatient Medications Prior to Visit  Medication Sig Dispense Refill  . albuterol (PROAIR HFA) 108 (90 Base) MCG/ACT inhaler Inhale 2 puffs into the lungs every 6 (six) hours as needed for wheezing or shortness of breath. 3 Inhaler 3  . amphetamine-dextroamphetamine (ADDERALL) 10 MG tablet Take 1 tablet (10 mg total) by mouth 2 (two) times daily. Please fill on or after 03/17/17 60 tablet 0  . amphetamine-dextroamphetamine (ADDERALL) 10 MG tablet Take 1 tablet (10 mg total) by mouth 2 (two) times daily. Please fill on or after 02/15/17 60 tablet 0  . amphetamine-dextroamphetamine (ADDERALL) 10 MG tablet Take 1 tablet (10 mg total) by mouth 2 (two) times daily. Please fill on or after 02/15/17 60 tablet 0  . aspirin EC 81 MG tablet Take 81 mg by mouth daily.    Marland Kitchen b complex vitamins tablet Take 1 tablet by mouth daily.    . cetirizine (ZYRTEC) 10 MG tablet Take 10 mg by mouth daily.    . cholecalciferol (VITAMIN D) 1000 UNITS tablet Take 1,000 Units by mouth daily.      . Coenzyme Q10 (CO Q 10 PO) Take 1 tablet by mouth daily.    . Ginkgo Biloba 40 MG TABS Take 1 tablet by mouth daily.    Marland Kitchen guaiFENesin (MUCINEX) 600 MG 12 hr tablet Take 600 mg by mouth daily with breakfast.    . MAGNESIUM PO Take 1 tablet by mouth daily.    . Multiple Vitamin (MULTIVITAMIN WITH MINERALS) TABS tablet Take 1 tablet by mouth daily.    . naproxen sodium (ANAPROX) 220 MG tablet Take 220 mg by mouth daily.    . predniSONE (DELTASONE) 10 MG tablet Take 4 tabs a day for 3 days, then 3 a day for 3 days, then 2 a day for 3 days, then 1 a day for 3 days, then stop 50 tablet 0  . Probiotic Product (PROBIOTIC DAILY) CAPS Take 1 capsule by mouth daily.    . sertraline (ZOLOFT) 50 MG tablet TAKE 1 TABLET DAILY 90 tablet 2  .  sildenafil (VIAGRA) 100 MG tablet Take 1 tablet (100 mg total) by mouth as needed for erectile dysfunction. 30 tablet 11  . SYRINGE-NEEDLE, DISP, 3 ML (BD ECLIPSE SYRINGE) 25G X 1" 3 ML MISC USE AS DIRECTED FOR IM INJECTION 50 each 11  . testosterone cypionate (DEPOTESTOSTERONE CYPIONATE) 200 MG/ML injection INJECT TWO ML (CC) INTRAMUSCULARLY EVERY 14 DAYS 10 mL 3   Facility-Administered Medications Prior to Visit  Medication Dose Route Frequency Provider Last Rate Last Dose  . testosterone cypionate (DEPOTESTOTERONE CYPIONATE) injection 200 mg  200 mg Intramuscular Q14 Days [name], Evie Lacks, MD   200 mg at 01/18/12 0933    ROS Review of Systems  Constitutional: Negative for appetite change, fatigue and unexpected weight change.  HENT: Negative for congestion, nosebleeds, sneezing, sore throat and trouble swallowing.   Eyes: Negative for itching and visual disturbance.  Respiratory: Negative for cough.   Cardiovascular: Negative for chest pain, palpitations and leg swelling.  Gastrointestinal: Negative for abdominal distention, blood in stool, diarrhea and nausea.  Genitourinary: Negative for frequency and hematuria.  Musculoskeletal: Negative for back pain, gait problem, joint swelling and neck pain.  Skin: Negative for rash.  Neurological: Negative for dizziness, tremors,  speech difficulty and weakness.  Psychiatric/Behavioral: Positive for decreased concentration. Negative for agitation, dysphoric mood, sleep disturbance and suicidal ideas. The patient is not nervous/anxious.     Objective:  BP (!) 148/76 (BP Location: Left Arm, Patient Position: Sitting, Cuff Size: Large)   Pulse 61   Temp 98.7 F (37.1 C) (Oral)   Ht 6\' 6"  (1.981 m)   Wt 236 lb (107 kg)   SpO2 98%   BMI 27.27 kg/m   BP Readings from Last 3 Encounters:  04/10/17 (!) 148/76  01/08/17 (!) 152/86  10/27/16 (!) 150/85    Wt Readings from Last 3 Encounters:  04/10/17 236 lb (107 kg)  01/08/17 230 lb  (104.3 kg)  10/27/16 224 lb (101.6 kg)    Physical Exam  Constitutional: He is oriented to person, place, and time. He appears well-developed. No distress.  NAD  HENT:  Mouth/Throat: Oropharynx is clear and moist.  Eyes: Conjunctivae are normal. Pupils are equal, round, and reactive to light.  Neck: Normal range of motion. No JVD present. No thyromegaly present.  Cardiovascular: Normal rate, regular rhythm, normal heart sounds and intact distal pulses. Exam reveals no gallop and no friction rub.  No murmur heard. Pulmonary/Chest: Effort normal and breath sounds normal. No respiratory distress. He has no wheezes. He has no rales. He exhibits no tenderness.  Abdominal: Soft. Bowel sounds are normal. He exhibits no distension and no mass. There is no tenderness. There is no rebound and no guarding.  Musculoskeletal: Normal range of motion. He exhibits no edema or tenderness.  Lymphadenopathy:    He has no cervical adenopathy.  Neurological: He is alert and oriented to person, place, and time. He has normal reflexes. No cranial nerve deficit. He exhibits normal muscle tone. He displays a negative Romberg sign. Coordination and gait normal.  Skin: Skin is warm and dry. No rash noted.  Psychiatric: He has a normal mood and affect. His behavior is normal. Judgment and thought content normal.    Lab Results  Component Value Date   WBC 10.1 01/08/2017   HGB 17.3 (H) 01/08/2017   HCT 51.0 01/08/2017   PLT 332.0 01/08/2017   GLUCOSE 105 (H) 01/08/2017   CHOL 202 (H) 07/05/2016   TRIG 228.0 (H) 07/05/2016   HDL 56.70 07/05/2016   LDLDIRECT 118.0 07/05/2016   LDLCALC 122 (H) 11/29/2015   ALT 18 01/08/2017   AST 19 01/08/2017   NA 140 01/08/2017   K 5.0 01/08/2017   CL 104 01/08/2017   CREATININE 0.97 01/08/2017   BUN 12 01/08/2017   CO2 29 01/08/2017   TSH 0.69 07/05/2016   PSA 2.32 01/08/2017   HGBA1C 5.5 01/02/2013    US Scrotum  Result Date: 06/09/2016 CLINICAL DATA:  Left  testicular pain for 2 days.  No known injury. EXAM: SCROTAL ULTRASOUND DOPPLER ULTRASOUND OF THE TESTICLES TECHNIQUE: Complete ultrasound examination of the testicles, epididymis, and other scrotal structures was performed. Color and spectral Doppler ultrasound were also utilized to evaluate blood flow to the testicles. COMPARISON:  None. FINDINGS: Right testicle Measurements: 4.6 x 2.0 x 2.4 cm. No mass or microlithiasis visualized. Left testicle Measurements: 4.2 x 1.9 x 2.7 cm. No mass or microlithiasis visualized. Right epididymis:  Normal in size and appearance. Left epididymis: Edematous and hypervascular compared to the right side. Hydrocele:  Small left hydrocele is noted. Varicocele:  Varicocele is seen on the left. Pulsed Doppler interrogation of both testes demonstrates normal low resistance arterial and venous waveforms bilaterally. IMPRESSION:  Findings consistent with epididymitis on the left with an associated small left hydrocele. Left varicocele. Electronically Signed   By: Inge Rise M.D.   On: 06/09/2016 11:02   [place] Art/ven Flow Abd Pelv Doppler  Result Date: 06/09/2016 CLINICAL DATA:  Left testicular pain for 2 days.  No known injury. EXAM: SCROTAL ULTRASOUND DOPPLER ULTRASOUND OF THE TESTICLES TECHNIQUE: Complete ultrasound examination of the testicles, epididymis, and other scrotal structures was performed. Color and spectral Doppler ultrasound were also utilized to evaluate blood flow to the testicles. COMPARISON:  None. FINDINGS: Right testicle Measurements: 4.6 x 2.0 x 2.4 cm. No mass or microlithiasis visualized. Left testicle Measurements: 4.2 x 1.9 x 2.7 cm. No mass or microlithiasis visualized. Right epididymis:  Normal in size and appearance. Left epididymis: Edematous and hypervascular compared to the right side. Hydrocele:  Small left hydrocele is noted. Varicocele:  Varicocele is seen on the left. Pulsed Doppler interrogation of both testes demonstrates normal low resistance  arterial and venous waveforms bilaterally. IMPRESSION: Findings consistent with epididymitis on the left with an associated small left hydrocele. Left varicocele. Electronically Signed   By: Inge Rise M.D.   On: 06/09/2016 11:02    Assessment & Plan:   There are no diagnoses linked to this encounter. I am having [name] maintain his cholecalciferol, albuterol, SYRINGE-NEEDLE (DISP) 3 ML, multivitamin with minerals, b complex vitamins, MAGNESIUM PO, Coenzyme Q10 (CO Q 10 PO), aspirin EC, cetirizine, Ginkgo Biloba, PROBIOTIC DAILY, naproxen sodium, guaiFENesin, sildenafil, sertraline, predniSONE, amphetamine-dextroamphetamine, amphetamine-dextroamphetamine, amphetamine-dextroamphetamine, and testosterone cypionate. We will continue to administer testosterone cypionate.  No orders of the defined types were placed in this encounter.    Follow-up: No Follow-up on file.  Walker Kehr, MD

## 2017-04-10 NOTE — Assessment & Plan Note (Signed)
Labs

## 2017-04-10 NOTE — Assessment & Plan Note (Signed)
Potential benefits of a long term testosterone use as well as potential risks  and complications were explained to the patient and were aknowledged.  On testosterone

## 2017-04-28 ENCOUNTER — Other Ambulatory Visit: Payer: Self-pay | Admitting: Internal Medicine

## 2017-07-10 ENCOUNTER — Ambulatory Visit: Payer: 59 | Admitting: Family Medicine

## 2017-07-10 ENCOUNTER — Encounter: Payer: Self-pay | Admitting: Family Medicine

## 2017-07-10 ENCOUNTER — Ambulatory Visit: Payer: Self-pay | Admitting: *Deleted

## 2017-07-10 DIAGNOSIS — I10 Essential (primary) hypertension: Secondary | ICD-10-CM | POA: Diagnosis not present

## 2017-07-10 MED ORDER — LOSARTAN POTASSIUM 50 MG PO TABS: 50.0000 mg | ORAL_TABLET | Freq: Every day | ORAL | 1 refills | Status: DC

## 2017-07-10 NOTE — Patient Instructions (Addendum)
Please try the new medication that I prescribed. Please seek immediate care if you notice any numbness or one-sided body or any trouble talking.

## 2017-07-10 NOTE — Telephone Encounter (Signed)
Pt reports  Face  Flushed   Pressure  Behind  His   Eyes   bp   Elevated   Today     appt  Made  Today  With  [place]

## 2017-07-10 NOTE — Progress Notes (Signed)
Victor Montes - 62 y.o. male MRN [id number]  Date of birth: [birth year]  SUBJECTIVE:  Including CC & ROS.  Chief Complaint  Patient presents with  . Hypertension    Victor Montes is a 62 y.o. male that is presenting with hypertension. He states his blood pressure has been elevated over the past ten days.Readings have been running 160/112. He has no history of hypertension. Admits to headaches. Denies vision loss or balance issues. He was working long days last week and had more pressure than normal. He has not been ever diagnosed hypertension. Denies any chest pain or shortness of breath. Takes 1-2 drinks of caffeine per day. Does not exercise on a regular basis. He does have about 10,000 steps per day.  Review of Systems  Constitutional: Negative for fever.  Respiratory: Negative for cough.   Cardiovascular: Negative for chest pain and leg swelling.  Gastrointestinal: Negative for abdominal pain.  Musculoskeletal: Negative for gait problem.  Skin: Negative for color change.  Neurological: Negative for weakness.  Hematological: Negative for adenopathy.  Psychiatric/Behavioral: Negative for agitation.    HISTORY: Past Medical, Surgical, Social, and Family History Reviewed & Updated per EMR.   Pertinent Historical Findings include:  Past Medical History:  Diagnosis Date  . ADD (attention deficit disorder)   . Allergic rhinitis   . Asthma   . Cervical radiculopathy 2007   Right- Dr. Hal Neer   . Constipation 2011  . Glucose intolerance (impaired glucose tolerance)   . Hyperlipidemia   . Hypogonadism male   . Occipital neuralgia 2011   Left  . OSA (obstructive sleep apnea)     Past Surgical History:  Procedure Laterality Date  . UVULOPALATOPLASTY     s/p    No Known Allergies  Family History  Problem Relation Age of Onset  . Colon cancer Mother   . Cancer Mother 10       colon ca  . Stroke Father   . Heart disease Father        A fib  . Hypertension Other   .  Coronary artery disease Neg Hx      Social History   Socioeconomic History  . Marital status: Single    Spouse name: Not on file  . Number of children: Not on file  . Years of education: Not on file  . Highest education level: Not on file  Social Needs  . Financial resource strain: Not on file  . Food insecurity - worry: Not on file  . Food insecurity - inability: Not on file  . Transportation needs - medical: Not on file  . Transportation needs - non-medical: Not on file  Occupational History  . Not on file  Tobacco Use  . Smoking status: Current Some Day Smoker    Packs/day: 0.10    Years: 20.00    Pack years: 2.00    Types: Cigarettes  . Smokeless tobacco: Never Used  Substance and Sexual Activity  . Alcohol use: Yes    Alcohol/week: 1.8 oz    Types: 3 Cans of beer per week  . Drug use: No  . Sexual activity: Yes  Other Topics Concern  . Not on file  Social History Narrative  . Not on file     PHYSICAL EXAM:  VS: BP (!) 158/108 (BP Location: Right Arm, Patient Position: Sitting, Cuff Size: Large)   Pulse 76   Temp 97.6 F (36.4 C) (Oral)   Ht 6\' 6"  (1.981 m)   Wt 243 lb (  110.2 kg)   SpO2 97%   BMI 28.08 kg/m  Physical Exam Gen: NAD, alert, cooperative with exam, well-appearing ENT: normal lips, normal nasal mucosa,  Eye: normal EOM, normal conjunctiva and lids CV:  no edema, +2 pedal pulses, regular rate and rhythm, S1-S2   Resp: no accessory muscle use, non-labored, clear to auscultation bilaterally, no crackles or wheezes Skin: no rashes, no areas of induration  Neuro: normal tone, normal sensation to touch Psych:  normal insight, alert and oriented MSK: Normal gait, normal strength       ASSESSMENT & PLAN:   Hypertension This is new to him. Unclear as to why his Bp has acutely risen  - losartan today  - has f/u est.

## 2017-07-10 NOTE — Telephone Encounter (Signed)
  Reason for Disposition . Systolic BP  >= 443 OR Diastolic >= 154  Answer Assessment - Initial Assessment Questions 1. BLOOD PRESSURE: "What is the blood pressure?" "Did you take at least two measurements 5 minutes apart?"     162/102 2. ONSET: "When did you take your blood pressure?"       approx   1  Hour  3. HOW: "How did you obtain the blood pressure?" (e.g., visiting nurse, automatic home BP monitor)      Nurse   At  Work  [place]  4. HISTORY: "Do you have a history of high blood pressure?"       BORDERLINE 5. MEDICATIONS: "Are you taking any medications for blood pressure?" "Have you missed any doses recently?"       Nothing   6. OTHER SYMPTOMS: "Do you have any symptoms?" (e.g., headache, chest pain, blurred vision, difficulty breathing, weakness)     Pressure   Behind  His  Eyes  Appearance  Is flushed    7. PREGNANCY: "Is there any chance you are pregnant?" "When was your last menstrual period?"     n/a  Protocols used: HIGH BLOOD PRESSURE-A-AH

## 2017-07-11 ENCOUNTER — Encounter: Payer: Self-pay | Admitting: Internal Medicine

## 2017-07-11 ENCOUNTER — Other Ambulatory Visit (INDEPENDENT_AMBULATORY_CARE_PROVIDER_SITE_OTHER): Payer: 59

## 2017-07-11 ENCOUNTER — Ambulatory Visit: Payer: 59 | Admitting: Internal Medicine

## 2017-07-11 VITALS — BP 142/82 | HR 63 | Temp 98.6°F | Ht 78.0 in | Wt 244.0 lb

## 2017-07-11 DIAGNOSIS — G4733 Obstructive sleep apnea (adult) (pediatric): Secondary | ICD-10-CM

## 2017-07-11 DIAGNOSIS — E739 Lactose intolerance, unspecified: Secondary | ICD-10-CM

## 2017-07-11 DIAGNOSIS — J45909 Unspecified asthma, uncomplicated: Secondary | ICD-10-CM | POA: Diagnosis not present

## 2017-07-11 DIAGNOSIS — I10 Essential (primary) hypertension: Secondary | ICD-10-CM | POA: Insufficient documentation

## 2017-07-11 DIAGNOSIS — R5383 Other fatigue: Secondary | ICD-10-CM

## 2017-07-11 DIAGNOSIS — F988 Other specified behavioral and emotional disorders with onset usually occurring in childhood and adolescence: Secondary | ICD-10-CM | POA: Diagnosis not present

## 2017-07-11 DIAGNOSIS — R634 Abnormal weight loss: Secondary | ICD-10-CM | POA: Diagnosis not present

## 2017-07-11 DIAGNOSIS — R3915 Urgency of urination: Secondary | ICD-10-CM

## 2017-07-11 DIAGNOSIS — E291 Testicular hypofunction: Secondary | ICD-10-CM | POA: Diagnosis not present

## 2017-07-11 LAB — BASIC METABOLIC PANEL
BUN: 14 mg/dL (ref 6–23)
CHLORIDE: 101 meq/L (ref 96–112)
CO2: 32 mEq/L (ref 19–32)
CREATININE: 1.01 mg/dL (ref 0.40–1.50)
Calcium: 10 mg/dL (ref 8.4–10.5)
GFR: 79.09 mL/min (ref >60.00)
GLUCOSE: 105 mg/dL — AB (ref 70–99)
Potassium: 4.4 mEq/L (ref 3.5–5.1)
Sodium: 138 mEq/L (ref 135–145)

## 2017-07-11 LAB — CBC WITH DIFFERENTIAL/PLATELET
Basophils Absolute: 0.1 10*3/uL (ref 0.0–0.1)
Basophils Relative: 0.5 % (ref 0.0–3.0)
EOS PCT: 0.7 % (ref 0.0–5.0)
Eosinophils Absolute: 0.1 10*3/uL (ref 0.0–0.7)
HCT: 49.9 % (ref 39.0–52.0)
HEMOGLOBIN: 17 g/dL (ref 13.0–17.0)
Lymphocytes Relative: 14.7 % (ref 12.0–46.0)
Lymphs Abs: 2 10*3/uL (ref 0.7–4.0)
MCHC: 34 g/dL (ref 30.0–36.0)
MCV: 95.5 fl (ref 78.0–100.0)
MONO ABS: 1.4 10*3/uL — AB (ref 0.1–1.0)
Monocytes Relative: 10.4 % (ref 3.0–12.0)
Neutro Abs: 10 10*3/uL — ABNORMAL HIGH (ref 1.4–7.7)
Neutrophils Relative %: 73.7 % (ref 43.0–77.0)
Platelets: 318 10*3/uL (ref 150.0–400.0)
RBC: 5.23 Mil/uL (ref 4.22–5.81)
RDW: 13.7 % (ref 11.5–15.5)
WBC: 13.5 10*3/uL — AB (ref 4.0–10.5)

## 2017-07-11 LAB — HEMOGLOBIN A1C: Hgb A1c MFr Bld: 5.6 % (ref 4.6–6.5)

## 2017-07-11 LAB — TSH: TSH: 0.96 u[IU]/mL (ref 0.35–4.50)

## 2017-07-11 MED ORDER — OLMESARTAN MEDOXOMIL 40 MG PO TABS: 40.0000 mg | ORAL_TABLET | Freq: Every day | ORAL | 3 refills | Status: DC

## 2017-07-11 NOTE — Assessment & Plan Note (Signed)
This is new to him. Unclear as to why his Bp has acutely risen  - losartan today  - has f/u est.

## 2017-07-11 NOTE — Patient Instructions (Signed)
Low salt diet Loose 10 lbs

## 2017-07-11 NOTE — Assessment & Plan Note (Signed)
Lab d

## 2017-07-11 NOTE — Assessment & Plan Note (Addendum)
Losartan Phlebotomy

## 2017-07-11 NOTE — Assessment & Plan Note (Signed)
Labs

## 2017-07-11 NOTE — Assessment & Plan Note (Signed)
Adderall Potential benefits of a long term stimulants use as well as potential risks  and complications were explained to the patient and were aknowledged. 

## 2017-07-11 NOTE — Progress Notes (Signed)
Subjective:  Patient ID: Victor Montes, male    DOB: 1955-06-23  Age: 62 y.o. MRN: [id number]  CC: No chief complaint on file.   HPI Victor Montes presents for HTN - worse lately. He took Losartan yesterday  Outpatient Medications Prior to Visit  Medication Sig Dispense Refill  . albuterol (PROAIR HFA) 108 (90 Base) MCG/ACT inhaler Inhale 2 puffs into the lungs every 6 (six) hours as needed for wheezing or shortness of breath. 3 Inhaler 3  . amphetamine-dextroamphetamine (ADDERALL) 10 MG tablet Take 1 tablet (10 mg total) by mouth 2 (two) times daily. Please fill on or after 06/27/17 60 tablet 0  . aspirin EC 81 MG tablet Take 81 mg by mouth daily.    Marland Kitchen b complex vitamins tablet Take 1 tablet by mouth daily.    . cetirizine (ZYRTEC) 10 MG tablet Take 10 mg by mouth daily.    . cholecalciferol (VITAMIN D) 1000 UNITS tablet Take 1,000 Units by mouth daily.      . Coenzyme Q10 (CO Q 10 PO) Take 1 tablet by mouth daily.    Marland Kitchen guaiFENesin (MUCINEX) 600 MG 12 hr tablet Take 600 mg by mouth daily with breakfast.    . losartan (COZAAR) 50 MG tablet Take 1 tablet (50 mg total) by mouth daily. 30 tablet 1  . MAGNESIUM PO Take 1 tablet by mouth daily.    . Multiple Vitamin (MULTIVITAMIN WITH MINERALS) TABS tablet Take 1 tablet by mouth daily.    . Probiotic Product (PROBIOTIC DAILY) CAPS Take 1 capsule by mouth daily.    . sertraline (ZOLOFT) 50 MG tablet TAKE 1 TABLET DAILY 90 tablet 3  . sildenafil (VIAGRA) 100 MG tablet Take 1 tablet (100 mg total) by mouth as needed for erectile dysfunction. 30 tablet 11  . SYRINGE-NEEDLE, DISP, 3 ML (BD ECLIPSE SYRINGE) 25G X 1" 3 ML MISC USE AS DIRECTED FOR IM INJECTION 50 each 11  . testosterone cypionate (DEPOTESTOSTERONE CYPIONATE) 200 MG/ML injection INJECT TWO ML (CC) INTRAMUSCULARLY EVERY 14 DAYS 10 mL 3  . sertraline (ZOLOFT) 50 MG tablet TAKE 1 TABLET DAILY 90 tablet 2   Facility-Administered Medications Prior to Visit  Medication Dose Route  Frequency Provider Last Rate Last Dose  . testosterone cypionate (DEPOTESTOTERONE CYPIONATE) injection 200 mg  200 mg Intramuscular Q14 Days Plotnikov, Evie Lacks, MD   200 mg at 01/18/12 0933    ROS Review of Systems  Objective:  BP (!) 142/82 (BP Location: Left Arm, Patient Position: Sitting, Cuff Size: Large)   Pulse 63   Temp 98.6 F (37 C) (Oral)   Ht 6\' 6"  (1.981 m)   Wt 244 lb (110.7 kg)   SpO2 98%   BMI 28.20 kg/m   BP Readings from Last 3 Encounters:  07/11/17 (!) 142/82  07/10/17 (!) 158/108  04/10/17 (!) 148/76    Wt Readings from Last 3 Encounters:  07/11/17 244 lb (110.7 kg)  07/10/17 243 lb (110.2 kg)  04/10/17 236 lb (107 kg)    Physical Exam  Lab Results  Component Value Date   WBC 10.1 01/08/2017   HGB 17.3 (H) 01/08/2017   HCT 51.0 01/08/2017   PLT 332.0 01/08/2017   GLUCOSE 105 (H) 01/08/2017   CHOL 202 (H) 07/05/2016   TRIG 228.0 (H) 07/05/2016   HDL 56.70 07/05/2016   LDLDIRECT 118.0 07/05/2016   LDLCALC 122 (H) 11/29/2015   ALT 18 01/08/2017   AST 19 01/08/2017   NA 140 01/08/2017   K  5.0 01/08/2017   CL 104 01/08/2017   CREATININE 0.97 01/08/2017   BUN 12 01/08/2017   CO2 29 01/08/2017   TSH 0.69 07/05/2016   PSA 2.32 01/08/2017   HGBA1C 5.5 01/02/2013    US Scrotum  Result Date: 06/09/2016 CLINICAL DATA:  Left testicular pain for 2 days.  No known injury. EXAM: SCROTAL ULTRASOUND DOPPLER ULTRASOUND OF THE TESTICLES TECHNIQUE: Complete ultrasound examination of the testicles, epididymis, and other scrotal structures was performed. Color and spectral Doppler ultrasound were also utilized to evaluate blood flow to the testicles. COMPARISON:  None. FINDINGS: Right testicle Measurements: 4.6 x 2.0 x 2.4 cm. No mass or microlithiasis visualized. Left testicle Measurements: 4.2 x 1.9 x 2.7 cm. No mass or microlithiasis visualized. Right epididymis:  Normal in size and appearance. Left epididymis: Edematous and hypervascular compared to the  right side. Hydrocele:  Small left hydrocele is noted. Varicocele:  Varicocele is seen on the left. Pulsed Doppler interrogation of both testes demonstrates normal low resistance arterial and venous waveforms bilaterally. IMPRESSION: Findings consistent with epididymitis on the left with an associated small left hydrocele. Left varicocele. Electronically Signed   By: Inge Rise M.D.   On: 06/09/2016 11:02   [place] Art/ven Flow Abd Pelv Doppler  Result Date: 06/09/2016 CLINICAL DATA:  Left testicular pain for 2 days.  No known injury. EXAM: SCROTAL ULTRASOUND DOPPLER ULTRASOUND OF THE TESTICLES TECHNIQUE: Complete ultrasound examination of the testicles, epididymis, and other scrotal structures was performed. Color and spectral Doppler ultrasound were also utilized to evaluate blood flow to the testicles. COMPARISON:  None. FINDINGS: Right testicle Measurements: 4.6 x 2.0 x 2.4 cm. No mass or microlithiasis visualized. Left testicle Measurements: 4.2 x 1.9 x 2.7 cm. No mass or microlithiasis visualized. Right epididymis:  Normal in size and appearance. Left epididymis: Edematous and hypervascular compared to the right side. Hydrocele:  Small left hydrocele is noted. Varicocele:  Varicocele is seen on the left. Pulsed Doppler interrogation of both testes demonstrates normal low resistance arterial and venous waveforms bilaterally. IMPRESSION: Findings consistent with epididymitis on the left with an associated small left hydrocele. Left varicocele. Electronically Signed   By: Inge Rise M.D.   On: 06/09/2016 11:02    Assessment & Plan:   There are no diagnoses linked to this encounter. I am having [name] maintain his cholecalciferol, albuterol, SYRINGE-NEEDLE (DISP) 3 ML, multivitamin with minerals, b complex vitamins, MAGNESIUM PO, Coenzyme Q10 (CO Q 10 PO), aspirin EC, cetirizine, PROBIOTIC DAILY, guaiFENesin, sildenafil, testosterone cypionate, amphetamine-dextroamphetamine, sertraline, and  losartan. We will continue to administer testosterone cypionate.  No orders of the defined types were placed in this encounter.    Follow-up: No Follow-up on file.  Walker Kehr, MD

## 2017-08-08 ENCOUNTER — Other Ambulatory Visit: Payer: Self-pay | Admitting: Internal Medicine

## 2017-08-27 NOTE — Progress Notes (Signed)
[name] [place], [place] [address] Phone: [phone] Subjective:     CC: left heel pain  [id number]  Victor Montes is a 62 y.o. male coming in with complaint of left heel pain. He has been having the pain for months. Pain is on the bottom of the heel. Pain increases with walking. He does wear supportive shoes the majority of the time. He uses heel lifts for his achilles and Spenco inserts in his shoes. He has tried to use a night splint and stretches for the heel cord as well. Pain is constant.  Patient does not remember any type of injury.      Past Medical History:  Diagnosis Date  . ADD (attention deficit disorder)   . Allergic rhinitis   . Asthma   . Cervical radiculopathy 2007   Right- Dr. Hal Neer   . Constipation 2011  . Glucose intolerance (impaired glucose tolerance)   . Hyperlipidemia   . Hypogonadism male   . Occipital neuralgia 2011   Left  . OSA (obstructive sleep apnea)    Past Surgical History:  Procedure Laterality Date  . UVULOPALATOPLASTY     s/p   Social History   Socioeconomic History  . Marital status: Single    Spouse name: Not on file  . Number of children: Not on file  . Years of education: Not on file  . Highest education level: Not on file  Occupational History  . Not on file  Social Needs  . Financial resource strain: Not on file  . Food insecurity:    Worry: Not on file    Inability: Not on file  . Transportation needs:    Medical: Not on file    Non-medical: Not on file  Tobacco Use  . Smoking status: Current Some Day Smoker    Packs/day: 0.10    Years: 20.00    Pack years: 2.00    Types: Cigarettes  . Smokeless tobacco: Never Used  Substance and Sexual Activity  . Alcohol use: Yes    Alcohol/week: 1.8 oz    Types: 3 Cans of beer per week  . Drug use: No  . Sexual activity: Yes  Lifestyle  . Physical activity:    Days per week: Not on file    Minutes per session: Not on  file  . Stress: Not on file  Relationships  . Social connections:    Talks on phone: Not on file    Gets together: Not on file    Attends religious service: Not on file    Active member of club or organization: Not on file    Attends meetings of clubs or organizations: Not on file    Relationship status: Not on file  Other Topics Concern  . Not on file  Social History Narrative  . Not on file   No Known Allergies Family History  Problem Relation Age of Onset  . Colon cancer Mother   . Cancer Mother 49       colon ca  . Stroke Father   . Heart disease Father        A fib  . Hypertension Other   . Coronary artery disease Neg Hx      Past medical history, social, surgical and family history all reviewed in electronic medical record.  No pertanent information unless stated regarding to the chief complaint.   Review of Systems:Review of systems updated and as accurate as of 08/28/17  No headache, visual changes, nausea, vomiting, diarrhea, constipation, dizziness, abdominal pain, skin rash, fevers, chills, night sweats, weight loss, swollen lymph nodes, body aches, joint swelling, muscle aches, chest pain, shortness of breath, mood changes.   Objective  Blood pressure 130/82, height 6\' 6"  (1.981 m), weight 244 lb (110.7 kg). Systems examined below as of 08/28/17   General: No apparent distress alert and oriented x3 mood and affect normal, dressed appropriately.  HEENT: Pupils equal, extraocular movements intact  Respiratory: Patient's speak in full sentences and does not appear short of breath  Cardiovascular: No lower extremity edema, non tender, no erythema  Skin: Warm dry intact with no signs of infection or rash on extremities or on axial skeleton.  Abdomen: Soft nontender  Neuro: Cranial nerves II through XII are intact, neurovascularly intact in all extremities with 2+ DTRs and 2+ pulses.  Lymph: No lymphadenopathy of posterior or anterior cervical chain or axillae  bilaterally.  Gait normal with good balance and coordination.  MSK:  Non tender with full range of motion and good stability and symmetric strength and tone of shoulders, elbows, wrist, hip, knee and ankles bilaterally.  Left heel exam shows severe tenderness on the plantar aspect approximately 1 cm from the Achilles.  No pain over the Achilles itself.  Some pain with dorsiflexion on the plantar aspect of the foot.  Neurovascularly intact distally.  Limited musculoskeletal ultrasound was performed and interpreted by Lyndal Pulley  Limited ultrasound of patient's left heel shows the patient does have a cortical defect noted with some mild callus formation noted.  Mild enlargement of the plantar fascia at 1.39 cm.  Hypoechoic changes in the area but no increasing in Doppler flow. Impression: Stress reaction versus stress fracture with mild callus formation of the calcaneal region with reactive plantar fasciitis    Impression and Recommendations:     This case required medical decision making of moderate complexity.      Note: This dictation was prepared with Dragon dictation along with smaller phrase technology. Any transcriptional errors that result from this process are unintentional.

## 2017-08-28 ENCOUNTER — Ambulatory Visit: Payer: 59 | Admitting: Family Medicine

## 2017-08-28 ENCOUNTER — Encounter: Payer: Self-pay | Admitting: Family Medicine

## 2017-08-28 ENCOUNTER — Ambulatory Visit: Payer: Self-pay

## 2017-08-28 VITALS — BP 130/82 | Ht 78.0 in | Wt 244.0 lb

## 2017-08-28 DIAGNOSIS — M79672 Pain in left foot: Secondary | ICD-10-CM | POA: Diagnosis not present

## 2017-08-28 MED ORDER — VITAMIN D (ERGOCALCIFEROL) 1.25 MG (50000 UNIT) PO CAPS: 50000.0000 [IU] | ORAL_CAPSULE | ORAL | 0 refills | Status: DC

## 2017-08-28 NOTE — Assessment & Plan Note (Signed)
Seems to be more secondary to a fracture.  Discussed with patient about icing regimen, home exercises, heel donut, we discussed which activities to do which wants to avoid.  Once weekly vitamin D given.  Follow-up again 4 weeks

## 2017-08-28 NOTE — Patient Instructions (Addendum)
Good to see you  Heel donut daily  Avoid being barefoot.  Once weekly vitamin D for 12 weeks K2 over the counter daily for 1 month.  More ice is your friend.  Try elliptical or biking.  See me again in 2-3 weeks.

## 2017-08-30 ENCOUNTER — Encounter: Payer: Self-pay | Admitting: Internal Medicine

## 2017-09-05 NOTE — Progress Notes (Signed)
[name] [place], [place] [address] Phone: [phone] Subjective:     CC: Heel pain follow-up  [id number]  Victor Montes is a 62 y.o. male coming in with complaint of heel pain.  Was found to have a left-sided calcaneal fracture.  Patient states that he is feeling 80% better since he has been doing the heel donut.      Past Medical History:  Diagnosis Date  . ADD (attention deficit disorder)   . Allergic rhinitis   . Asthma   . Cervical radiculopathy 2007   Right- Dr. Hal Neer   . Constipation 2011  . Glucose intolerance (impaired glucose tolerance)   . Hyperlipidemia   . Hypogonadism male   . Occipital neuralgia 2011   Left  . OSA (obstructive sleep apnea)    Past Surgical History:  Procedure Laterality Date  . UVULOPALATOPLASTY     s/p   Social History   Socioeconomic History  . Marital status: Single    Spouse name: Not on file  . Number of children: Not on file  . Years of education: Not on file  . Highest education level: Not on file  Occupational History  . Not on file  Social Needs  . Financial resource strain: Not on file  . Food insecurity:    Worry: Not on file    Inability: Not on file  . Transportation needs:    Medical: Not on file    Non-medical: Not on file  Tobacco Use  . Smoking status: Current Some Day Smoker    Packs/day: 0.10    Years: 20.00    Pack years: 2.00    Types: Cigarettes  . Smokeless tobacco: Never Used  Substance and Sexual Activity  . Alcohol use: Yes    Alcohol/week: 1.8 oz    Types: 3 Cans of beer per week  . Drug use: No  . Sexual activity: Yes  Lifestyle  . Physical activity:    Days per week: Not on file    Minutes per session: Not on file  . Stress: Not on file  Relationships  . Social connections:    Talks on phone: Not on file    Gets together: Not on file    Attends religious service: Not on file    Active member of club or organization: Not on file    Attends meetings of clubs or organizations: Not on file    Relationship status: Not on file  Other Topics Concern  . Not on file  Social History Narrative  . Not on file   No Known Allergies Family History  Problem Relation Age of Onset  . Colon cancer Mother   . Cancer Mother 23       colon ca  . Stroke Father   . Heart disease Father        A fib  . Hypertension Other   . Coronary artery disease Neg Hx      Past medical history, social, surgical and family history all reviewed in electronic medical record.  No pertanent information unless stated regarding to the chief complaint.   Review of Systems:Review of systems updated and as accurate as of 09/06/17  No headache, visual changes, nausea, vomiting, diarrhea, constipation, dizziness, abdominal pain, skin rash, fevers, chills, night sweats, weight loss, swollen lymph nodes, body aches, joint swelling, muscle aches, chest pain, shortness of breath, mood changes.   Objective  Blood pressure 138/84, pulse 71, height 6\' 6"  (  1.981 m), weight 244 lb (110.7 kg), SpO2 96 %. Systems examined below as of 09/06/17   General: No apparent distress alert and oriented x3 mood and affect normal, dressed appropriately.  HEENT: Pupils equal, extraocular movements intact  Respiratory: Patient's speak in full sentences and does not appear short of breath  Cardiovascular: No lower extremity edema, non tender, no erythema  Skin: Warm dry intact with no signs of infection or rash on extremities or on axial skeleton.  Abdomen: Soft nontender  Neuro: Cranial nerves II through XII are intact, neurovascularly intact in all extremities with 2+ DTRs and 2+ pulses.  Lymph: No lymphadenopathy of posterior or anterior cervical chain or axillae bilaterally.  Gait normal with good balance and coordination.  MSK:  Non tender with full range of motion and good stability and symmetric strength and tone of shoulders, elbows, wrist, hip, knee and ankles  bilaterally.  Foot exam shows the patient has no swelling.  Patient does have breakdown the transverse and longitudinal arch.  Still tender to palpation of the plantar aspect of the calcaneal region.  Full range of motion of the ankle.  Good capillary refill.  Limited musculoskeletal ultrasound was performed and interpreted by Lyndal Pulley  Limited ultrasound shows calcaneal.  Fracture does have good callus formation and significant decrease in hypoechoic changes.  Plantar fascia measures 1.22 cm Pression: Interval healing of calcaneal fracture    Impression and Recommendations:     This case required medical decision making of moderate complexity.      Note: This dictation was prepared with Dragon dictation along with smaller phrase technology. Any transcriptional errors that result from this process are unintentional.

## 2017-09-06 ENCOUNTER — Encounter: Payer: Self-pay | Admitting: Family Medicine

## 2017-09-06 ENCOUNTER — Ambulatory Visit: Payer: 59 | Admitting: Family Medicine

## 2017-09-06 ENCOUNTER — Ambulatory Visit: Payer: Self-pay

## 2017-09-06 VITALS — BP 138/84 | HR 71 | Ht 78.0 in | Wt 244.0 lb

## 2017-09-06 DIAGNOSIS — M79672 Pain in left foot: Secondary | ICD-10-CM

## 2017-09-06 MED ORDER — PREDNISONE 50 MG PO TABS: 50.0000 mg | ORAL_TABLET | Freq: Every day | ORAL | 0 refills | Status: DC

## 2017-09-06 MED ORDER — DICLOFENAC SODIUM 2 % TD SOLN: TRANSDERMAL | 3 refills | Status: DC

## 2017-09-06 NOTE — Assessment & Plan Note (Signed)
Calcaneal stress reaction is significantly improved at this time.  Had been dealing with it for quite some time.  Discussed icing regimen and home exercises.  Patient will continue to use the heel donut as well as the vitamin D.  Follow-up again 4 weeks

## 2017-09-06 NOTE — Patient Instructions (Signed)
looks great  Ice 20 minutes 2 times daily. Usually after activity and before bed. Keep doing the heel donut.  Lets have a great trip but worsening pain then try the prednisone pennsaid pinkie amount topically 2 times daily as needed.  See me again in 4 weeks

## 2017-09-26 ENCOUNTER — Telehealth: Payer: Self-pay | Admitting: Internal Medicine

## 2017-09-26 MED ORDER — AMPHETAMINE-DEXTROAMPHETAMINE 10 MG PO TABS: 10.0000 mg | ORAL_TABLET | Freq: Two times a day (BID) | ORAL | 0 refills | Status: DC

## 2017-09-26 NOTE — Telephone Encounter (Signed)
Copied from [place] [phone]. Topic: General - Other >> Sep 26, 2017 10:50 AM Oneta Rack wrote: Relation to pt: self  Call back number:[phone]   Reason for call:  Patient requesting amphetamine-dextroamphetamine (ADDERALL) 10 MG tablet, patient advised call office and patient informed to please allow 48 to 72 hour turn around, please advise

## 2017-09-26 NOTE — Telephone Encounter (Signed)
Check [place] registry last filled 07/17/2017.Marland KitchenJohny Chess

## 2017-09-26 NOTE — Telephone Encounter (Signed)
Will ref Thx

## 2017-10-08 NOTE — Progress Notes (Signed)
[name] [place], [place] [address] Phone: [phone] Subjective:    I'm seeing this patient by the request  of:    CC: Heel pain  [id number]  Victor Montes is a 62 y.o. male coming in with complaint of heel pain. He has been using a heel lift with a cutout in it. He has also been using a gel insole. He feels that he is doing better but is still not 100%. Is using a night splint. Pain in mornings and later in the day. He did take a trip which he did a lot of walking. He was able to bear the trip but contrast therapy.     Past Medical History:  Diagnosis Date  . ADD (attention deficit disorder)   . Allergic rhinitis   . Asthma   . Cervical radiculopathy 2007   Right- Dr. Hal Neer   . Constipation 2011  . Glucose intolerance (impaired glucose tolerance)   . Hyperlipidemia   . Hypogonadism male   . Occipital neuralgia 2011   Left  . OSA (obstructive sleep apnea)    Past Surgical History:  Procedure Laterality Date  . UVULOPALATOPLASTY     s/p   Social History   Socioeconomic History  . Marital status: Single    Spouse name: Not on file  . Number of children: Not on file  . Years of education: Not on file  . Highest education level: Not on file  Occupational History  . Not on file  Social Needs  . Financial resource strain: Not on file  . Food insecurity:    Worry: Not on file    Inability: Not on file  . Transportation needs:    Medical: Not on file    Non-medical: Not on file  Tobacco Use  . Smoking status: Current Some Day Smoker    Packs/day: 0.10    Years: 20.00    Pack years: 2.00    Types: Cigarettes  . Smokeless tobacco: Never Used  Substance and Sexual Activity  . Alcohol use: Yes    Alcohol/week: 1.8 oz    Types: 3 Cans of beer per week  . Drug use: No  . Sexual activity: Yes  Lifestyle  . Physical activity:    Days per week: Not on file    Minutes per session: Not on file  . Stress: Not on  file  Relationships  . Social connections:    Talks on phone: Not on file    Gets together: Not on file    Attends religious service: Not on file    Active member of club or organization: Not on file    Attends meetings of clubs or organizations: Not on file    Relationship status: Not on file  Other Topics Concern  . Not on file  Social History Narrative  . Not on file   No Known Allergies Family History  Problem Relation Age of Onset  . Colon cancer Mother   . Cancer Mother 34       colon ca  . Stroke Father   . Heart disease Father        A fib  . Hypertension Other   . Coronary artery disease Neg Hx      Past medical history, social, surgical and family history all reviewed in electronic medical record.  No pertanent information unless stated regarding to the chief complaint.   Review of Systems:Review of systems updated and as accurate  as of 10/08/17  No headache, visual changes, nausea, vomiting, diarrhea, constipation, dizziness, abdominal pain, skin rash, fevers, chills, night sweats, weight loss, swollen lymph nodes, body aches, joint swelling, muscle aches, chest pain, shortness of breath, mood changes.   Objective  There were no vitals taken for this visit. Systems examined below as of 10/08/17   General: No apparent distress alert and oriented x3 mood and affect normal, dressed appropriately.  HEENT: Pupils equal, extraocular movements intact  Respiratory: Patient's speak in full sentences and does not appear short of breath  Cardiovascular: No lower extremity edema, non tender, no erythema  Skin: Warm dry intact with no signs of infection or rash on extremities or on axial skeleton.  Abdomen: Soft nontender  Neuro: Cranial nerves II through XII are intact, neurovascularly intact in all extremities with 2+ DTRs and 2+ pulses.  Lymph: No lymphadenopathy of posterior or anterior cervical chain or axillae bilaterally.  Gait normal with good balance and  coordination.  MSK:  Non tender with full range of motion and good stability and symmetric strength and tone of shoulders, elbows, wrist, hip, knee and ankles bilaterally.  Foot exam left heel still has tenderness to palpation on the plantar aspect.  Still has significant pes planus noted with the same breakdown as previous.  Tenderness is about the same as previous exam.  Neurovascularly intact distally.  Full range of motion of the ankle.  Procedure: Real-time Ultrasound Guided Injection of left plantar fascia at the origin Device: GE Logiq Q7 Ultrasound guided injection is preferred based studies that show increased duration, increased effect, greater accuracy, decreased procedural pain, increased response rate, and decreased cost with ultrasound guided versus blind injection.  Verbal informed consent obtained.  Time-out conducted.  Noted no overlying erythema, induration, or other signs of local infection.  Skin prepped in a sterile fashion.  Local anesthesia: Topical Ethyl chloride.  With sterile technique and under real time ultrasound guidance: With a 21-gauge 2 inch needle patient was injected with 0.5 cc of 0.5% Marcaine and 0.5 cc Kenalog 40 mg/mL over the medial calcaneal region. Completed without difficulty  Pain immediately resolved suggesting accurate placement of the medication.  Advised to call if fevers/chills, erythema, induration, drainage, or persistent bleeding.  Images permanently stored and available for review in the ultrasound unit.  Impression: Technically successful ultrasound guided injection.    Impression and Recommendations:     This case required medical decision making of moderate complexity.      Note: This dictation was prepared with Dragon dictation along with smaller phrase technology. Any transcriptional errors that result from this process are unintentional.

## 2017-10-09 ENCOUNTER — Other Ambulatory Visit (INDEPENDENT_AMBULATORY_CARE_PROVIDER_SITE_OTHER): Payer: Self-pay

## 2017-10-09 ENCOUNTER — Encounter: Payer: Self-pay | Admitting: Family Medicine

## 2017-10-09 ENCOUNTER — Ambulatory Visit: Payer: 59 | Admitting: Internal Medicine

## 2017-10-09 ENCOUNTER — Ambulatory Visit: Payer: Self-pay

## 2017-10-09 ENCOUNTER — Ambulatory Visit: Payer: 59 | Admitting: Family Medicine

## 2017-10-09 ENCOUNTER — Encounter: Payer: Self-pay | Admitting: Internal Medicine

## 2017-10-09 VITALS — BP 132/82 | HR 68 | Ht 78.0 in | Wt 248.0 lb

## 2017-10-09 DIAGNOSIS — M79672 Pain in left foot: Secondary | ICD-10-CM

## 2017-10-09 DIAGNOSIS — I10 Essential (primary) hypertension: Secondary | ICD-10-CM

## 2017-10-09 DIAGNOSIS — J4521 Mild intermittent asthma with (acute) exacerbation: Secondary | ICD-10-CM

## 2017-10-09 DIAGNOSIS — M2550 Pain in unspecified joint: Secondary | ICD-10-CM | POA: Diagnosis not present

## 2017-10-09 DIAGNOSIS — F988 Other specified behavioral and emotional disorders with onset usually occurring in childhood and adolescence: Secondary | ICD-10-CM | POA: Diagnosis not present

## 2017-10-09 DIAGNOSIS — J45909 Unspecified asthma, uncomplicated: Secondary | ICD-10-CM | POA: Insufficient documentation

## 2017-10-09 DIAGNOSIS — E291 Testicular hypofunction: Secondary | ICD-10-CM

## 2017-10-09 LAB — IBC PANEL
Iron: 74 ug/dL (ref 42–165)
SATURATION RATIOS: 22.3 % (ref 20.0–50.0)
Transferrin: 237 mg/dL (ref 212.0–360.0)

## 2017-10-09 LAB — URIC ACID: URIC ACID, SERUM: 4.8 mg/dL (ref 4.0–7.8)

## 2017-10-09 LAB — SEDIMENTATION RATE: SED RATE: 15 mm/h (ref 0–20)

## 2017-10-09 MED ORDER — FLUTICASONE FUROATE-VILANTEROL 100-25 MCG/INH IN AEPB: 1.0000 | INHALATION_SPRAY | Freq: Every day | RESPIRATORY_TRACT | 11 refills | Status: DC

## 2017-10-09 MED ORDER — AMPHETAMINE-DEXTROAMPHETAMINE 10 MG PO TABS: 10.0000 mg | ORAL_TABLET | Freq: Two times a day (BID) | ORAL | 0 refills | Status: DC

## 2017-10-09 NOTE — Progress Notes (Signed)
Subjective:  Patient ID: Victor Montes, male    DOB: 06-20-1955  Age: 62 y.o. MRN: [id number]  CC: No chief complaint on file.   HPI Victor Montes presents for ADD, HTN, hypogonadism f/u C/o wheezing after he came back from [place].  No chest pain or shortness of breath.  Overall doing well.  Outpatient Medications Prior to Visit  Medication Sig Dispense Refill  . albuterol (PROAIR HFA) 108 (90 Base) MCG/ACT inhaler Inhale 2 puffs into the lungs every 6 (six) hours as needed for wheezing or shortness of breath. 3 Inhaler 3  . amphetamine-dextroamphetamine (ADDERALL) 10 MG tablet Take 1 tablet (10 mg total) by mouth 2 (two) times daily. Please fill on or after 09/24/17 60 tablet 0  . aspirin EC 81 MG tablet Take 81 mg by mouth daily.    Marland Kitchen b complex vitamins tablet Take 1 tablet by mouth daily.    . cetirizine (ZYRTEC) 10 MG tablet Take 10 mg by mouth daily.    . cholecalciferol (VITAMIN D) 1000 UNITS tablet Take 1,000 Units by mouth daily.      . Coenzyme Q10 (CO Q 10 PO) Take 1 tablet by mouth daily.    . Diclofenac Sodium (PENNSAID) 2 % SOLN Apply 1 pump twice daily. 112 g 3  . guaiFENesin (MUCINEX) 600 MG 12 hr tablet Take 600 mg by mouth daily with breakfast.    . losartan (COZAAR) 50 MG tablet Take 1 tablet (50 mg total) by mouth daily. 30 tablet 1  . MAGNESIUM PO Take 1 tablet by mouth daily.    . Multiple Vitamin (MULTIVITAMIN WITH MINERALS) TABS tablet Take 1 tablet by mouth daily.    Marland Kitchen olmesartan (BENICAR) 40 MG tablet Take 1 tablet (40 mg total) by mouth daily. 90 tablet 3  . predniSONE (DELTASONE) 50 MG tablet Take 1 tablet (50 mg total) by mouth daily. 5 tablet 0  . Probiotic Product (PROBIOTIC DAILY) CAPS Take 1 capsule by mouth daily.    . sertraline (ZOLOFT) 50 MG tablet TAKE 1 TABLET DAILY 90 tablet 3  . sildenafil (VIAGRA) 100 MG tablet Take 1 tablet (100 mg total) by mouth as needed for erectile dysfunction. 30 tablet 11  . SYRINGE-NEEDLE, DISP, 3 ML (BD ECLIPSE  SYRINGE) 25G X 1" 3 ML MISC USE AS DIRECTED FOR IM INJECTION 50 each 11  . testosterone cypionate (DEPOTESTOSTERONE CYPIONATE) 200 MG/ML injection INJECT 2 ML (CC) INTRAMUSCULARLY EVERY 14 DAYS 10 mL 3  . Vitamin D, Ergocalciferol, (DRISDOL) 50000 units CAPS capsule Take 1 capsule (50,000 Units total) by mouth every 7 (seven) days. 12 capsule 0   Facility-Administered Medications Prior to Visit  Medication Dose Route Frequency Provider Last Rate Last Dose  . testosterone cypionate (DEPOTESTOTERONE CYPIONATE) injection 200 mg  200 mg Intramuscular Q14 Days Christianne Zacher, Evie Lacks, MD   200 mg at 01/18/12 0933    ROS Review of Systems  Constitutional: Negative for appetite change, fatigue and unexpected weight change.  HENT: Negative for congestion, nosebleeds, sneezing, sore throat and trouble swallowing.   Eyes: Negative for itching and visual disturbance.  Respiratory: Negative for cough.   Cardiovascular: Negative for chest pain, palpitations and leg swelling.  Gastrointestinal: Negative for abdominal distention, blood in stool, diarrhea and nausea.  Genitourinary: Negative for frequency and hematuria.  Musculoskeletal: Negative for back pain, gait problem, joint swelling and neck pain.  Skin: Negative for rash.  Neurological: Negative for dizziness, tremors, speech difficulty and weakness.  Psychiatric/Behavioral: Positive for decreased concentration. Negative  for agitation, dysphoric mood and sleep disturbance. The patient is not nervous/anxious.     Objective:  BP 132/82   Pulse 68   Temp 98.7 F (37.1 C) (Oral)   Ht 6\' 6"  (1.981 m)   Wt 248 lb (112.5 kg)   SpO2 98%   BMI 28.66 kg/m   BP Readings from Last 3 Encounters:  10/09/17 132/82  10/09/17 132/82  09/06/17 138/84    Wt Readings from Last 3 Encounters:  10/09/17 248 lb (112.5 kg)  10/09/17 248 lb (112.5 kg)  09/06/17 244 lb (110.7 kg)    Physical Exam  Constitutional: He is oriented to person, place, and time.  He appears well-developed. No distress.  NAD  HENT:  Mouth/Throat: Oropharynx is clear and moist.  Eyes: Pupils are equal, round, and reactive to light. Conjunctivae are normal.  Neck: Normal range of motion. No JVD present. No thyromegaly present.  Cardiovascular: Normal rate, regular rhythm, normal heart sounds and intact distal pulses. Exam reveals no gallop and no friction rub.  No murmur heard. Pulmonary/Chest: Effort normal and breath sounds normal. No respiratory distress. He has no wheezes. He has no rales. He exhibits no tenderness.  Abdominal: Soft. Bowel sounds are normal. He exhibits no distension and no mass. There is no tenderness. There is no rebound and no guarding.  Musculoskeletal: Normal range of motion. He exhibits no edema or tenderness.  Lymphadenopathy:    He has no cervical adenopathy.  Neurological: He is alert and oriented to person, place, and time. He has normal reflexes. No cranial nerve deficit. He exhibits normal muscle tone. He displays a negative Romberg sign. Coordination and gait normal.  Skin: Skin is warm and dry. No rash noted.  Psychiatric: His behavior is normal. Judgment and thought content normal.   Legs without edema, calves nontender Lab Results  Component Value Date   WBC 13.5 (H) 07/11/2017   HGB 17.0 07/11/2017   HCT 49.9 07/11/2017   PLT 318.0 07/11/2017   GLUCOSE 105 (H) 07/11/2017   CHOL 202 (H) 07/05/2016   TRIG 228.0 (H) 07/05/2016   HDL 56.70 07/05/2016   LDLDIRECT 118.0 07/05/2016   LDLCALC 122 (H) 11/29/2015   ALT 18 01/08/2017   AST 19 01/08/2017   NA 138 07/11/2017   K 4.4 07/11/2017   CL 101 07/11/2017   CREATININE 1.01 07/11/2017   BUN 14 07/11/2017   CO2 32 07/11/2017   TSH 0.96 07/11/2017   PSA 2.32 01/08/2017   HGBA1C 5.6 07/11/2017    US Scrotum  Result Date: 06/09/2016 CLINICAL DATA:  Left testicular pain for 2 days.  No known injury. EXAM: SCROTAL ULTRASOUND DOPPLER ULTRASOUND OF THE TESTICLES TECHNIQUE:  Complete ultrasound examination of the testicles, epididymis, and other scrotal structures was performed. Color and spectral Doppler ultrasound were also utilized to evaluate blood flow to the testicles. COMPARISON:  None. FINDINGS: Right testicle Measurements: 4.6 x 2.0 x 2.4 cm. No mass or microlithiasis visualized. Left testicle Measurements: 4.2 x 1.9 x 2.7 cm. No mass or microlithiasis visualized. Right epididymis:  Normal in size and appearance. Left epididymis: Edematous and hypervascular compared to the right side. Hydrocele:  Small left hydrocele is noted. Varicocele:  Varicocele is seen on the left. Pulsed Doppler interrogation of both testes demonstrates normal low resistance arterial and venous waveforms bilaterally. IMPRESSION: Findings consistent with epididymitis on the left with an associated small left hydrocele. Left varicocele. Electronically Signed   By: Inge Rise M.D.   On: 06/09/2016 11:02  [place] Art/ven Flow Abd Pelv Doppler  Result Date: 06/09/2016 CLINICAL DATA:  Left testicular pain for 2 days.  No known injury. EXAM: SCROTAL ULTRASOUND DOPPLER ULTRASOUND OF THE TESTICLES TECHNIQUE: Complete ultrasound examination of the testicles, epididymis, and other scrotal structures was performed. Color and spectral Doppler ultrasound were also utilized to evaluate blood flow to the testicles. COMPARISON:  None. FINDINGS: Right testicle Measurements: 4.6 x 2.0 x 2.4 cm. No mass or microlithiasis visualized. Left testicle Measurements: 4.2 x 1.9 x 2.7 cm. No mass or microlithiasis visualized. Right epididymis:  Normal in size and appearance. Left epididymis: Edematous and hypervascular compared to the right side. Hydrocele:  Small left hydrocele is noted. Varicocele:  Varicocele is seen on the left. Pulsed Doppler interrogation of both testes demonstrates normal low resistance arterial and venous waveforms bilaterally. IMPRESSION: Findings consistent with epididymitis on the left with an  associated small left hydrocele. Left varicocele. Electronically Signed   By: Inge Rise M.D.   On: 06/09/2016 11:02    Assessment & Plan:   There are no diagnoses linked to this encounter. I am having [name] maintain his cholecalciferol, albuterol, SYRINGE-NEEDLE (DISP) 3 ML, multivitamin with minerals, b complex vitamins, MAGNESIUM PO, Coenzyme Q10 (CO Q 10 PO), aspirin EC, cetirizine, PROBIOTIC DAILY, guaiFENesin, sildenafil, sertraline, losartan, olmesartan, testosterone cypionate, Vitamin D (Ergocalciferol), predniSONE, Diclofenac Sodium, and amphetamine-dextroamphetamine. We will continue to administer testosterone cypionate.  No orders of the defined types were placed in this encounter.    Follow-up: No follow-ups on file.  Walker Kehr, MD

## 2017-10-09 NOTE — Assessment & Plan Note (Signed)
Heel pain given injection today.  Hopefully this will be beneficial and seems to show some improvement with patient not making any improvement since last interval.  Labs ordered today to see if anything else was causing difficulty.  Discussed icing regimen.  Follow-up again in 4 weeks

## 2017-10-09 NOTE — Assessment & Plan Note (Signed)
Testosterone

## 2017-10-09 NOTE — Patient Instructions (Addendum)
Good to see you  Ice is still your friend We tried to stimulate healing  Keep doing everything else Labs downstairs See me again 4 weeks

## 2017-10-09 NOTE — Assessment & Plan Note (Signed)
Losartan 

## 2017-10-09 NOTE — Assessment & Plan Note (Signed)
On Adderall

## 2017-10-13 LAB — RHEUMATOID FACTOR: Rhuematoid fact SerPl-aCnc: 17 IU/mL — ABNORMAL HIGH (ref <14)

## 2017-10-13 LAB — VITAMIN D 1,25 DIHYDROXY
Vitamin D 1, 25 (OH)2 Total: 50 pg/mL (ref 18–72)
Vitamin D2 1, 25 (OH)2: 10 pg/mL
Vitamin D3 1, 25 (OH)2: 40 pg/mL

## 2017-10-13 LAB — ANA: ANA: NEGATIVE

## 2017-11-06 NOTE — Progress Notes (Signed)
[facility] [place] [facility]es, [place] [address] Phone: [phone] Subjective:      CC: Left heel pain  [id number]  Victor Montes is a 62 y.o. male coming in with complaint of left heel pain. He feels that he has drastically improved since last visit. Pain is 1/10 and is intermittent with no pattern.      Past Medical History:  Diagnosis Date  . ADD (attention deficit disorder)   . Allergic rhinitis   . Asthma   . Cervical radiculopathy 2007   Right- Dr. Hal Neer   . Constipation 2011  . Glucose intolerance (impaired glucose tolerance)   . Hyperlipidemia   . Hypogonadism male   . Occipital neuralgia 2011   Left  . OSA (obstructive sleep apnea)    Past Surgical History:  Procedure Laterality Date  . UVULOPALATOPLASTY     s/p   Social History   Socioeconomic History  . Marital status: Single    Spouse name: Not on file  . Number of children: Not on file  . Years of education: Not on file  . Highest education level: Not on file  Occupational History  . Not on file  Social Needs  . Financial resource strain: Not on file  . Food insecurity:    Worry: Not on file    Inability: Not on file  . Transportation needs:    Medical: Not on file    Non-medical: Not on file  Tobacco Use  . Smoking status: Current Some Day Smoker    Packs/day: 0.10    Years: 20.00    Pack years: 2.00    Types: Cigarettes  . Smokeless tobacco: Never Used  Substance and Sexual Activity  . Alcohol use: Yes    Alcohol/week: 1.8 oz    Types: 3 Cans of beer per week  . Drug use: No  . Sexual activity: Yes  Lifestyle  . Physical activity:    Days per week: Not on file    Minutes per session: Not on file  . Stress: Not on file  Relationships  . Social connections:    Talks on phone: Not on file    Gets together: Not on file    Attends religious service: Not on file    Active member of club or organization: Not on file    Attends meetings of  clubs or organizations: Not on file    Relationship status: Not on file  Other Topics Concern  . Not on file  Social History Narrative  . Not on file   No Known Allergies Family History  Problem Relation Age of Onset  . Colon cancer Mother   . Cancer Mother 27       colon ca  . Stroke Father   . Heart disease Father        A fib  . Hypertension Other   . Coronary artery disease Neg Hx      Past medical history, social, surgical and family history all reviewed in electronic medical record.  No pertanent information unless stated regarding to the chief complaint.   Review of Systems:Review of systems updated and as accurate as of 11/07/17  No headache, visual changes, nausea, vomiting, diarrhea, constipation, dizziness, abdominal pain, skin rash, fevers, chills, night sweats, weight loss, swollen lymph nodes, body aches, joint swelling, muscle aches, chest pain, shortness of breath, mood changes.   Objective  Blood pressure 110/74, pulse 72, height 6\' 6"  (1.981 m), weight 247  lb (112 kg), SpO2 97 %. Systems examined below as of 11/07/17   General: No apparent distress alert and oriented x3 mood and affect normal, dressed appropriately.  HEENT: Pupils equal, extraocular movements intact  Respiratory: Patient's speak in full sentences and does not appear short of breath  Cardiovascular: No lower extremity edema, non tender, no erythema  Skin: Warm dry intact with no signs of infection or rash on extremities or on axial skeleton.  Abdomen: Soft nontender  Neuro: Cranial nerves II through XII are intact, neurovascularly intact in all extremities with 2+ DTRs and 2+ pulses.  Lymph: No lymphadenopathy of posterior or anterior cervical chain or axillae bilaterally.  Gait normal with good balance and coordination.  MSK:  Non tender with full range of motion and good stability and symmetric strength and tone of shoulders, elbows, wrist, hip, knee and ankles bilaterally.     Impression  and Recommendations:     This case required medical decision making of moderate complexity.      Note: This dictation was prepared with Dragon dictation along with smaller phrase technology. Any transcriptional errors that result from this process are unintentional.

## 2017-11-07 ENCOUNTER — Encounter: Payer: Self-pay | Admitting: Family Medicine

## 2017-11-07 ENCOUNTER — Ambulatory Visit: Payer: 59 | Admitting: Family Medicine

## 2017-11-07 DIAGNOSIS — M79672 Pain in left foot: Secondary | ICD-10-CM

## 2017-11-07 NOTE — Patient Instructions (Signed)
You are awesome  Keep it up  See me when you need me  [phone]

## 2017-11-07 NOTE — Assessment & Plan Note (Signed)
Significant improvement at this time.  Seem to have more of a calcaneal fracture that is well-healed.  Having minimal discomfort at all.  Patient will follow-up with me as needed

## 2017-12-25 IMAGING — US US SCROTUM
1 series · 14 of 25 positions shown · non-contrast
Comparison: None.

CLINICAL DATA: Left testicular pain for 2 days.  No known injury.

EXAM:
SCROTAL ULTRASOUND
DOPPLER ULTRASOUND OF THE TESTICLES
TECHNIQUE: Complete ultrasound examination of the testicles, epididymis, and
other scrotal structures was performed. Color and spectral Doppler
ultrasound were also utilized to evaluate blood flow to the
testicles.

[Series 1: us scrotum · 0.06mm/px · 14 of 73 slices shown]
[im 1/73]
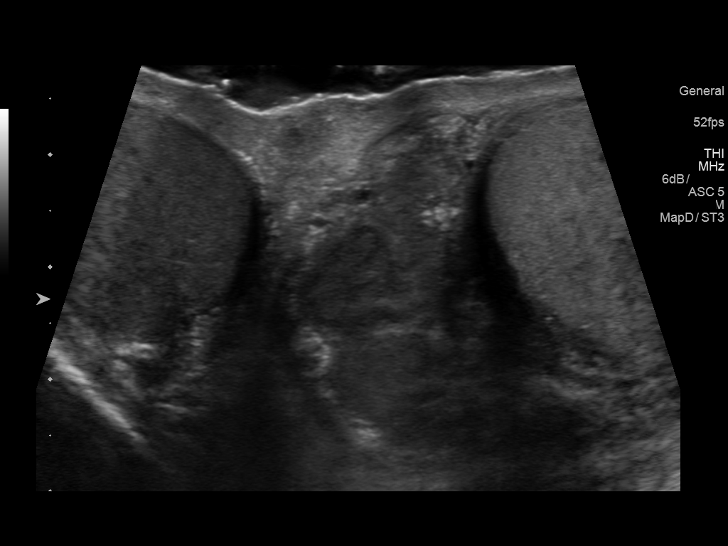
[im 7/73]
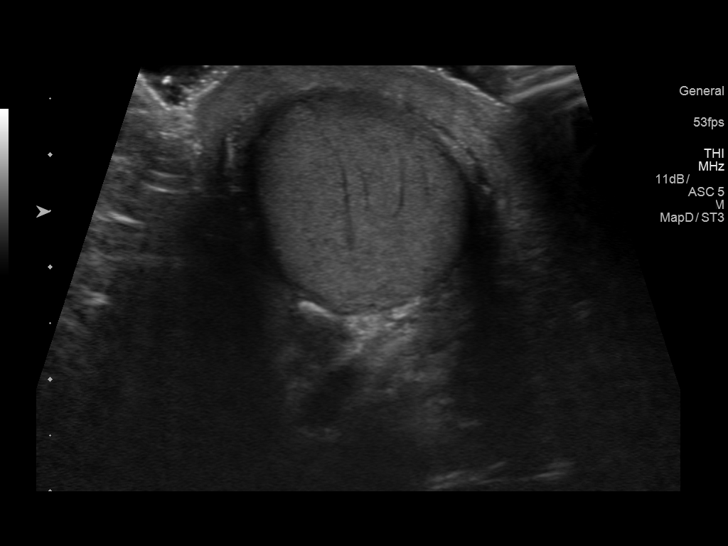
[im 13/73]
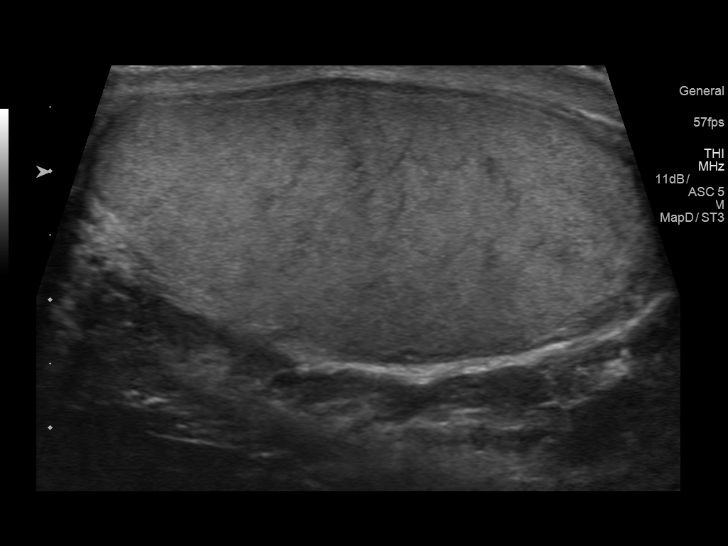
[im 19/73]
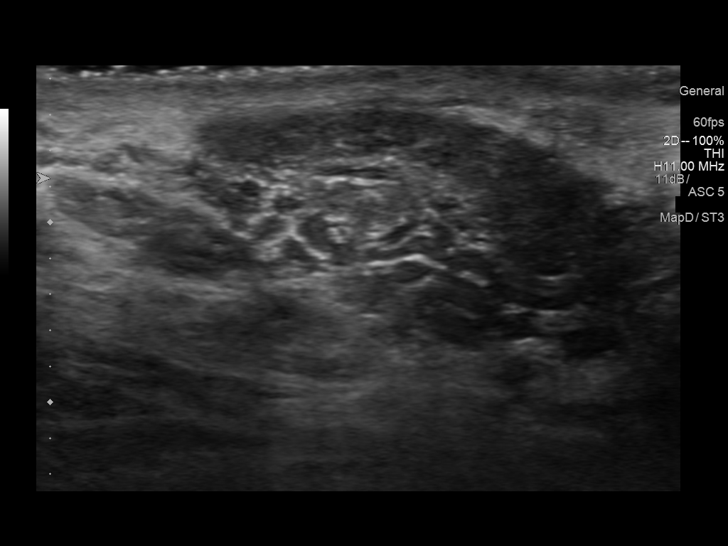
[im 25/73]
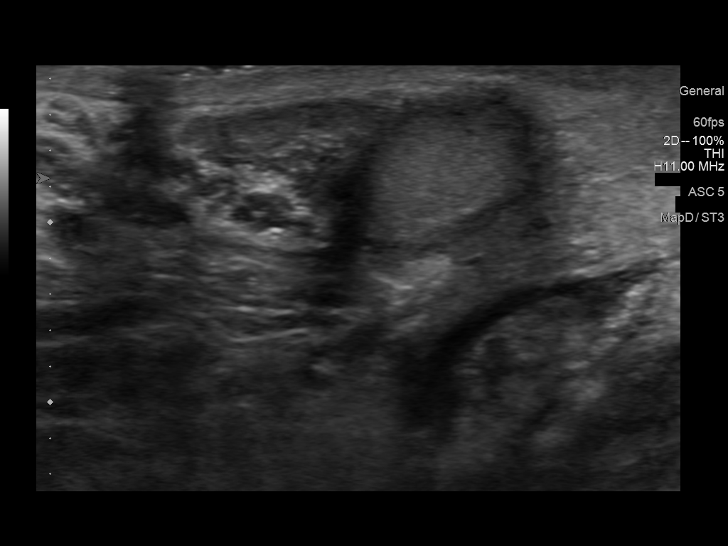
[im 28/73]
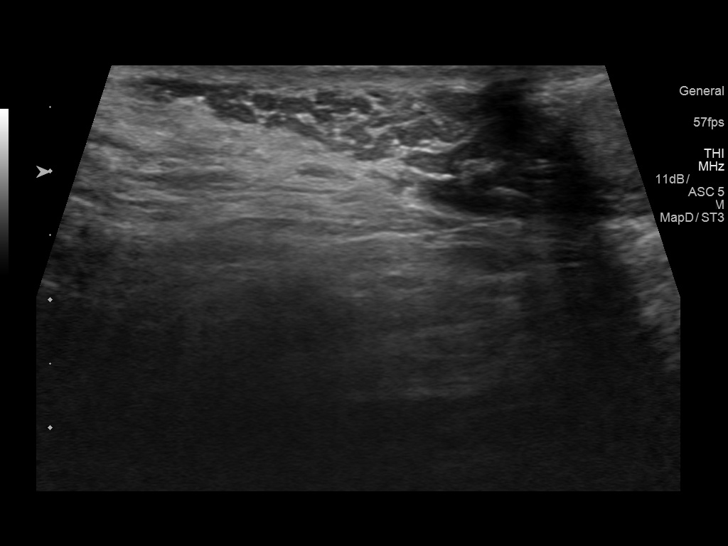
[im 34/73]
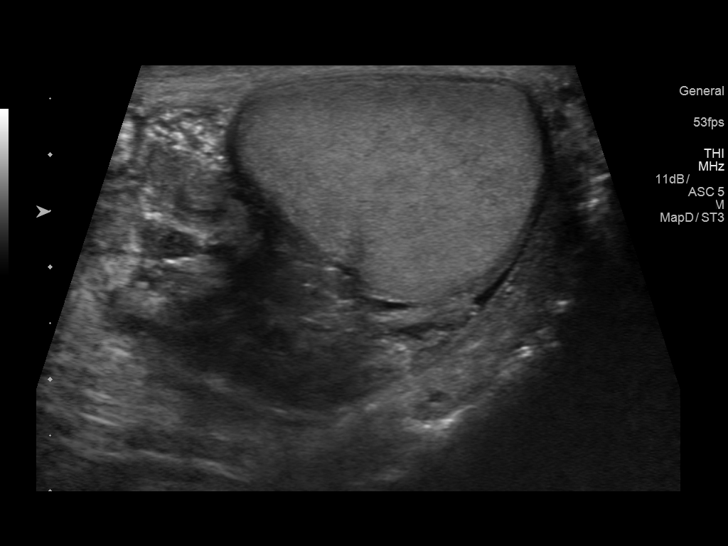
[im 40/73]
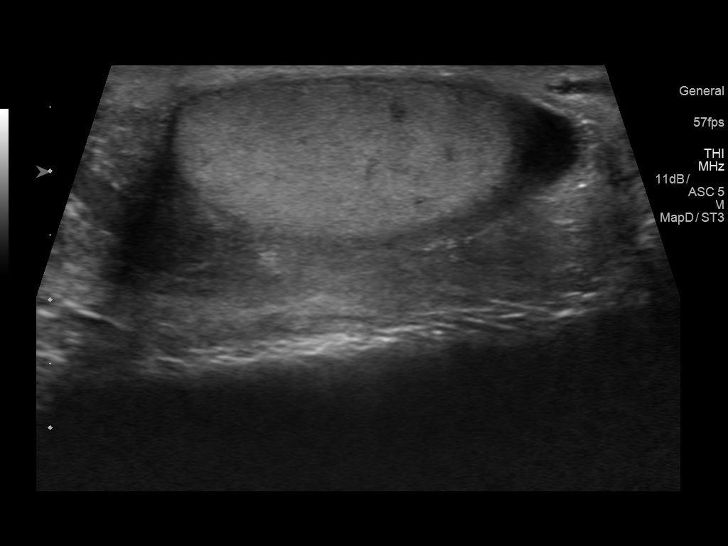
[im 46/73]
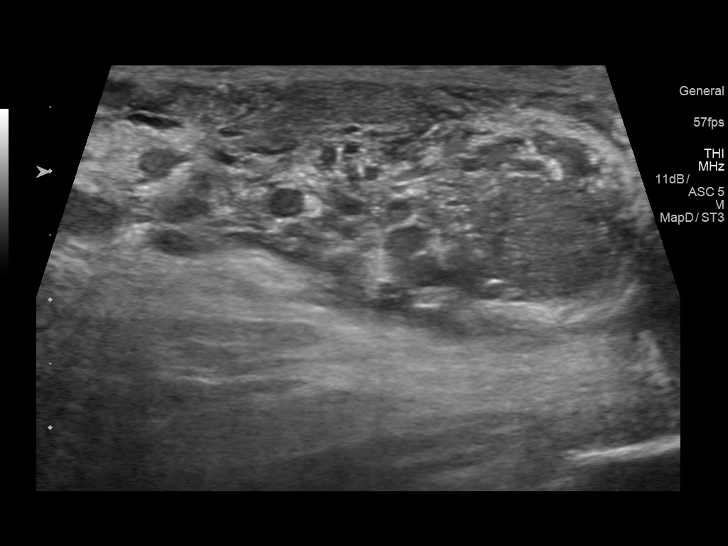
[im 49/73]
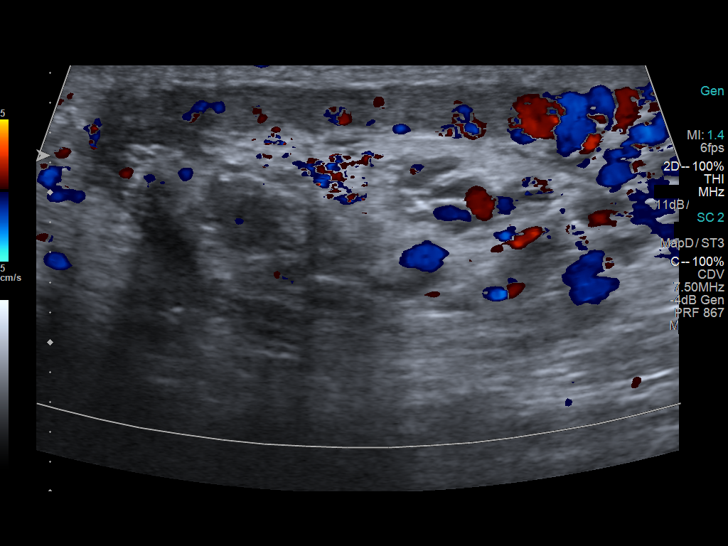
[im 55/73]
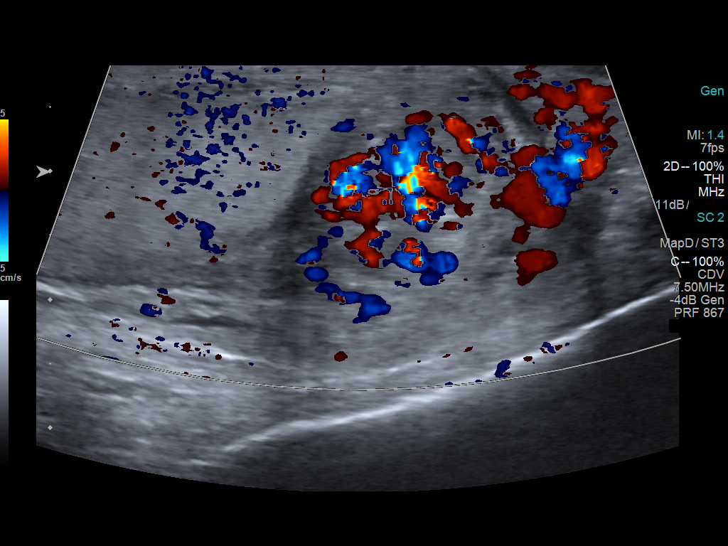
[im 61/73]
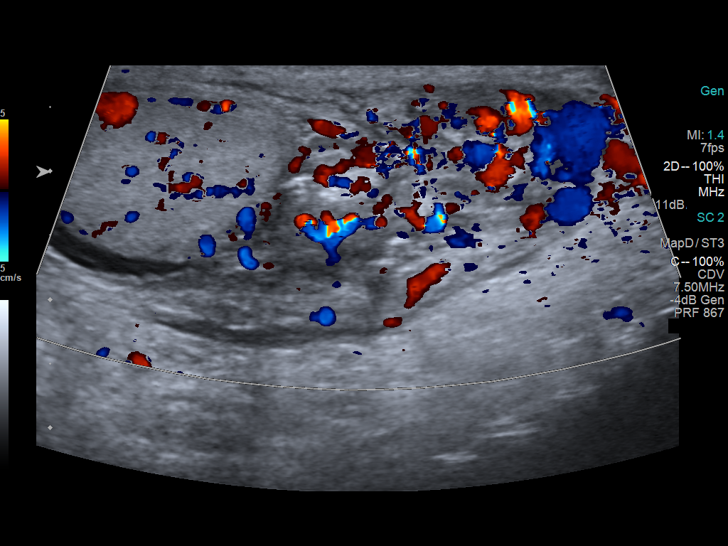
[im 67/73]
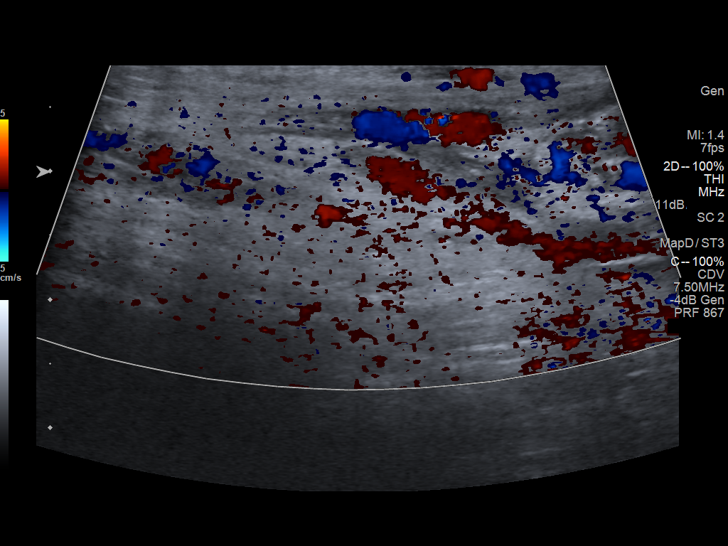
[im 73/73]
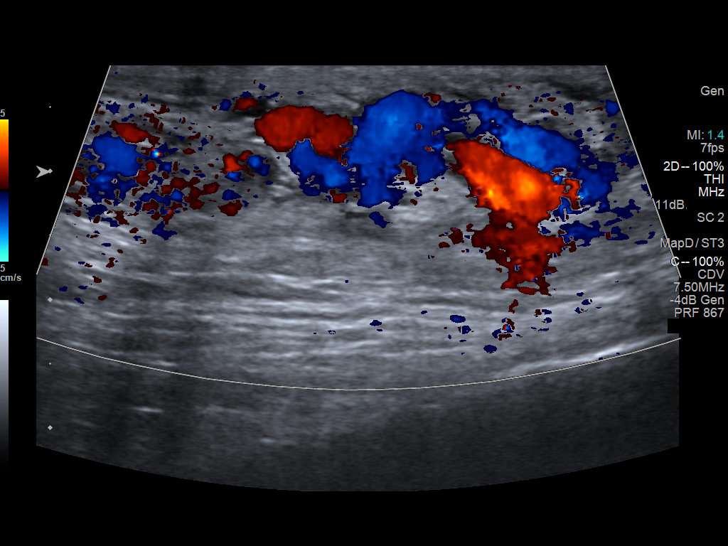

[14 of 25 positions shown; findings below may reference images not displayed]

FINDINGS: Right testicle

Measurements: 4.6 x 2.0 x 2.4 cm. No mass or microlithiasis
visualized.

Left testicle

Measurements: 4.2 x 1.9 x 2.7 cm. No mass or microlithiasis
visualized.

Right epididymis:  Normal in size and appearance.

Left epididymis: Edematous and hypervascular compared to the right
side.

Hydrocele:  Small left hydrocele is noted.

Varicocele:  Varicocele is seen on the left.

Pulsed Doppler interrogation of both testes demonstrates normal low
resistance arterial and venous waveforms bilaterally.
IMPRESSION: Findings consistent with epididymitis on the left with an associated
small left hydrocele.

Left varicocele.

## 2018-01-10 ENCOUNTER — Ambulatory Visit: Payer: Self-pay | Admitting: Internal Medicine

## 2018-01-28 ENCOUNTER — Encounter: Payer: 59 | Admitting: Internal Medicine

## 2018-02-04 ENCOUNTER — Encounter: Payer: Self-pay | Admitting: Internal Medicine

## 2018-02-04 ENCOUNTER — Ambulatory Visit: Payer: 59 | Admitting: Internal Medicine

## 2018-02-04 ENCOUNTER — Other Ambulatory Visit (INDEPENDENT_AMBULATORY_CARE_PROVIDER_SITE_OTHER): Payer: 59

## 2018-02-04 VITALS — BP 122/70 | HR 69 | Temp 98.9°F | Ht 78.0 in | Wt 243.0 lb

## 2018-02-04 DIAGNOSIS — M542 Cervicalgia: Secondary | ICD-10-CM | POA: Diagnosis not present

## 2018-02-04 DIAGNOSIS — I10 Essential (primary) hypertension: Secondary | ICD-10-CM

## 2018-02-04 DIAGNOSIS — Z23 Encounter for immunization: Secondary | ICD-10-CM

## 2018-02-04 DIAGNOSIS — F988 Other specified behavioral and emotional disorders with onset usually occurring in childhood and adolescence: Secondary | ICD-10-CM

## 2018-02-04 DIAGNOSIS — E291 Testicular hypofunction: Secondary | ICD-10-CM | POA: Diagnosis not present

## 2018-02-04 DIAGNOSIS — Z720 Tobacco use: Secondary | ICD-10-CM

## 2018-02-04 LAB — HEPATIC FUNCTION PANEL
ALT: 21 U/L (ref 0–53)
AST: 17 U/L (ref 0–37)
Albumin: 4.3 g/dL (ref 3.5–5.2)
Alkaline Phosphatase: 57 U/L (ref 39–117)
BILIRUBIN DIRECT: 0.1 mg/dL (ref 0.0–0.3)
BILIRUBIN TOTAL: 0.6 mg/dL (ref 0.2–1.2)
Total Protein: 6.8 g/dL (ref 6.0–8.3)

## 2018-02-04 LAB — TESTOSTERONE: Testosterone: 896.3 ng/dL — ABNORMAL HIGH (ref 300.00–890.00)

## 2018-02-04 LAB — BASIC METABOLIC PANEL
BUN: 22 mg/dL (ref 6–23)
CHLORIDE: 102 meq/L (ref 96–112)
CO2: 31 mEq/L (ref 19–32)
CREATININE: 1.19 mg/dL (ref 0.40–1.50)
Calcium: 9.5 mg/dL (ref 8.4–10.5)
GFR: 65.34 mL/min (ref >60.00)
Glucose, Bld: 105 mg/dL — ABNORMAL HIGH (ref 70–99)
Potassium: 4.1 mEq/L (ref 3.5–5.1)
Sodium: 138 mEq/L (ref 135–145)

## 2018-02-04 LAB — CBC WITH DIFFERENTIAL/PLATELET
BASOS PCT: 0.6 % (ref 0.0–3.0)
Basophils Absolute: 0.1 10*3/uL (ref 0.0–0.1)
EOS ABS: 0.1 10*3/uL (ref 0.0–0.7)
Eosinophils Relative: 0.5 % (ref 0.0–5.0)
HCT: 45.1 % (ref 39.0–52.0)
HEMOGLOBIN: 15 g/dL (ref 13.0–17.0)
Lymphocytes Relative: 17 % (ref 12.0–46.0)
Lymphs Abs: 1.7 10*3/uL (ref 0.7–4.0)
MCHC: 33.3 g/dL (ref 30.0–36.0)
MCV: 93.8 fl (ref 78.0–100.0)
MONO ABS: 0.9 10*3/uL (ref 0.1–1.0)
Monocytes Relative: 9.2 % (ref 3.0–12.0)
Neutro Abs: 7.3 10*3/uL (ref 1.4–7.7)
Neutrophils Relative %: 72.7 % (ref 43.0–77.0)
PLATELETS: 342 10*3/uL (ref 150.0–400.0)
RBC: 4.81 Mil/uL (ref 4.22–5.81)
RDW: 13.1 % (ref 11.5–15.5)
WBC: 10.1 10*3/uL (ref 4.0–10.5)

## 2018-02-04 MED ORDER — AMPHETAMINE-DEXTROAMPHET ER 10 MG PO CP24: 20.0000 mg | ORAL_CAPSULE | Freq: Every day | ORAL | 0 refills | Status: DC

## 2018-02-04 MED ORDER — TESTOSTERONE CYPIONATE 200 MG/ML IM SOLN: INTRAMUSCULAR | 3 refills | Status: DC

## 2018-02-04 MED ORDER — NABUMETONE 500 MG PO TABS: 500.0000 mg | ORAL_TABLET | Freq: Every day | ORAL | 3 refills | Status: DC | PRN

## 2018-02-04 NOTE — Assessment & Plan Note (Signed)
Losartan 

## 2018-02-04 NOTE — Assessment & Plan Note (Signed)
Adderal is n/a at the drug stores: change to Adderall XR

## 2018-02-04 NOTE — Assessment & Plan Note (Signed)
Worse Will try Relafen pc

## 2018-02-04 NOTE — Assessment & Plan Note (Signed)
  On testosterone Labs

## 2018-02-04 NOTE — Assessment & Plan Note (Signed)
Quit in 05/2017

## 2018-02-04 NOTE — Assessment & Plan Note (Signed)
Breo prn

## 2018-02-04 NOTE — Progress Notes (Signed)
Subjective:  Patient ID: Victor Montes, male    DOB: 1956/02/25  Age: 62 y.o. MRN: [id number]  CC: No chief complaint on file.   HPI Nazar Kuan presents for ADD, hypogonadism, depression f/u. Adderal is n/a at the drug stores  Outpatient Medications Prior to Visit  Medication Sig Dispense Refill  . albuterol (PROAIR HFA) 108 (90 Base) MCG/ACT inhaler Inhale 2 puffs into the lungs every 6 (six) hours as needed for wheezing or shortness of breath. 3 Inhaler 3  . amphetamine-dextroamphetamine (ADDERALL) 10 MG tablet Take 1 tablet (10 mg total) by mouth 2 (two) times daily. Please fill on or after 12/25/17 60 tablet 0  . aspirin EC 81 MG tablet Take 81 mg by mouth daily.    Marland Kitchen b complex vitamins tablet Take 1 tablet by mouth daily.    . cetirizine (ZYRTEC) 10 MG tablet Take 10 mg by mouth daily.    . cholecalciferol (VITAMIN D) 1000 UNITS tablet Take 1,000 Units by mouth daily.      . Coenzyme Q10 (CO Q 10 PO) Take 1 tablet by mouth daily.    . Diclofenac Sodium (PENNSAID) 2 % SOLN Apply 1 pump twice daily. 112 g 3  . fluticasone furoate-vilanterol (BREO ELLIPTA) 100-25 MCG/INH AEPB Inhale 1 puff into the lungs daily. 1 each 11  . guaiFENesin (MUCINEX) 600 MG 12 hr tablet Take 600 mg by mouth daily with breakfast.    . losartan (COZAAR) 50 MG tablet Take 1 tablet (50 mg total) by mouth daily. 30 tablet 1  . MAGNESIUM PO Take 1 tablet by mouth daily.    . Multiple Vitamin (MULTIVITAMIN WITH MINERALS) TABS tablet Take 1 tablet by mouth daily.    Marland Kitchen olmesartan (BENICAR) 40 MG tablet Take 1 tablet (40 mg total) by mouth daily. 90 tablet 3  . predniSONE (DELTASONE) 50 MG tablet Take 1 tablet (50 mg total) by mouth daily. 5 tablet 0  . Probiotic Product (PROBIOTIC DAILY) CAPS Take 1 capsule by mouth daily.    . sertraline (ZOLOFT) 50 MG tablet TAKE 1 TABLET DAILY 90 tablet 3  . sildenafil (VIAGRA) 100 MG tablet Take 1 tablet (100 mg total) by mouth as needed for erectile dysfunction. 30 tablet 11   . SYRINGE-NEEDLE, DISP, 3 ML (BD ECLIPSE SYRINGE) 25G X 1" 3 ML MISC USE AS DIRECTED FOR IM INJECTION 50 each 11  . testosterone cypionate (DEPOTESTOSTERONE CYPIONATE) 200 MG/ML injection INJECT 2 ML (CC) INTRAMUSCULARLY EVERY 14 DAYS 10 mL 3  . Vitamin D, Ergocalciferol, (DRISDOL) 50000 units CAPS capsule Take 1 capsule (50,000 Units total) by mouth every 7 (seven) days. 12 capsule 0   Facility-Administered Medications Prior to Visit  Medication Dose Route Frequency Provider Last Rate Last Dose  . testosterone cypionate (DEPOTESTOTERONE CYPIONATE) injection 200 mg  200 mg Intramuscular Q14 Days Gilbert Manolis, Evie Lacks, MD   200 mg at 01/18/12 0933    ROS: Review of Systems  Constitutional: Negative for appetite change, fatigue and unexpected weight change.  HENT: Negative for congestion, nosebleeds, sneezing, sore throat and trouble swallowing.   Eyes: Negative for itching and visual disturbance.  Respiratory: Negative for cough.   Cardiovascular: Negative for chest pain, palpitations and leg swelling.  Gastrointestinal: Negative for abdominal distention, blood in stool, diarrhea and nausea.  Genitourinary: Negative for frequency and hematuria.  Musculoskeletal: Negative for back pain, gait problem, joint swelling and neck pain.  Skin: Negative for rash.  Neurological: Negative for dizziness, tremors, speech difficulty and weakness.  Psychiatric/Behavioral: Positive for decreased concentration. Negative for agitation, dysphoric mood, sleep disturbance and suicidal ideas. The patient is not nervous/anxious.     Objective:  BP 122/70 (BP Location: Left Arm, Patient Position: Sitting, Cuff Size: Large)   Pulse 69   Temp 98.9 F (37.2 C) (Oral)   Ht 6\' 6"  (1.981 m)   Wt 243 lb (110.2 kg)   SpO2 96%   BMI 28.08 kg/m   BP Readings from Last 3 Encounters:  02/04/18 122/70  11/07/17 110/74  10/09/17 132/82    Wt Readings from Last 3 Encounters:  02/04/18 243 lb (110.2 kg)  11/07/17  247 lb (112 kg)  10/09/17 248 lb (112.5 kg)    Physical Exam  Constitutional: He is oriented to person, place, and time. He appears well-developed. No distress.  NAD  HENT:  Mouth/Throat: Oropharynx is clear and moist.  Eyes: Pupils are equal, round, and reactive to light. Conjunctivae are normal.  Neck: Normal range of motion. No JVD present. No thyromegaly present.  Cardiovascular: Normal rate, regular rhythm, normal heart sounds and intact distal pulses. Exam reveals no gallop and no friction rub.  No murmur heard. Pulmonary/Chest: Effort normal and breath sounds normal. No respiratory distress. He has no wheezes. He has no rales. He exhibits no tenderness.  Abdominal: Soft. Bowel sounds are normal. He exhibits no distension and no mass. There is no tenderness. There is no rebound and no guarding.  Musculoskeletal: Normal range of motion. He exhibits no edema or tenderness.  Lymphadenopathy:    He has no cervical adenopathy.  Neurological: He is alert and oriented to person, place, and time. He has normal reflexes. No cranial nerve deficit. He exhibits normal muscle tone. He displays a negative Romberg sign. Coordination and gait normal.  Skin: Skin is warm and dry. No rash noted.  Psychiatric: He has a normal mood and affect. His behavior is normal. Judgment and thought content normal.    Lab Results  Component Value Date   WBC 13.5 (H) 07/11/2017   HGB 17.0 07/11/2017   HCT 49.9 07/11/2017   PLT 318.0 07/11/2017   GLUCOSE 105 (H) 07/11/2017   CHOL 202 (H) 07/05/2016   TRIG 228.0 (H) 07/05/2016   HDL 56.70 07/05/2016   LDLDIRECT 118.0 07/05/2016   LDLCALC 122 (H) 11/29/2015   ALT 18 01/08/2017   AST 19 01/08/2017   NA 138 07/11/2017   K 4.4 07/11/2017   CL 101 07/11/2017   CREATININE 1.01 07/11/2017   BUN 14 07/11/2017   CO2 32 07/11/2017   TSH 0.96 07/11/2017   PSA 2.32 01/08/2017   HGBA1C 5.6 07/11/2017    US Scrotum  Result Date: 06/09/2016 CLINICAL DATA:   Left testicular pain for 2 days.  No known injury. EXAM: SCROTAL ULTRASOUND DOPPLER ULTRASOUND OF THE TESTICLES TECHNIQUE: Complete ultrasound examination of the testicles, epididymis, and other scrotal structures was performed. Color and spectral Doppler ultrasound were also utilized to evaluate blood flow to the testicles. COMPARISON:  None. FINDINGS: Right testicle Measurements: 4.6 x 2.0 x 2.4 cm. No mass or microlithiasis visualized. Left testicle Measurements: 4.2 x 1.9 x 2.7 cm. No mass or microlithiasis visualized. Right epididymis:  Normal in size and appearance. Left epididymis: Edematous and hypervascular compared to the right side. Hydrocele:  Small left hydrocele is noted. Varicocele:  Varicocele is seen on the left. Pulsed Doppler interrogation of both testes demonstrates normal low resistance arterial and venous waveforms bilaterally. IMPRESSION: Findings consistent with epididymitis on the left with an  associated small left hydrocele. Left varicocele. Electronically Signed   By: Inge Rise M.D.   On: 06/09/2016 11:02   [place] Art/ven Flow Abd Pelv Doppler  Result Date: 06/09/2016 CLINICAL DATA:  Left testicular pain for 2 days.  No known injury. EXAM: SCROTAL ULTRASOUND DOPPLER ULTRASOUND OF THE TESTICLES TECHNIQUE: Complete ultrasound examination of the testicles, epididymis, and other scrotal structures was performed. Color and spectral Doppler ultrasound were also utilized to evaluate blood flow to the testicles. COMPARISON:  None. FINDINGS: Right testicle Measurements: 4.6 x 2.0 x 2.4 cm. No mass or microlithiasis visualized. Left testicle Measurements: 4.2 x 1.9 x 2.7 cm. No mass or microlithiasis visualized. Right epididymis:  Normal in size and appearance. Left epididymis: Edematous and hypervascular compared to the right side. Hydrocele:  Small left hydrocele is noted. Varicocele:  Varicocele is seen on the left. Pulsed Doppler interrogation of both testes demonstrates normal low  resistance arterial and venous waveforms bilaterally. IMPRESSION: Findings consistent with epididymitis on the left with an associated small left hydrocele. Left varicocele. Electronically Signed   By: Inge Rise M.D.   On: 06/09/2016 11:02    Assessment & Plan:   There are no diagnoses linked to this encounter.   No orders of the defined types were placed in this encounter.    Follow-up: No follow-ups on file.  Walker Kehr, MD

## 2018-02-05 LAB — PSA, TOTAL AND FREE
PSA, % Free: 14 % (calc) — ABNORMAL LOW (ref >25)
PSA, Free: 0.3 ng/mL
PSA, Total: 2.1 ng/mL (ref <=4.0)

## 2018-04-14 ENCOUNTER — Other Ambulatory Visit: Payer: Self-pay | Admitting: Internal Medicine

## 2018-05-07 ENCOUNTER — Ambulatory Visit: Payer: Self-pay | Admitting: Internal Medicine

## 2018-05-20 ENCOUNTER — Encounter: Payer: Self-pay | Admitting: Internal Medicine

## 2018-05-20 ENCOUNTER — Ambulatory Visit: Payer: 59 | Admitting: Internal Medicine

## 2018-05-20 VITALS — BP 116/64 | HR 76 | Temp 98.4°F | Ht 78.0 in | Wt 231.0 lb

## 2018-05-20 DIAGNOSIS — I10 Essential (primary) hypertension: Secondary | ICD-10-CM | POA: Diagnosis not present

## 2018-05-20 DIAGNOSIS — F988 Other specified behavioral and emotional disorders with onset usually occurring in childhood and adolescence: Secondary | ICD-10-CM

## 2018-05-20 DIAGNOSIS — E785 Hyperlipidemia, unspecified: Secondary | ICD-10-CM

## 2018-05-20 DIAGNOSIS — E291 Testicular hypofunction: Secondary | ICD-10-CM

## 2018-05-20 DIAGNOSIS — M542 Cervicalgia: Secondary | ICD-10-CM | POA: Diagnosis not present

## 2018-05-20 MED ORDER — METHYLPREDNISOLONE ACETATE 40 MG/ML IJ SUSP
40.0000 mg | Freq: Once | INTRAMUSCULAR | Status: AC
  Administered 2018-05-20: 40 mg via INTRA_ARTICULAR

## 2018-05-20 MED ORDER — "SYRINGE/NEEDLE (DISP) 25G X 1"" 3 ML MISC": 11 refills | Status: DC

## 2018-05-20 MED ORDER — AMPHETAMINE-DEXTROAMPHETAMINE 10 MG PO TABS: 10.0000 mg | ORAL_TABLET | Freq: Two times a day (BID) | ORAL | 0 refills | Status: DC

## 2018-05-20 NOTE — Progress Notes (Signed)
Subjective:  Patient ID: Victor Montes, male    DOB: 07/05/1955  Age: 62 y.o. MRN: [id number]  CC: No chief complaint on file.   HPI Leanard Dimaio presents for ADD, neck pain - not better, allergies  Outpatient Medications Prior to Visit  Medication Sig Dispense Refill  . albuterol (PROAIR HFA) 108 (90 Base) MCG/ACT inhaler Inhale 2 puffs into the lungs every 6 (six) hours as needed for wheezing or shortness of breath. 3 Inhaler 3  . amphetamine-dextroamphetamine (ADDERALL XR) 10 MG 24 hr capsule Take 2 capsules (20 mg total) by mouth daily. 60 capsule 0  . aspirin EC 81 MG tablet Take 81 mg by mouth daily.    Marland Kitchen b complex vitamins tablet Take 1 tablet by mouth daily.    . cetirizine (ZYRTEC) 10 MG tablet Take 10 mg by mouth daily.    . cholecalciferol (VITAMIN D) 1000 UNITS tablet Take 1,000 Units by mouth daily.      . Coenzyme Q10 (CO Q 10 PO) Take 1 tablet by mouth daily.    . Diclofenac Sodium (PENNSAID) 2 % SOLN Apply 1 pump twice daily. 112 g 3  . fluticasone furoate-vilanterol (BREO ELLIPTA) 100-25 MCG/INH AEPB Inhale 1 puff into the lungs daily. 1 each 11  . guaiFENesin (MUCINEX) 600 MG 12 hr tablet Take 600 mg by mouth daily with breakfast.    . losartan (COZAAR) 50 MG tablet Take 1 tablet (50 mg total) by mouth daily. 30 tablet 1  . MAGNESIUM PO Take 1 tablet by mouth daily.    . Multiple Vitamin (MULTIVITAMIN WITH MINERALS) TABS tablet Take 1 tablet by mouth daily.    . nabumetone (RELAFEN) 500 MG tablet Take 1-2 tablets (500-1,000 mg total) by mouth daily as needed. 60 tablet 3  . olmesartan (BENICAR) 40 MG tablet Take 1 tablet (40 mg total) by mouth daily. 90 tablet 3  . predniSONE (DELTASONE) 50 MG tablet Take 1 tablet (50 mg total) by mouth daily. 5 tablet 0  . Probiotic Product (PROBIOTIC DAILY) CAPS Take 1 capsule by mouth daily.    . sertraline (ZOLOFT) 50 MG tablet TAKE 1 TABLET DAILY 90 tablet 3  . sildenafil (VIAGRA) 100 MG tablet Take 1 tablet (100 mg total) by  mouth as needed for erectile dysfunction. 30 tablet 11  . SYRINGE-NEEDLE, DISP, 3 ML (BD ECLIPSE SYRINGE) 25G X 1" 3 ML MISC USE AS DIRECTED FOR IM INJECTION 50 each 11  . testosterone cypionate (DEPOTESTOSTERONE CYPIONATE) 200 MG/ML injection INJECT 2 ML (CC) INTRAMUSCULARLY EVERY 14 DAYS 10 mL 3  . Vitamin D, Ergocalciferol, (DRISDOL) 50000 units CAPS capsule Take 1 capsule (50,000 Units total) by mouth every 7 (seven) days. 12 capsule 0   Facility-Administered Medications Prior to Visit  Medication Dose Route Frequency Provider Last Rate Last Dose  . testosterone cypionate (DEPOTESTOTERONE CYPIONATE) injection 200 mg  200 mg Intramuscular Q14 Days Art Levan, Evie Lacks, MD   200 mg at 01/18/12 0933    ROS: Review of Systems  Constitutional: Negative for appetite change, fatigue and unexpected weight change.  HENT: Negative for congestion, nosebleeds, sneezing, sore throat and trouble swallowing.   Eyes: Negative for itching and visual disturbance.  Respiratory: Negative for cough.   Cardiovascular: Negative for chest pain, palpitations and leg swelling.  Gastrointestinal: Negative for abdominal distention, blood in stool, diarrhea and nausea.  Genitourinary: Negative for frequency and hematuria.  Musculoskeletal: Negative for back pain, gait problem, joint swelling and neck pain.  Skin: Negative for rash.  Neurological: Negative for dizziness, tremors, speech difficulty and weakness.  Psychiatric/Behavioral: Negative for agitation, dysphoric mood, sleep disturbance and suicidal ideas. The patient is not nervous/anxious.     Objective:  BP 116/64 (BP Location: Left Arm, Patient Position: Sitting, Cuff Size: Large)   Pulse 76   Temp 98.4 F (36.9 C) (Oral)   Ht 6\' 6"  (1.981 m)   Wt 231 lb (104.8 kg)   SpO2 97%   BMI 26.69 kg/m   BP Readings from Last 3 Encounters:  05/20/18 116/64  02/04/18 122/70  11/07/17 110/74    Wt Readings from Last 3 Encounters:  05/20/18 231 lb  (104.8 kg)  02/04/18 243 lb (110.2 kg)  11/07/17 247 lb (112 kg)    Physical Exam Constitutional:      General: He is not in acute distress.    Appearance: He is well-developed.     Comments: NAD  Eyes:     Conjunctiva/sclera: Conjunctivae normal.     Pupils: Pupils are equal, round, and reactive to light.  Neck:     Musculoskeletal: Normal range of motion.     Thyroid: No thyromegaly.     Vascular: No JVD.  Cardiovascular:     Rate and Rhythm: Normal rate and regular rhythm.     Heart sounds: Normal heart sounds. No murmur. No friction rub. No gallop.   Pulmonary:     Effort: Pulmonary effort is normal. No respiratory distress.     Breath sounds: Normal breath sounds. No wheezing or rales.  Chest:     Chest wall: No tenderness.  Abdominal:     General: Bowel sounds are normal. There is no distension.     Palpations: Abdomen is soft. There is no mass.     Tenderness: There is no abdominal tenderness. There is no guarding or rebound.  Musculoskeletal: Normal range of motion.        General: No tenderness.  Lymphadenopathy:     Cervical: No cervical adenopathy.  Skin:    General: Skin is warm and dry.     Findings: No rash.  Neurological:     Mental Status: He is alert and oriented to person, place, and time.     Cranial Nerves: No cranial nerve deficit.     Motor: No abnormal muscle tone.     Coordination: Coordination normal.     Gait: Gait normal.     Deep Tendon Reflexes: Reflexes are normal and symmetric.  Psychiatric:        Behavior: Behavior normal.        Thought Content: Thought content normal.        Judgment: Judgment normal.   neck, traps tender   Procedure Note :    Trigger Point Injection:   Indication : Focal tender area identifiable by the location without other identifiable neurologic or musculoskeletal finding or pathology.   Risks including unsuccessful procedure , bleeding, infection, bruising, skin atrophy and others were explained to the  patient in detail as well as the benefits. Informed consent was obtained and signed.   Tthe patient was placed in a comfortable position. 4   points of maximum tenderness over paraspinal and trapezius muscles were marked and  the skin was prepped with Betadine and alcohol. 1 inch 25-gauge needle was used. The needle was advanced perpendicular to the skin. Each trigger point was injected with 1 mL of 2% lidocaine and 10 mg of Depo-Medrol in a usual fashion.  Band-Aids applied.   Tolerated well. Complications: None.  Good pain relief following the procedure.   Lab Results  Component Value Date   WBC 10.1 02/04/2018   HGB 15.0 02/04/2018   HCT 45.1 02/04/2018   PLT 342.0 02/04/2018   GLUCOSE 105 (H) 02/04/2018   CHOL 202 (H) 07/05/2016   TRIG 228.0 (H) 07/05/2016   HDL 56.70 07/05/2016   LDLDIRECT 118.0 07/05/2016   LDLCALC 122 (H) 11/29/2015   ALT 21 02/04/2018   AST 17 02/04/2018   NA 138 02/04/2018   K 4.1 02/04/2018   CL 102 02/04/2018   CREATININE 1.19 02/04/2018   BUN 22 02/04/2018   CO2 31 02/04/2018   TSH 0.96 07/11/2017   PSA 2.32 01/08/2017   HGBA1C 5.6 07/11/2017    US Scrotum  Result Date: 06/09/2016 CLINICAL DATA:  Left testicular pain for 2 days.  No known injury. EXAM: SCROTAL ULTRASOUND DOPPLER ULTRASOUND OF THE TESTICLES TECHNIQUE: Complete ultrasound examination of the testicles, epididymis, and other scrotal structures was performed. Color and spectral Doppler ultrasound were also utilized to evaluate blood flow to the testicles. COMPARISON:  None. FINDINGS: Right testicle Measurements: 4.6 x 2.0 x 2.4 cm. No mass or microlithiasis visualized. Left testicle Measurements: 4.2 x 1.9 x 2.7 cm. No mass or microlithiasis visualized. Right epididymis:  Normal in size and appearance. Left epididymis: Edematous and hypervascular compared to the right side. Hydrocele:  Small left hydrocele is noted. Varicocele:  Varicocele is seen on the left. Pulsed Doppler interrogation of  both testes demonstrates normal low resistance arterial and venous waveforms bilaterally. IMPRESSION: Findings consistent with epididymitis on the left with an associated small left hydrocele. Left varicocele. Electronically Signed   By: Inge Rise M.D.   On: 06/09/2016 11:02   [place] Art/ven Flow Abd Pelv Doppler  Result Date: 06/09/2016 CLINICAL DATA:  Left testicular pain for 2 days.  No known injury. EXAM: SCROTAL ULTRASOUND DOPPLER ULTRASOUND OF THE TESTICLES TECHNIQUE: Complete ultrasound examination of the testicles, epididymis, and other scrotal structures was performed. Color and spectral Doppler ultrasound were also utilized to evaluate blood flow to the testicles. COMPARISON:  None. FINDINGS: Right testicle Measurements: 4.6 x 2.0 x 2.4 cm. No mass or microlithiasis visualized. Left testicle Measurements: 4.2 x 1.9 x 2.7 cm. No mass or microlithiasis visualized. Right epididymis:  Normal in size and appearance. Left epididymis: Edematous and hypervascular compared to the right side. Hydrocele:  Small left hydrocele is noted. Varicocele:  Varicocele is seen on the left. Pulsed Doppler interrogation of both testes demonstrates normal low resistance arterial and venous waveforms bilaterally. IMPRESSION: Findings consistent with epididymitis on the left with an associated small left hydrocele. Left varicocele. Electronically Signed   By: Inge Rise M.D.   On: 06/09/2016 11:02    Assessment & Plan:   There are no diagnoses linked to this encounter.   No orders of the defined types were placed in this encounter.    Follow-up: No follow-ups on file.  Walker Kehr, MD

## 2018-05-20 NOTE — Addendum Note (Signed)
Addended by: Karren Cobble on: 05/20/2018 02:29 PM   Modules accepted: Orders

## 2018-05-20 NOTE — Patient Instructions (Signed)

## 2018-05-20 NOTE — Assessment & Plan Note (Signed)
Adderall.  9/19 Adderal is n/a at the drug stores: change to Adderall XR - d/c in 12/19  Potential benefits of a long term stimulants use as well as potential risks  and complications were explained to the patient and were aknowledged.

## 2018-05-20 NOTE — Assessment & Plan Note (Addendum)
On Testosterone Offered cardiac CT scan for calcium scoring test

## 2018-05-20 NOTE — Assessment & Plan Note (Addendum)
Losartan Offered cardiac CT scan for calcium scoring test

## 2018-05-20 NOTE — Addendum Note (Signed)
Addended by: Karren Cobble on: 05/20/2018 01:18 PM   Modules accepted: Orders

## 2018-05-20 NOTE — Assessment & Plan Note (Signed)
Trigger point inj x4

## 2018-06-03 ENCOUNTER — Encounter: Payer: Self-pay | Admitting: Internal Medicine

## 2018-06-18 ENCOUNTER — Ambulatory Visit (INDEPENDENT_AMBULATORY_CARE_PROVIDER_SITE_OTHER)
Admission: RE | Admit: 2018-06-18 | Discharge: 2018-06-18 | Disposition: A | Discharge status: Home / Self Care | Payer: 59 | Source: Ambulatory Visit | Attending: Internal Medicine | Admitting: Internal Medicine

## 2018-06-18 DIAGNOSIS — E785 Hyperlipidemia, unspecified: Secondary | ICD-10-CM

## 2018-06-18 DIAGNOSIS — I10 Essential (primary) hypertension: Secondary | ICD-10-CM

## 2018-06-26 ENCOUNTER — Other Ambulatory Visit: Payer: Self-pay | Admitting: Internal Medicine

## 2018-08-20 ENCOUNTER — Ambulatory Visit (INDEPENDENT_AMBULATORY_CARE_PROVIDER_SITE_OTHER): Payer: 59 | Admitting: Internal Medicine

## 2018-08-20 ENCOUNTER — Encounter: Payer: Self-pay | Admitting: Internal Medicine

## 2018-08-20 DIAGNOSIS — E785 Hyperlipidemia, unspecified: Secondary | ICD-10-CM

## 2018-08-20 DIAGNOSIS — E291 Testicular hypofunction: Secondary | ICD-10-CM

## 2018-08-20 DIAGNOSIS — F988 Other specified behavioral and emotional disorders with onset usually occurring in childhood and adolescence: Secondary | ICD-10-CM

## 2018-08-20 DIAGNOSIS — Z0000 Encounter for general adult medical examination without abnormal findings: Secondary | ICD-10-CM

## 2018-08-20 DIAGNOSIS — I10 Essential (primary) hypertension: Secondary | ICD-10-CM

## 2018-08-20 MED ORDER — AMPHETAMINE-DEXTROAMPHETAMINE 10 MG PO TABS: 10.0000 mg | ORAL_TABLET | Freq: Two times a day (BID) | ORAL | 0 refills | Status: DC

## 2018-08-20 MED ORDER — "SYRINGE/NEEDLE (DISP) 25G X 1"" 3 ML MISC": 11 refills | Status: AC

## 2018-08-20 MED ORDER — TESTOSTERONE CYPIONATE 200 MG/ML IM SOLN: INTRAMUSCULAR | 3 refills | Status: DC

## 2018-08-20 NOTE — Assessment & Plan Note (Signed)
On testosterone 

## 2018-08-20 NOTE — Assessment & Plan Note (Signed)
Losartan 

## 2018-08-20 NOTE — Progress Notes (Signed)
Virtual Visit via Telephone Note  I connected with Victor Montes on 08/20/18 at  3:00 PM EDT by telephone and verified that I am speaking with the correct person using two identifiers.   I discussed the limitations, risks, security and privacy concerns of performing an evaluation and management service by telephone and the availability of in person appointments. I also discussed with the patient that there may be a patient responsible charge related to this service. The patient expressed understanding and agreed to proceed.   History of Present Illness:  F/u on  ADD, hypogonadism, hypertension. Observations/Objective:  [name] looks well  Assessment and Plan:  See plan Follow Up Instructions:    I discussed the assessment and treatment plan with the patient. The patient was provided an opportunity to ask questions and all were answered. The patient agreed with the plan and demonstrated an understanding of the instructions.   The patient was advised to call back or seek an in-person evaluation if the symptoms worsen or if the condition fails to improve as anticipated.  I provided 20 minutes of non-face-to-face time during this encounter.   Walker Kehr, MD

## 2018-08-20 NOTE — Assessment & Plan Note (Signed)
Adderall Potential benefits of a long term stimulants use as well as potential risks  and complications were explained to the patient and were aknowledged. 

## 2018-08-20 NOTE — Assessment & Plan Note (Signed)
Cardiac CT calcium score is 0

## 2018-12-10 ENCOUNTER — Encounter: Payer: Self-pay | Admitting: Internal Medicine

## 2018-12-10 ENCOUNTER — Other Ambulatory Visit: Payer: Self-pay

## 2018-12-10 ENCOUNTER — Ambulatory Visit (INDEPENDENT_AMBULATORY_CARE_PROVIDER_SITE_OTHER): Payer: 59 | Admitting: Internal Medicine

## 2018-12-10 DIAGNOSIS — R202 Paresthesia of skin: Secondary | ICD-10-CM

## 2018-12-10 DIAGNOSIS — Z720 Tobacco use: Secondary | ICD-10-CM

## 2018-12-10 DIAGNOSIS — F988 Other specified behavioral and emotional disorders with onset usually occurring in childhood and adolescence: Secondary | ICD-10-CM

## 2018-12-10 DIAGNOSIS — L819 Disorder of pigmentation, unspecified: Secondary | ICD-10-CM

## 2018-12-10 DIAGNOSIS — E291 Testicular hypofunction: Secondary | ICD-10-CM

## 2018-12-10 DIAGNOSIS — J4521 Mild intermittent asthma with (acute) exacerbation: Secondary | ICD-10-CM | POA: Diagnosis not present

## 2018-12-10 MED ORDER — ALBUTEROL SULFATE HFA 108 (90 BASE) MCG/ACT IN AERS: 2.0000 | INHALATION_SPRAY | Freq: Four times a day (QID) | RESPIRATORY_TRACT | 5 refills | Status: DC | PRN

## 2018-12-10 MED ORDER — BREO ELLIPTA 100-25 MCG/INH IN AEPB: 1.0000 | INHALATION_SPRAY | Freq: Every day | RESPIRATORY_TRACT | 3 refills | Status: DC

## 2018-12-10 MED ORDER — AMPHETAMINE-DEXTROAMPHETAMINE 10 MG PO TABS: 10.0000 mg | ORAL_TABLET | Freq: Two times a day (BID) | ORAL | 0 refills | Status: DC

## 2018-12-10 NOTE — Assessment & Plan Note (Signed)
Discussed.

## 2018-12-10 NOTE — Assessment & Plan Note (Signed)
Breo

## 2018-12-10 NOTE — Assessment & Plan Note (Signed)
Adderall Rx x 3

## 2018-12-10 NOTE — Assessment & Plan Note (Signed)
Check B12 Vit B complex

## 2018-12-10 NOTE — Assessment & Plan Note (Signed)
spider veins B feet - a lot Purplish discoloration B dorsally on feet Compression socks

## 2018-12-10 NOTE — Progress Notes (Signed)
Subjective:  Patient ID: Victor Montes, male    DOB: 05-17-1956  Age: 63 y.o. MRN: [id number]  CC: No chief complaint on file.   HPI Victor Montes presents for L dorsal foot purple discoloration x 6 mo C/o  L plantar numbness x 1-2 weeks F/u ADD, asthma      D    Outpatient Medications Prior to Visit  Medication Sig Dispense Refill  . albuterol (PROAIR HFA) 108 (90 Base) MCG/ACT inhaler Inhale 2 puffs into the lungs every 6 (six) hours as needed for wheezing or shortness of breath. 3 Inhaler 3  . amphetamine-dextroamphetamine (ADDERALL) 10 MG tablet Take 1 tablet (10 mg total) by mouth 2 (two) times daily. 60 tablet 0  . amphetamine-dextroamphetamine (ADDERALL) 10 MG tablet Take 1 tablet (10 mg total) by mouth 2 (two) times daily. 60 tablet 0  . amphetamine-dextroamphetamine (ADDERALL) 10 MG tablet Take 1 tablet (10 mg total) by mouth 2 (two) times daily. 60 tablet 0  . aspirin EC 81 MG tablet Take 81 mg by mouth daily.    Marland Kitchen b complex vitamins tablet Take 1 tablet by mouth daily.    . cetirizine (ZYRTEC) 10 MG tablet Take 10 mg by mouth daily.    . cholecalciferol (VITAMIN D) 1000 UNITS tablet Take 1,000 Units by mouth daily.      . Coenzyme Q10 (CO Q 10 PO) Take 1 tablet by mouth daily.    . Diclofenac Sodium (PENNSAID) 2 % SOLN Apply 1 pump twice daily. 112 g 3  . fluticasone furoate-vilanterol (BREO ELLIPTA) 100-25 MCG/INH AEPB Inhale 1 puff into the lungs daily. 1 each 11  . guaiFENesin (MUCINEX) 600 MG 12 hr tablet Take 600 mg by mouth daily with breakfast.    . MAGNESIUM PO Take 1 tablet by mouth daily.    . Multiple Vitamin (MULTIVITAMIN WITH MINERALS) TABS tablet Take 1 tablet by mouth daily.    . nabumetone (RELAFEN) 500 MG tablet Take 1-2 tablets (500-1,000 mg total) by mouth daily as needed. 60 tablet 3  . predniSONE (DELTASONE) 50 MG tablet Take 1 tablet (50 mg total) by mouth daily. 5 tablet 0  . Probiotic Product (PROBIOTIC DAILY) CAPS Take 1 capsule by mouth  daily.    . sertraline (ZOLOFT) 50 MG tablet TAKE 1 TABLET DAILY 90 tablet 3  . sildenafil (VIAGRA) 100 MG tablet Take 1 tablet (100 mg total) by mouth as needed for erectile dysfunction. 30 tablet 11  . SYRINGE-NEEDLE, DISP, 3 ML (BD ECLIPSE SYRINGE) 25G X 1" 3 ML MISC USE AS DIRECTED FOR IM INJECTION 50 each 11  . testosterone cypionate (DEPOTESTOSTERONE CYPIONATE) 200 MG/ML injection INJECT 2 ML (CC) INTRAMUSCULARLY EVERY 14 DAYS 10 mL 3  . Vitamin D, Ergocalciferol, (DRISDOL) 50000 units CAPS capsule Take 1 capsule (50,000 Units total) by mouth every 7 (seven) days. 12 capsule 0  . olmesartan (BENICAR) 40 MG tablet TAKE 1 TABLET DAILY (Patient not taking: Reported on 12/10/2018) 90 tablet 3  . losartan (COZAAR) 50 MG tablet Take 1 tablet (50 mg total) by mouth daily. (Patient not taking: Reported on 12/10/2018) 30 tablet 1   Facility-Administered Medications Prior to Visit  Medication Dose Route Frequency Provider Last Rate Last Dose  . testosterone cypionate (DEPOTESTOTERONE CYPIONATE) injection 200 mg  200 mg Intramuscular Q14 Days Plotnikov, Evie Lacks, MD   200 mg at 01/18/12 0933    ROS: Review of Systems  Constitutional: Negative for appetite change, fatigue and unexpected weight change.  HENT: Negative  for congestion, nosebleeds, sneezing, sore throat and trouble swallowing.   Eyes: Negative for itching and visual disturbance.  Respiratory: Negative for cough.   Cardiovascular: Negative for chest pain, palpitations and leg swelling.  Gastrointestinal: Negative for abdominal distention, blood in stool, diarrhea and nausea.  Genitourinary: Negative for frequency and hematuria.  Musculoskeletal: Negative for back pain, gait problem, joint swelling and neck pain.  Skin: Positive for color change. Negative for rash.  Neurological: Negative for dizziness, tremors, speech difficulty and weakness.  Psychiatric/Behavioral: Negative for agitation, dysphoric mood and sleep disturbance. The  patient is not nervous/anxious.     Objective:  BP 112/76 (BP Location: Left Arm, Patient Position: Sitting, Cuff Size: Large)   Pulse 67   Temp 98.3 F (36.8 C) (Oral)   Ht 6\' 6"  (1.981 m)   Wt 211 lb (95.7 kg)   SpO2 97%   BMI 24.38 kg/m   BP Readings from Last 3 Encounters:  12/10/18 112/76  05/20/18 116/64  02/04/18 122/70    Wt Readings from Last 3 Encounters:  12/10/18 211 lb (95.7 kg)  05/20/18 231 lb (104.8 kg)  02/04/18 243 lb (110.2 kg)    Physical Exam Constitutional:      General: He is not in acute distress.    Appearance: He is well-developed.     Comments: NAD  Eyes:     Conjunctiva/sclera: Conjunctivae normal.     Pupils: Pupils are equal, round, and reactive to light.  Neck:     Musculoskeletal: Normal range of motion.     Thyroid: No thyromegaly.     Vascular: No JVD.  Cardiovascular:     Rate and Rhythm: Normal rate and regular rhythm.     Heart sounds: Normal heart sounds. No murmur. No friction rub. No gallop.   Pulmonary:     Effort: Pulmonary effort is normal. No respiratory distress.     Breath sounds: Normal breath sounds. No wheezing or rales.  Chest:     Chest wall: No tenderness.  Abdominal:     General: Bowel sounds are normal. There is no distension.     Palpations: Abdomen is soft. There is no mass.     Tenderness: There is no abdominal tenderness. There is no guarding or rebound.  Musculoskeletal: Normal range of motion.        General: No tenderness.  Lymphadenopathy:     Cervical: No cervical adenopathy.  Skin:    General: Skin is warm and dry.     Findings: No rash.  Neurological:     Mental Status: He is alert and oriented to person, place, and time.     Cranial Nerves: No cranial nerve deficit.     Motor: No abnormal muscle tone.     Coordination: Coordination normal.     Gait: Gait normal.     Deep Tendon Reflexes: Reflexes are normal and symmetric.  Psychiatric:        Behavior: Behavior normal.        Thought  Content: Thought content normal.        Judgment: Judgment normal.   spider veins B feet - a lot Purplish discoloration B dorsally on feet, NT No edema  Lab Results  Component Value Date   WBC 10.1 02/04/2018   HGB 15.0 02/04/2018   HCT 45.1 02/04/2018   PLT 342.0 02/04/2018   GLUCOSE 105 (H) 02/04/2018   CHOL 202 (H) 07/05/2016   TRIG 228.0 (H) 07/05/2016   HDL 56.70 07/05/2016   LDLDIRECT  118.0 07/05/2016   LDLCALC 122 (H) 11/29/2015   ALT 21 02/04/2018   AST 17 02/04/2018   NA 138 02/04/2018   K 4.1 02/04/2018   CL 102 02/04/2018   CREATININE 1.19 02/04/2018   BUN 22 02/04/2018   CO2 31 02/04/2018   TSH 0.96 07/11/2017   PSA 2.32 01/08/2017   HGBA1C 5.6 07/11/2017    Ct Cardiac Scoring  Addendum Date: 06/18/2018   ADDENDUM REPORT: 06/18/2018 17:12 CLINICAL DATA:  Risk stratification EXAM: Coronary Calcium Score TECHNIQUE: The patient was scanned on a Siemens Somatom 64 slice scanner. Axial non-contrast 3 mm slices were carried out through the heart. The data set was analyzed on a dedicated work station and scored using the [place]. FINDINGS: Non-cardiac: See separate report from [facility]. Ascending aorta: Normal diameter 3.5 cm Pericardium: Normal Coronary arteries: No calcium noted IMPRESSION: Coronary calcium score of 0. Jenkins Rouge Electronically Signed   By: Jenkins Rouge M.D.   On: 06/18/2018 17:12   Result Date: 06/18/2018 EXAM: OVER-READ INTERPRETATION  CT CHEST The following report is an over-read performed by radiologist Dr. Aletta Edouard of [facility], [place] on 06/18/2018. This over-read does not include interpretation of cardiac or coronary anatomy or pathology. The coronary calcium score interpretation by the cardiologist is attached. COMPARISON:  None. FINDINGS: Vascular: No incidental findings. Mediastinum/Nodes: Visualized mediastinum and hilar regions show no evidence of lymphadenopathy or masses. Lungs/Pleura: Visualized lungs show no  evidence of pulmonary edema, consolidation, pneumothorax, nodule or pleural fluid. Upper Abdomen: No acute abnormality. Musculoskeletal: Mild rightward convex scoliosis. Other: Bilateral gynecomastia. IMPRESSION: No significant incidental findings.  Bilateral gynecomastia present. Electronically Signed: By: Aletta Edouard M.D. On: 06/18/2018 17:02    Assessment & Plan:   There are no diagnoses linked to this encounter.   No orders of the defined types were placed in this encounter.    Follow-up: No follow-ups on file.  Walker Kehr, MD

## 2018-12-10 NOTE — Patient Instructions (Signed)
These suggestions will probably help you to improve your metabolism if you are not overweight and to lose weight if you are overweight: 1.  Reduce your consumption of sugars and starches.  Eliminate high fructose corn syrup from your diet.  Reduce your consumption of processed foods.  For desserts try to have seasonal fruits, berries with with green, nuts, cheeses or dark chocolate with more than 70% cacao. 2.  Do not snack 3.  You do not have to eat breakfast.  If you choose to have breakfast-eat plain greek yogurt, eggs, oatmeal (without sugar) 4.  Drink water, freshly brewed unsweetened tea (green, black or herbal) or coffee.  Do not drink sodas including diet sodas , juices, beverages sweetened with artificial sweeteners. 5.  Reduce your consumption of refined grains. 6.  Avoid protein drinks such as Optifast, Slim fast etc. Eat chicken, fish, meat, dairy and beans for your sources of protein 7.  Natural unprocessed fats like cold pressed virgin olive oil, butter, coconut oil are good for you.  Eat avocados 8.  Increase your consumption of fiber.  Fruits, berries, vegetables, whole grains, flaxseeds, Chia seeds, beans, popcorn, nuts, oatmeal are good sources of fiber 9.  Use vinegar in your diet, i.e. apple cider vinegar, red wine or balsamic vinegar 10.  You can try fasting.  For example you can skip breakfast and lunch every other day (24-hour fast) 11.  Stress reduction, good night sleep, relaxation, meditation, yoga and other physical activity is likely to help you to maintain low weight too. 12.  If you drink alcohol, limit your alcohol intake to no more than 2 drinks a day.   Mediterranean diet is good for you.  The Mediterranean diet is a way of eating based on the traditional cuisine of countries bordering the [place]. While there is no single definition of the Mediterranean diet, it is typically high in vegetables, fruits, whole grains, beans, nut and seeds, and olive  oil. The main components of Mediterranean diet include: . Daily consumption of vegetables, fruits, whole grains and healthy fats  . Weekly intake of fish, poultry, beans and eggs  . Moderate portions of dairy products  . Limited intake of red meat Other important elements of the Mediterranean diet are sharing meals with family and friends, enjoying a glass of red wine and being physically active. Health benefits of a Mediterranean diet: A traditional Mediterranean diet consisting of large quantities of fresh fruits and vegetables, nuts, fish and olive oil-coupled with physical activity-can reduce your risk of serious mental and physical health problems by: Preventing heart disease and strokes. Following a Mediterranean diet limits your intake of refined breads, processed foods, and red meat, and encourages drinking red wine instead of hard liquor-all factors that can help prevent heart disease and stroke. Keeping you agile. If you're an older adult, the nutrients gained with a Mediterranean diet may reduce your risk of developing muscle weakness and other signs of frailty by about 70 percent. Reducing the risk of Alzheimer's. Research suggests that the Mediterranean diet may improve cholesterol, blood sugar levels, and overall blood vessel health, which in turn may reduce your risk of Alzheimer's disease or dementia. Halving the risk of Parkinson's disease. The high levels of antioxidants in the Mediterranean diet can prevent cells from undergoing a damaging process called oxidative stress, thereby cutting the risk of Parkinson's disease in half. Increasing longevity. By reducing your risk of developing heart disease or cancer with the Mediterranean diet, you're reducing your risk   of death at any age by 20%. Protecting against type 2 diabetes. A Mediterranean diet is rich in fiber which digests slowly, prevents huge swings in blood sugar, and can help you maintain a healthy weight.    Cabbage soup  recipe that will not make you gain weight: Take 1 small head of CABG, 1 average pack of celery, 4 green peppers, 4 onions, 2 cans diced tomatoes (they are not available without salt), salt and spices to taste.  Chop cabbage, celery, peppers and onions.  And tomatoes and 2-2.5 liters (2.5 quarts) of water so that it would just cover the vegetables.  Bring to boil.  Add spices and salt.  Turn heat to low/medium and simmer for 20-25 minutes.  Naturally, you can make a smaller batch and change some of the ingredients.  

## 2018-12-10 NOTE — Assessment & Plan Note (Signed)
Labs

## 2018-12-11 ENCOUNTER — Other Ambulatory Visit (INDEPENDENT_AMBULATORY_CARE_PROVIDER_SITE_OTHER): Payer: 59

## 2018-12-11 DIAGNOSIS — F988 Other specified behavioral and emotional disorders with onset usually occurring in childhood and adolescence: Secondary | ICD-10-CM | POA: Diagnosis not present

## 2018-12-11 DIAGNOSIS — R202 Paresthesia of skin: Secondary | ICD-10-CM | POA: Diagnosis not present

## 2018-12-11 DIAGNOSIS — E291 Testicular hypofunction: Secondary | ICD-10-CM | POA: Diagnosis not present

## 2018-12-11 DIAGNOSIS — L819 Disorder of pigmentation, unspecified: Secondary | ICD-10-CM | POA: Diagnosis not present

## 2018-12-11 LAB — BASIC METABOLIC PANEL
BUN: 28 mg/dL — ABNORMAL HIGH (ref 6–23)
CO2: 29 mEq/L (ref 19–32)
Calcium: 9.6 mg/dL (ref 8.4–10.5)
Chloride: 103 mEq/L (ref 96–112)
Creatinine, Ser: 1.2 mg/dL (ref 0.40–1.50)
GFR: 60.72 mL/min (ref >60.00)
Glucose, Bld: 100 mg/dL — ABNORMAL HIGH (ref 70–99)
Potassium: 5.3 mEq/L — ABNORMAL HIGH (ref 3.5–5.1)
Sodium: 139 mEq/L (ref 135–145)

## 2018-12-11 LAB — HEPATIC FUNCTION PANEL
ALT: 28 U/L (ref 0–53)
AST: 17 U/L (ref 0–37)
Albumin: 4.7 g/dL (ref 3.5–5.2)
Alkaline Phosphatase: 64 U/L (ref 39–117)
Bilirubin, Direct: 0.2 mg/dL (ref 0.0–0.3)
Total Bilirubin: 0.9 mg/dL (ref 0.2–1.2)
Total Protein: 6.7 g/dL (ref 6.0–8.3)

## 2018-12-11 LAB — VITAMIN B12: Vitamin B-12: 407 pg/mL (ref 211–911)

## 2018-12-11 LAB — URINALYSIS
Bilirubin Urine: NEGATIVE
Hgb urine dipstick: NEGATIVE
Ketones, ur: NEGATIVE
Leukocytes,Ua: NEGATIVE
Nitrite: NEGATIVE
Specific Gravity, Urine: 1.025 (ref 1.000–1.030)
Total Protein, Urine: NEGATIVE
Urine Glucose: NEGATIVE
Urobilinogen, UA: 0.2 (ref 0.0–1.0)
pH: 6 (ref 5.0–8.0)

## 2018-12-11 LAB — CBC WITH DIFFERENTIAL/PLATELET
Basophils Absolute: 0.1 10*3/uL (ref 0.0–0.1)
Basophils Relative: 1 % (ref 0.0–3.0)
Eosinophils Absolute: 0.1 10*3/uL (ref 0.0–0.7)
Eosinophils Relative: 0.5 % (ref 0.0–5.0)
HCT: 43.9 % (ref 39.0–52.0)
Hemoglobin: 14.6 g/dL (ref 13.0–17.0)
Lymphocytes Relative: 20.8 % (ref 12.0–46.0)
Lymphs Abs: 2.1 10*3/uL (ref 0.7–4.0)
MCHC: 33.2 g/dL (ref 30.0–36.0)
MCV: 94.2 fl (ref 78.0–100.0)
Monocytes Absolute: 0.8 10*3/uL (ref 0.1–1.0)
Monocytes Relative: 7.5 % (ref 3.0–12.0)
Neutro Abs: 7.1 10*3/uL (ref 1.4–7.7)
Neutrophils Relative %: 70.2 % (ref 43.0–77.0)
Platelets: 336 10*3/uL (ref 150.0–400.0)
RBC: 4.66 Mil/uL (ref 4.22–5.81)
RDW: 13.8 % (ref 11.5–15.5)
WBC: 10.1 10*3/uL (ref 4.0–10.5)

## 2018-12-11 LAB — LIPID PANEL
Cholesterol: 173 mg/dL (ref 0–200)
HDL: 53.7 mg/dL (ref >39.00)
LDL Cholesterol: 84 mg/dL (ref 0–99)
NonHDL: 119.19
Total CHOL/HDL Ratio: 3
Triglycerides: 177 mg/dL — ABNORMAL HIGH (ref 0.0–149.0)
VLDL: 35.4 mg/dL (ref 0.0–40.0)

## 2018-12-11 LAB — VITAMIN D 25 HYDROXY (VIT D DEFICIENCY, FRACTURES): VITD: 35.29 ng/mL (ref 30.00–100.00)

## 2018-12-11 LAB — TESTOSTERONE: Testosterone: 140.68 ng/dL — ABNORMAL LOW (ref 300.00–890.00)

## 2018-12-11 LAB — PSA: PSA: 1.97 ng/mL (ref 0.10–4.00)

## 2018-12-11 LAB — TSH: TSH: 0.61 u[IU]/mL (ref 0.35–4.50)

## 2019-03-13 MED ORDER — TESTOSTERONE CYPIONATE 200 MG/ML IM SOLN: INTRAMUSCULAR | 3 refills | Status: DC

## 2019-03-13 NOTE — Telephone Encounter (Signed)
Done erx 

## 2019-03-20 ENCOUNTER — Other Ambulatory Visit: Payer: Self-pay | Admitting: Internal Medicine

## 2019-03-31 ENCOUNTER — Other Ambulatory Visit: Payer: Self-pay

## 2019-03-31 ENCOUNTER — Encounter: Payer: Self-pay | Admitting: Internal Medicine

## 2019-03-31 ENCOUNTER — Ambulatory Visit (INDEPENDENT_AMBULATORY_CARE_PROVIDER_SITE_OTHER): Payer: 59 | Admitting: Internal Medicine

## 2019-03-31 VITALS — BP 122/68 | HR 56 | Temp 98.1°F | Ht 78.0 in | Wt 215.0 lb

## 2019-03-31 DIAGNOSIS — Z23 Encounter for immunization: Secondary | ICD-10-CM | POA: Diagnosis not present

## 2019-03-31 DIAGNOSIS — G500 Trigeminal neuralgia: Secondary | ICD-10-CM | POA: Diagnosis not present

## 2019-03-31 MED ORDER — METHYLPREDNISOLONE ACETATE 80 MG/ML IJ SUSP
80.0000 mg | Freq: Once | INTRAMUSCULAR | Status: AC
  Administered 2019-03-31: 80 mg via INTRAMUSCULAR

## 2019-03-31 MED ORDER — METHYLPREDNISOLONE 4 MG PO TBPK: ORAL_TABLET | ORAL | 0 refills | Status: DC

## 2019-03-31 MED ORDER — GABAPENTIN 300 MG PO CAPS: 300.0000 mg | ORAL_CAPSULE | Freq: Three times a day (TID) | ORAL | 1 refills | Status: DC

## 2019-03-31 NOTE — Addendum Note (Signed)
Addended by: Karren Cobble on: 03/31/2019 04:46 PM   Modules accepted: Orders

## 2019-03-31 NOTE — Telephone Encounter (Signed)
Pt has appointment today at 120.

## 2019-03-31 NOTE — Progress Notes (Signed)
Subjective:  Patient ID: Victor Montes, male    DOB: 1955/09/21  Age: 63 y.o. MRN: [id number]  CC: No chief complaint on file.   HPI Victor Montes presents for R sided HA 3-4/hr x4 days - pt had a trigeminal neuralgia in 2017 - same sx's  Outpatient Medications Prior to Visit  Medication Sig Dispense Refill  . albuterol (PROAIR HFA) 108 (90 Base) MCG/ACT inhaler Inhale 2 puffs into the lungs every 6 (six) hours as needed for wheezing or shortness of breath. 18 g 5  . amphetamine-dextroamphetamine (ADDERALL) 10 MG tablet Take 1 tablet (10 mg total) by mouth 2 (two) times daily. 60 tablet 0  . amphetamine-dextroamphetamine (ADDERALL) 10 MG tablet Take 1 tablet (10 mg total) by mouth 2 (two) times daily. 60 tablet 0  . amphetamine-dextroamphetamine (ADDERALL) 10 MG tablet Take 1 tablet (10 mg total) by mouth 2 (two) times daily. 60 tablet 0  . amphetamine-dextroamphetamine (ADDERALL) 10 MG tablet Take 1 tablet (10 mg total) by mouth 2 (two) times daily. 60 tablet 0  . amphetamine-dextroamphetamine (ADDERALL) 10 MG tablet Take 1 tablet (10 mg total) by mouth 2 (two) times daily. 60 tablet 0  . aspirin EC 81 MG tablet Take 81 mg by mouth daily.    Marland Kitchen b complex vitamins tablet Take 1 tablet by mouth daily.    Marland Kitchen BREO ELLIPTA 100-25 MCG/INH AEPB USE 1 INHALATION ORALLY    DAILY 1 each 3  . cetirizine (ZYRTEC) 10 MG tablet Take 10 mg by mouth daily.    . cholecalciferol (VITAMIN D) 1000 UNITS tablet Take 1,000 Units by mouth daily.      . Coenzyme Q10 (CO Q 10 PO) Take 1 tablet by mouth daily.    . Diclofenac Sodium (PENNSAID) 2 % SOLN Apply 1 pump twice daily. 112 g 3  . guaiFENesin (MUCINEX) 600 MG 12 hr tablet Take 600 mg by mouth daily with breakfast.    . MAGNESIUM PO Take 1 tablet by mouth daily.    . Multiple Vitamin (MULTIVITAMIN WITH MINERALS) TABS tablet Take 1 tablet by mouth daily.    . nabumetone (RELAFEN) 500 MG tablet Take 1-2 tablets (500-1,000 mg total) by mouth daily as needed.  60 tablet 3  . predniSONE (DELTASONE) 50 MG tablet Take 1 tablet (50 mg total) by mouth daily. 5 tablet 0  . Probiotic Product (PROBIOTIC DAILY) CAPS Take 1 capsule by mouth daily.    . sertraline (ZOLOFT) 50 MG tablet TAKE 1 TABLET DAILY 90 tablet 3  . sildenafil (VIAGRA) 100 MG tablet Take 1 tablet (100 mg total) by mouth as needed for erectile dysfunction. 30 tablet 11  . SYRINGE-NEEDLE, DISP, 3 ML (BD ECLIPSE SYRINGE) 25G X 1" 3 ML MISC USE AS DIRECTED FOR IM INJECTION 50 each 11  . testosterone cypionate (DEPOTESTOSTERONE CYPIONATE) 200 MG/ML injection INJECT 2 ML (CC) INTRAMUSCULARLY EVERY 14 DAYS 10 mL 3  . Vitamin D, Ergocalciferol, (DRISDOL) 50000 units CAPS capsule Take 1 capsule (50,000 Units total) by mouth every 7 (seven) days. 12 capsule 0  . olmesartan (BENICAR) 40 MG tablet TAKE 1 TABLET DAILY (Patient not taking: Reported on 03/31/2019) 90 tablet 3   Facility-Administered Medications Prior to Visit  Medication Dose Route Frequency Provider Last Rate Last Dose  . testosterone cypionate (DEPOTESTOTERONE CYPIONATE) injection 200 mg  200 mg Intramuscular Q14 Days Plotnikov, Evie Lacks, MD   200 mg at 01/18/12 0933    ROS: Review of Systems  Constitutional: Negative for appetite change,  fatigue and unexpected weight change.  HENT: Negative for congestion, nosebleeds, sneezing, sore throat and trouble swallowing.   Eyes: Negative for itching and visual disturbance.  Respiratory: Negative for cough.   Cardiovascular: Negative for chest pain, palpitations and leg swelling.  Gastrointestinal: Negative for abdominal distention, blood in stool, diarrhea and nausea.  Genitourinary: Negative for frequency and hematuria.  Musculoskeletal: Negative for back pain, gait problem, joint swelling and neck pain.  Skin: Negative for rash.  Neurological: Positive for headaches. Negative for dizziness, tremors, speech difficulty and weakness.  Psychiatric/Behavioral: Negative for agitation,  dysphoric mood and sleep disturbance. The patient is not nervous/anxious.     Objective:  BP 122/68 (BP Location: Left Arm, Patient Position: Sitting, Cuff Size: Large)   Pulse (!) 56   Temp 98.1 F (36.7 C) (Oral)   Ht 6\' 6"  (1.981 m)   Wt 215 lb (97.5 kg)   SpO2 97%   BMI 24.85 kg/m   BP Readings from Last 3 Encounters:  03/31/19 122/68  12/10/18 112/76  05/20/18 116/64    Wt Readings from Last 3 Encounters:  03/31/19 215 lb (97.5 kg)  12/10/18 211 lb (95.7 kg)  05/20/18 231 lb (104.8 kg)    Physical Exam Constitutional:      General: He is not in acute distress.    Appearance: He is well-developed.     Comments: NAD  Eyes:     Conjunctiva/sclera: Conjunctivae normal.     Pupils: Pupils are equal, round, and reactive to light.  Neck:     Musculoskeletal: Normal range of motion.     Thyroid: No thyromegaly.     Vascular: No JVD.  Cardiovascular:     Rate and Rhythm: Normal rate and regular rhythm.     Heart sounds: Normal heart sounds. No murmur. No friction rub. No gallop.   Pulmonary:     Effort: Pulmonary effort is normal. No respiratory distress.     Breath sounds: Normal breath sounds. No wheezing or rales.  Chest:     Chest wall: No tenderness.  Abdominal:     General: Bowel sounds are normal. There is no distension.     Palpations: Abdomen is soft. There is no mass.     Tenderness: There is no abdominal tenderness. There is no guarding or rebound.  Musculoskeletal: Normal range of motion.        General: No tenderness. Injury:    Lymphadenopathy:     Cervical: No cervical adenopathy.  Skin:    General: Skin is warm and dry.     Findings: No rash.  Neurological:     Mental Status: He is alert and oriented to person, place, and time.     Cranial Nerves: No cranial nerve deficit.     Motor: No abnormal muscle tone.     Coordination: Coordination normal.     Gait: Gait normal.     Deep Tendon Reflexes: Reflexes are normal and symmetric.   Psychiatric:        Behavior: Behavior normal.        Thought Content: Thought content normal.        Judgment: Judgment normal.    R head - sensitive to palpation Lab Results  Component Value Date   WBC 10.1 12/11/2018   HGB 14.6 12/11/2018   HCT 43.9 12/11/2018   PLT 336.0 12/11/2018   GLUCOSE 100 (H) 12/11/2018   CHOL 173 12/11/2018   TRIG 177.0 (H) 12/11/2018   HDL 53.70 12/11/2018   LDLDIRECT  118.0 07/05/2016   LDLCALC 84 12/11/2018   ALT 28 12/11/2018   AST 17 12/11/2018   NA 139 12/11/2018   K 5.3 No hemolysis seen (H) 12/11/2018   CL 103 12/11/2018   CREATININE 1.20 12/11/2018   BUN 28 (H) 12/11/2018   CO2 29 12/11/2018   TSH 0.61 12/11/2018   PSA 1.97 12/11/2018   HGBA1C 5.6 07/11/2017    Ct Cardiac Scoring  Addendum Date: 06/18/2018   ADDENDUM REPORT: 06/18/2018 17:12 CLINICAL DATA:  Risk stratification EXAM: Coronary Calcium Score TECHNIQUE: The patient was scanned on a Siemens Somatom 64 slice scanner. Axial non-contrast 3 mm slices were carried out through the heart. The data set was analyzed on a dedicated work station and scored using the [place]. FINDINGS: Non-cardiac: See separate report from [facility]. Ascending aorta: Normal diameter 3.5 cm Pericardium: Normal Coronary arteries: No calcium noted IMPRESSION: Coronary calcium score of 0. Jenkins Rouge Electronically Signed   By: Jenkins Rouge M.D.   On: 06/18/2018 17:12   Result Date: 06/18/2018 EXAM: OVER-READ INTERPRETATION  CT CHEST The following report is an over-read performed by radiologist Dr. Aletta Edouard of [facility], [place] on 06/18/2018. This over-read does not include interpretation of cardiac or coronary anatomy or pathology. The coronary calcium score interpretation by the cardiologist is attached. COMPARISON:  None. FINDINGS: Vascular: No incidental findings. Mediastinum/Nodes: Visualized mediastinum and hilar regions show no evidence of lymphadenopathy or masses.  Lungs/Pleura: Visualized lungs show no evidence of pulmonary edema, consolidation, pneumothorax, nodule or pleural fluid. Upper Abdomen: No acute abnormality. Musculoskeletal: Mild rightward convex scoliosis. Other: Bilateral gynecomastia. IMPRESSION: No significant incidental findings.  Bilateral gynecomastia present. Electronically Signed: By: Aletta Edouard M.D. On: 06/18/2018 17:02    Assessment & Plan:   There are no diagnoses linked to this encounter.   No orders of the defined types were placed in this encounter.    Follow-up: No follow-ups on file.  Walker Kehr, MD

## 2019-03-31 NOTE — Patient Instructions (Signed)
Trigeminal Neuralgia  Trigeminal neuralgia is a nerve disorder that causes severe pain on one side of the face. The pain may last from a few seconds to several minutes. The pain is usually only on one side of the face. Symptoms may occur for days, weeks, or months and then go away for months or years. The pain may return and be worse than before. What are the causes? This condition is caused by damage or pressure to a nerve in the head that is called the trigeminal nerve. An attack can be triggered by:  Talking.  Chewing.  Putting on makeup.  Washing your face.  Shaving your face.  Brushing your teeth.  Touching your face. What increases the risk? You are more likely to develop this condition if you:  Are 50 years of age or older.  Are male. What are the signs or symptoms? The main symptom of this condition is severe pain in the:  Jaw.  Lips.  Eyes.  Nose.  Scalp.  Forehead.  Face. The pain may be:  Intense.  Stabbing.  Electric.  Shock-like. How is this diagnosed? This condition is diagnosed with a physical exam. A CT scan or an MRI may be done to rule out other conditions that can cause facial pain. How is this treated? This condition may be treated with:  Avoiding the things that trigger your symptoms.  Taking prescription medicines (anticonvulsants).  Having surgery. This may be done in severe cases if other medical treatment does not provide relief.  Having procedures such as ablation, thermal, or radiation therapy. It may take up to one month for treatment to start relieving the pain. Follow these instructions at home: Managing pain  Learn as much as you can about how to manage your pain. Ask your health care provider if a pain specialist would be helpful.  Consider talking with a mental health care provider (psychologist) about how to cope with the pain.  Consider joining a pain support group. General instructions  Take  over-the-counter and prescription medicines only as told by your health care provider.  Avoid the things that trigger your symptoms. It may help to: ? Chew on the unaffected side of your mouth. ? Avoid touching your face. ? Avoid blasts of hot or cold air.  Follow your treatment plan as told by your health care provider. This may include: ? Cognitive or behavioral therapy. ? Gentle, regular exercise. ? Meditation or yoga. ? Aromatherapy.  Keep all follow-up visits as told by your health care provider. You may need to be monitored closely to make sure treatment is working well for you. Where to find more information  Facial Pain Association: fpa-support.org Contact a health care provider if:  Your medicine is not helping your symptoms.  You have side effects from the medicine used for treatment.  You develop new, unexplained symptoms, such as: ? Double vision. ? Facial weakness. ? Facial numbness. ? Changes in hearing or balance.  You feel depressed. Get help right away if:  Your pain is severe and is not getting better.  You develop suicidal thoughts. If you ever feel like you may hurt yourself or others, or have thoughts about taking your own life, get help right away. You can go to your nearest emergency department or call:  Your local emergency services (911 in the [place]  A suicide crisis helpline, such as the National Suicide Prevention Lifeline at 1-800-273-8255. This is open 24 hours a day. Summary  Trigeminal neuralgia is a   nerve disorder that causes severe pain on one side of the face. The pain may last from a few seconds to several minutes.  This condition is caused by damage or pressure to a nerve in the head that is called the trigeminal nerve.  Treatment may include avoiding the things that trigger your symptoms, taking medicines, or having surgery or procedures. It may take up to one month for treatment to start relieving the pain.  Avoid the things that  trigger your symptoms.  Keep all follow-up visits as told by your health care provider. You may need to be monitored closely to make sure treatment is working well for you. This information is not intended to replace advice given to you by your health care provider. Make sure you discuss any questions you have with your health care provider. Document Released: 05/05/2000 Document Revised: 03/25/2018 Document Reviewed: 03/25/2018 Elsevier Patient Education  2020 [employer]  

## 2019-03-31 NOTE — Assessment & Plan Note (Signed)
Gabapentin300-600 mg tid Medrol pack Depo 80 mg IM now

## 2019-04-08 ENCOUNTER — Encounter: Payer: Self-pay | Admitting: Internal Medicine

## 2019-04-08 ENCOUNTER — Other Ambulatory Visit (INDEPENDENT_AMBULATORY_CARE_PROVIDER_SITE_OTHER): Payer: 59

## 2019-04-08 ENCOUNTER — Ambulatory Visit (INDEPENDENT_AMBULATORY_CARE_PROVIDER_SITE_OTHER): Payer: 59 | Admitting: Internal Medicine

## 2019-04-08 ENCOUNTER — Other Ambulatory Visit: Payer: Self-pay

## 2019-04-08 DIAGNOSIS — M5481 Occipital neuralgia: Secondary | ICD-10-CM | POA: Diagnosis not present

## 2019-04-08 DIAGNOSIS — E291 Testicular hypofunction: Secondary | ICD-10-CM

## 2019-04-08 DIAGNOSIS — G44059 Short lasting unilateral neuralgiform headache with conjunctival injection and tearing (SUNCT), not intractable: Secondary | ICD-10-CM

## 2019-04-08 DIAGNOSIS — I10 Essential (primary) hypertension: Secondary | ICD-10-CM | POA: Diagnosis not present

## 2019-04-08 DIAGNOSIS — E785 Hyperlipidemia, unspecified: Secondary | ICD-10-CM

## 2019-04-08 DIAGNOSIS — N3281 Overactive bladder: Secondary | ICD-10-CM | POA: Insufficient documentation

## 2019-04-08 LAB — PSA: PSA: 2.69 ng/mL (ref 0.10–4.00)

## 2019-04-08 LAB — BASIC METABOLIC PANEL
BUN: 19 mg/dL (ref 6–23)
CO2: 28 mEq/L (ref 19–32)
Calcium: 9.3 mg/dL (ref 8.4–10.5)
Chloride: 101 mEq/L (ref 96–112)
Creatinine, Ser: 0.89 mg/dL (ref 0.40–1.50)
GFR: 85.64 mL/min (ref >60.00)
Glucose, Bld: 103 mg/dL — ABNORMAL HIGH (ref 70–99)
Potassium: 4.2 mEq/L (ref 3.5–5.1)
Sodium: 138 mEq/L (ref 135–145)

## 2019-04-08 LAB — CBC WITH DIFFERENTIAL/PLATELET
Basophils Absolute: 0.1 10*3/uL (ref 0.0–0.1)
Basophils Relative: 1 % (ref 0.0–3.0)
Eosinophils Absolute: 0.1 10*3/uL (ref 0.0–0.7)
Eosinophils Relative: 0.8 % (ref 0.0–5.0)
HCT: 44 % (ref 39.0–52.0)
Hemoglobin: 14.5 g/dL (ref 13.0–17.0)
Lymphocytes Relative: 20.2 % (ref 12.0–46.0)
Lymphs Abs: 2 10*3/uL (ref 0.7–4.0)
MCHC: 33.1 g/dL (ref 30.0–36.0)
MCV: 91.3 fl (ref 78.0–100.0)
Monocytes Absolute: 0.8 10*3/uL (ref 0.1–1.0)
Monocytes Relative: 8.3 % (ref 3.0–12.0)
Neutro Abs: 6.8 10*3/uL (ref 1.4–7.7)
Neutrophils Relative %: 69.7 % (ref 43.0–77.0)
Platelets: 342 10*3/uL (ref 150.0–400.0)
RBC: 4.82 Mil/uL (ref 4.22–5.81)
RDW: 13.9 % (ref 11.5–15.5)
WBC: 9.8 10*3/uL (ref 4.0–10.5)

## 2019-04-08 LAB — LIPID PANEL
Cholesterol: 199 mg/dL (ref 0–200)
HDL: 74 mg/dL (ref >39.00)
LDL Cholesterol: 105 mg/dL — ABNORMAL HIGH (ref 0–99)
NonHDL: 124.78
Total CHOL/HDL Ratio: 3
Triglycerides: 100 mg/dL (ref 0.0–149.0)
VLDL: 20 mg/dL (ref 0.0–40.0)

## 2019-04-08 LAB — HEPATIC FUNCTION PANEL
ALT: 24 U/L (ref 0–53)
AST: 13 U/L (ref 0–37)
Albumin: 4.8 g/dL (ref 3.5–5.2)
Alkaline Phosphatase: 70 U/L (ref 39–117)
Bilirubin, Direct: 0.1 mg/dL (ref 0.0–0.3)
Total Bilirubin: 0.6 mg/dL (ref 0.2–1.2)
Total Protein: 7.1 g/dL (ref 6.0–8.3)

## 2019-04-08 LAB — TSH: TSH: 0.75 u[IU]/mL (ref 0.35–4.50)

## 2019-04-08 MED ORDER — AMPHETAMINE-DEXTROAMPHETAMINE 10 MG PO TABS: 10.0000 mg | ORAL_TABLET | Freq: Two times a day (BID) | ORAL | 0 refills | Status: DC

## 2019-04-08 MED ORDER — GABAPENTIN 300 MG PO CAPS: 300.0000 mg | ORAL_CAPSULE | Freq: Three times a day (TID) | ORAL | 1 refills | Status: DC

## 2019-04-08 MED ORDER — TESTOSTERONE CYPIONATE 200 MG/ML IM SOLN: INTRAMUSCULAR | 3 refills | Status: DC

## 2019-04-08 NOTE — Assessment & Plan Note (Signed)
On testosterone 

## 2019-04-08 NOTE — Assessment & Plan Note (Signed)
Much better 11/20 Gabapentin, steroids

## 2019-04-08 NOTE — Progress Notes (Signed)
Subjective:  Patient ID: Victor Montes, male    DOB: 07-23-1955  Age: 63 y.o. MRN: [id number]  CC: No chief complaint on file.   HPI Victor Montes presents for HAs - better Gabapentin helped w/OAB F/u ADD   Outpatient Medications Prior to Visit  Medication Sig Dispense Refill  . albuterol (PROAIR HFA) 108 (90 Base) MCG/ACT inhaler Inhale 2 puffs into the lungs every 6 (six) hours as needed for wheezing or shortness of breath. 18 g 5  . amphetamine-dextroamphetamine (ADDERALL) 10 MG tablet Take 1 tablet (10 mg total) by mouth 2 (two) times daily. 60 tablet 0  . amphetamine-dextroamphetamine (ADDERALL) 10 MG tablet Take 1 tablet (10 mg total) by mouth 2 (two) times daily. 60 tablet 0  . amphetamine-dextroamphetamine (ADDERALL) 10 MG tablet Take 1 tablet (10 mg total) by mouth 2 (two) times daily. 60 tablet 0  . amphetamine-dextroamphetamine (ADDERALL) 10 MG tablet Take 1 tablet (10 mg total) by mouth 2 (two) times daily. 60 tablet 0  . amphetamine-dextroamphetamine (ADDERALL) 10 MG tablet Take 1 tablet (10 mg total) by mouth 2 (two) times daily. 60 tablet 0  . b complex vitamins tablet Take 1 tablet by mouth daily.    Marland Kitchen BREO ELLIPTA 100-25 MCG/INH AEPB USE 1 INHALATION ORALLY    DAILY 1 each 3  . cetirizine (ZYRTEC) 10 MG tablet Take 10 mg by mouth daily.    . cholecalciferol (VITAMIN D) 1000 UNITS tablet Take 1,000 Units by mouth daily.      . Coenzyme Q10 (CO Q 10 PO) Take 1 tablet by mouth daily.    Marland Kitchen gabapentin (NEURONTIN) 300 MG capsule Take 1 capsule (300 mg total) by mouth 3 (three) times daily. 90 capsule 1  . guaiFENesin (MUCINEX) 600 MG 12 hr tablet Take 600 mg by mouth daily with breakfast.    . MAGNESIUM PO Take 1 tablet by mouth daily.    . methylPREDNISolone (MEDROL DOSEPAK) 4 MG TBPK tablet As directed 21 tablet 0  . Multiple Vitamin (MULTIVITAMIN WITH MINERALS) TABS tablet Take 1 tablet by mouth daily.    . nabumetone (RELAFEN) 500 MG tablet Take 1-2 tablets (500-1,000  mg total) by mouth daily as needed. 60 tablet 3  . predniSONE (DELTASONE) 50 MG tablet Take 1 tablet (50 mg total) by mouth daily. 5 tablet 0  . Probiotic Product (PROBIOTIC DAILY) CAPS Take 1 capsule by mouth daily.    . sertraline (ZOLOFT) 50 MG tablet TAKE 1 TABLET DAILY 90 tablet 3  . sildenafil (VIAGRA) 100 MG tablet Take 1 tablet (100 mg total) by mouth as needed for erectile dysfunction. 30 tablet 11  . SYRINGE-NEEDLE, DISP, 3 ML (BD ECLIPSE SYRINGE) 25G X 1" 3 ML MISC USE AS DIRECTED FOR IM INJECTION 50 each 11  . testosterone cypionate (DEPOTESTOSTERONE CYPIONATE) 200 MG/ML injection INJECT 2 ML (CC) INTRAMUSCULARLY EVERY 14 DAYS 10 mL 3  . Vitamin D, Ergocalciferol, (DRISDOL) 50000 units CAPS capsule Take 1 capsule (50,000 Units total) by mouth every 7 (seven) days. 12 capsule 0  . aspirin EC 81 MG tablet Take 81 mg by mouth daily.    . Diclofenac Sodium (PENNSAID) 2 % SOLN Apply 1 pump twice daily. 112 g 3   Facility-Administered Medications Prior to Visit  Medication Dose Route Frequency Provider Last Rate Last Dose  . testosterone cypionate (DEPOTESTOTERONE CYPIONATE) injection 200 mg  200 mg Intramuscular Q14 Days Plotnikov, Evie Lacks, MD   200 mg at 01/18/12 0933    ROS: Review  of Systems  Constitutional: Negative for appetite change, fatigue and unexpected weight change.  HENT: Negative for congestion, nosebleeds, sneezing, sore throat and trouble swallowing.   Eyes: Negative for itching and visual disturbance.  Respiratory: Negative for cough.   Cardiovascular: Negative for chest pain, palpitations and leg swelling.  Gastrointestinal: Negative for abdominal distention, blood in stool, diarrhea and nausea.  Genitourinary: Positive for frequency and urgency. Negative for hematuria.  Musculoskeletal: Negative for back pain, gait problem, joint swelling and neck pain.  Skin: Negative for rash.  Neurological: Positive for headaches. Negative for dizziness, tremors, speech  difficulty and weakness.  Psychiatric/Behavioral: Negative for agitation, dysphoric mood and sleep disturbance. The patient is not nervous/anxious.     Objective:  BP 124/68 (BP Location: Left Arm, Patient Position: Sitting, Cuff Size: Large)   Pulse (!) 52   Temp 97.8 F (36.6 C) (Oral)   Ht 6\' 6"  (1.981 m)   Wt 213 lb (96.6 kg)   SpO2 99%   BMI 24.61 kg/m   BP Readings from Last 3 Encounters:  04/08/19 124/68  03/31/19 122/68  12/10/18 112/76    Wt Readings from Last 3 Encounters:  04/08/19 213 lb (96.6 kg)  03/31/19 215 lb (97.5 kg)  12/10/18 211 lb (95.7 kg)    Physical Exam Constitutional:      General: He is not in acute distress.    Appearance: He is well-developed.     Comments: NAD  Eyes:     Conjunctiva/sclera: Conjunctivae normal.     Pupils: Pupils are equal, round, and reactive to light.  Neck:     Musculoskeletal: Normal range of motion.     Thyroid: No thyromegaly.     Vascular: No JVD.  Cardiovascular:     Rate and Rhythm: Normal rate and regular rhythm.     Heart sounds: Normal heart sounds. No murmur. No friction rub. No gallop.   Pulmonary:     Effort: Pulmonary effort is normal. No respiratory distress.     Breath sounds: Normal breath sounds. No wheezing or rales.  Chest:     Chest wall: No tenderness.  Abdominal:     General: Bowel sounds are normal. There is no distension.     Palpations: Abdomen is soft. There is no mass.     Tenderness: There is no abdominal tenderness. There is no guarding or rebound.  Musculoskeletal: Normal range of motion.        General: No tenderness.  Lymphadenopathy:     Cervical: No cervical adenopathy.  Skin:    General: Skin is warm and dry.     Findings: No rash.  Neurological:     Mental Status: He is alert and oriented to person, place, and time.     Cranial Nerves: No cranial nerve deficit.     Motor: No abnormal muscle tone.     Coordination: Coordination normal.     Gait: Gait normal.     Deep  Tendon Reflexes: Reflexes are normal and symmetric.  Psychiatric:        Behavior: Behavior normal.        Thought Content: Thought content normal.        Judgment: Judgment normal.     Rectal - per GI (w/colonoscopy) Lab Results  Component Value Date   WBC 10.1 12/11/2018   HGB 14.6 12/11/2018   HCT 43.9 12/11/2018   PLT 336.0 12/11/2018   GLUCOSE 100 (H) 12/11/2018   CHOL 173 12/11/2018   TRIG 177.0 (H) 12/11/2018  HDL 53.70 12/11/2018   LDLDIRECT 118.0 07/05/2016   LDLCALC 84 12/11/2018   ALT 28 12/11/2018   AST 17 12/11/2018   NA 139 12/11/2018   K 5.3 No hemolysis seen (H) 12/11/2018   CL 103 12/11/2018   CREATININE 1.20 12/11/2018   BUN 28 (H) 12/11/2018   CO2 29 12/11/2018   TSH 0.61 12/11/2018   PSA 1.97 12/11/2018   HGBA1C 5.6 07/11/2017    Ct Cardiac Scoring  Addendum Date: 06/18/2018   ADDENDUM REPORT: 06/18/2018 17:12 CLINICAL DATA:  Risk stratification EXAM: Coronary Calcium Score TECHNIQUE: The patient was scanned on a Siemens Somatom 64 slice scanner. Axial non-contrast 3 mm slices were carried out through the heart. The data set was analyzed on a dedicated work station and scored using the [place]. FINDINGS: Non-cardiac: See separate report from [facility]. Ascending aorta: Normal diameter 3.5 cm Pericardium: Normal Coronary arteries: No calcium noted IMPRESSION: Coronary calcium score of 0. Jenkins Rouge Electronically Signed   By: Jenkins Rouge M.D.   On: 06/18/2018 17:12   Result Date: 06/18/2018 EXAM: OVER-READ INTERPRETATION  CT CHEST The following report is an over-read performed by radiologist Dr. Aletta Edouard of [facility], [place] on 06/18/2018. This over-read does not include interpretation of cardiac or coronary anatomy or pathology. The coronary calcium score interpretation by the cardiologist is attached. COMPARISON:  None. FINDINGS: Vascular: No incidental findings. Mediastinum/Nodes: Visualized mediastinum and hilar regions  show no evidence of lymphadenopathy or masses. Lungs/Pleura: Visualized lungs show no evidence of pulmonary edema, consolidation, pneumothorax, nodule or pleural fluid. Upper Abdomen: No acute abnormality. Musculoskeletal: Mild rightward convex scoliosis. Other: Bilateral gynecomastia. IMPRESSION: No significant incidental findings.  Bilateral gynecomastia present. Electronically Signed: By: Aletta Edouard M.D. On: 06/18/2018 17:02    Assessment & Plan:   There are no diagnoses linked to this encounter.   No orders of the defined types were placed in this encounter.    Follow-up: No follow-ups on file.  Walker Kehr, MD

## 2019-04-08 NOTE — Assessment & Plan Note (Signed)
Much better 

## 2019-04-08 NOTE — Assessment & Plan Note (Signed)
OAB - Gabapentin helped 11/20

## 2019-04-08 NOTE — Assessment & Plan Note (Signed)
Labs

## 2019-04-08 NOTE — Assessment & Plan Note (Signed)
Losartan 

## 2019-05-15 ENCOUNTER — Other Ambulatory Visit: Payer: Self-pay | Admitting: Internal Medicine

## 2019-05-15 MED ORDER — SERTRALINE HCL 50 MG PO TABS: 50.0000 mg | ORAL_TABLET | Freq: Every day | ORAL | 0 refills | Status: DC

## 2019-07-21 ENCOUNTER — Ambulatory Visit: Payer: 59 | Attending: Internal Medicine

## 2019-07-21 DIAGNOSIS — Z23 Encounter for immunization: Secondary | ICD-10-CM | POA: Insufficient documentation

## 2019-07-21 NOTE — Progress Notes (Signed)
   Covid-19 Vaccination Clinic  Name:  Victor Montes    MRN: [id number] DOB: [birth year]  07/21/2019  [name] was observed post Covid-19 immunization for 15 minutes without incidence. He was provided with Vaccine Information Sheet and instruction to access the V-Safe system.   [name] was instructed to call 911 with any severe reactions post vaccine: Marland Kitchen Difficulty breathing  . Swelling of your face and throat  . A fast heartbeat  . A bad rash all over your body  . Dizziness and weakness    Immunizations Administered    Name Date Dose VIS Date Route   Pfizer COVID-19 Vaccine 07/21/2019 11:46 AM 0.3 mL 05/02/2019 Intramuscular   Manufacturer: [place]   Lot: [id number]   [place]: [id number]

## 2019-08-19 ENCOUNTER — Ambulatory Visit: Payer: 59 | Attending: Internal Medicine

## 2019-08-19 DIAGNOSIS — Z23 Encounter for immunization: Secondary | ICD-10-CM

## 2019-08-19 NOTE — Progress Notes (Signed)
   Covid-19 Vaccination Clinic  Name:  Nicolaas Landuyt    MRN: [id number] DOB: [birth year]  08/19/2019  Mr. Allen was observed post Covid-19 immunization for 15 minutes without incident. He was provided with Vaccine Information Sheet and instruction to access the V-Safe system.   Mr. Glendening was instructed to call 911 with any severe reactions post vaccine: Marland Kitchen Difficulty breathing  . Swelling of face and throat  . A fast heartbeat  . A bad rash all over body  . Dizziness and weakness   Immunizations Administered    Name Date Dose VIS Date Route   Pfizer COVID-19 Vaccine 08/19/2019 11:43 AM 0.3 mL 05/02/2019 Intramuscular   Manufacturer: [place]   Lot: [id number]   [place]: [id number]

## 2019-08-24 ENCOUNTER — Other Ambulatory Visit: Payer: Self-pay | Admitting: Internal Medicine

## 2019-09-16 ENCOUNTER — Other Ambulatory Visit: Payer: Self-pay | Admitting: Internal Medicine

## 2019-09-23 ENCOUNTER — Encounter: Payer: Self-pay | Admitting: Internal Medicine

## 2019-09-23 ENCOUNTER — Ambulatory Visit: Payer: 59 | Admitting: Internal Medicine

## 2019-09-23 ENCOUNTER — Other Ambulatory Visit: Payer: Self-pay

## 2019-09-23 VITALS — BP 124/72 | HR 63 | Temp 98.7°F | Ht 78.0 in | Wt 237.0 lb

## 2019-09-23 DIAGNOSIS — J4521 Mild intermittent asthma with (acute) exacerbation: Secondary | ICD-10-CM

## 2019-09-23 DIAGNOSIS — E291 Testicular hypofunction: Secondary | ICD-10-CM

## 2019-09-23 DIAGNOSIS — M542 Cervicalgia: Secondary | ICD-10-CM

## 2019-09-23 DIAGNOSIS — F988 Other specified behavioral and emotional disorders with onset usually occurring in childhood and adolescence: Secondary | ICD-10-CM | POA: Diagnosis not present

## 2019-09-23 DIAGNOSIS — N3281 Overactive bladder: Secondary | ICD-10-CM | POA: Diagnosis not present

## 2019-09-23 DIAGNOSIS — E785 Hyperlipidemia, unspecified: Secondary | ICD-10-CM

## 2019-09-23 LAB — HEPATIC FUNCTION PANEL
ALT: 27 U/L (ref 0–53)
AST: 19 U/L (ref 0–37)
Albumin: 4.4 g/dL (ref 3.5–5.2)
Alkaline Phosphatase: 79 U/L (ref 39–117)
Bilirubin, Direct: 0.1 mg/dL (ref 0.0–0.3)
Total Bilirubin: 0.9 mg/dL (ref 0.2–1.2)
Total Protein: 6.7 g/dL (ref 6.0–8.3)

## 2019-09-23 LAB — LIPID PANEL
Cholesterol: 191 mg/dL (ref 0–200)
HDL: 48.6 mg/dL (ref >39.00)
NonHDL: 142.49
Total CHOL/HDL Ratio: 4
Triglycerides: 321 mg/dL — ABNORMAL HIGH (ref 0.0–149.0)
VLDL: 64.2 mg/dL — ABNORMAL HIGH (ref 0.0–40.0)

## 2019-09-23 LAB — TSH: TSH: 0.85 u[IU]/mL (ref 0.35–4.50)

## 2019-09-23 LAB — CBC WITH DIFFERENTIAL/PLATELET
Basophils Absolute: 0.1 10*3/uL (ref 0.0–0.1)
Basophils Relative: 1.2 % (ref 0.0–3.0)
Eosinophils Absolute: 0.1 10*3/uL (ref 0.0–0.7)
Eosinophils Relative: 0.8 % (ref 0.0–5.0)
HCT: 45.5 % (ref 39.0–52.0)
Hemoglobin: 15.3 g/dL (ref 13.0–17.0)
Lymphocytes Relative: 21 % (ref 12.0–46.0)
Lymphs Abs: 1.8 10*3/uL (ref 0.7–4.0)
MCHC: 33.6 g/dL (ref 30.0–36.0)
MCV: 92.5 fl (ref 78.0–100.0)
Monocytes Absolute: 0.8 10*3/uL (ref 0.1–1.0)
Monocytes Relative: 9.3 % (ref 3.0–12.0)
Neutro Abs: 5.8 10*3/uL (ref 1.4–7.7)
Neutrophils Relative %: 67.7 % (ref 43.0–77.0)
Platelets: 341 10*3/uL (ref 150.0–400.0)
RBC: 4.92 Mil/uL (ref 4.22–5.81)
RDW: 14.2 % (ref 11.5–15.5)
WBC: 8.5 10*3/uL (ref 4.0–10.5)

## 2019-09-23 LAB — BASIC METABOLIC PANEL
BUN: 18 mg/dL (ref 6–23)
CO2: 30 mEq/L (ref 19–32)
Calcium: 9.3 mg/dL (ref 8.4–10.5)
Chloride: 103 mEq/L (ref 96–112)
Creatinine, Ser: 0.98 mg/dL (ref 0.40–1.50)
GFR: 76.52 mL/min (ref >60.00)
Glucose, Bld: 98 mg/dL (ref 70–99)
Potassium: 4.5 mEq/L (ref 3.5–5.1)
Sodium: 140 mEq/L (ref 135–145)

## 2019-09-23 LAB — LDL CHOLESTEROL, DIRECT: Direct LDL: 112 mg/dL

## 2019-09-23 LAB — TESTOSTERONE: Testosterone: 221.15 ng/dL — ABNORMAL LOW (ref 300.00–890.00)

## 2019-09-23 LAB — PSA: PSA: 1.06 ng/mL (ref 0.10–4.00)

## 2019-09-23 MED ORDER — TADALAFIL 5 MG PO TABS: 5.0000 mg | ORAL_TABLET | Freq: Every day | ORAL | 3 refills | Status: DC

## 2019-09-23 MED ORDER — AMPHETAMINE-DEXTROAMPHETAMINE 10 MG PO TABS: 10.0000 mg | ORAL_TABLET | Freq: Two times a day (BID) | ORAL | 0 refills | Status: DC

## 2019-09-23 MED ORDER — METHYLPREDNISOLONE ACETATE 80 MG/ML IJ SUSP
80.0000 mg | Freq: Once | INTRAMUSCULAR | Status: AC
  Administered 2019-09-23: 80 mg via INTRAMUSCULAR

## 2019-09-23 NOTE — Assessment & Plan Note (Signed)
New pillow

## 2019-09-23 NOTE — Assessment & Plan Note (Signed)
Cialis qd for BPH Urol ref offered

## 2019-09-23 NOTE — Assessment & Plan Note (Signed)
Worse Depo-medrol 80 mg IM 

## 2019-09-23 NOTE — Progress Notes (Signed)
Subjective:  Patient ID: Antoinio Gobbi, male    DOB: Feb 22, 1956  Age: 64 y.o. MRN: [id number]  CC: No chief complaint on file.   HPI Pinckney Ferlita presents for hypogonadism, ADD,  Asthma f/u C/o BPH sx's - worse  Outpatient Medications Prior to Visit  Medication Sig Dispense Refill  . albuterol (VENTOLIN HFA) 108 (90 Base) MCG/ACT inhaler USE 2 INHALATIONS ORALLY   EVERY 6 HOURS AS NEEDED FORWHEEZING OR SHORTNESS OF   BREATH 17 g 5  . amphetamine-dextroamphetamine (ADDERALL) 10 MG tablet Take 1 tablet (10 mg total) by mouth 2 (two) times daily. 60 tablet 0  . amphetamine-dextroamphetamine (ADDERALL) 10 MG tablet Take 1 tablet (10 mg total) by mouth 2 (two) times daily. 60 tablet 0  . amphetamine-dextroamphetamine (ADDERALL) 10 MG tablet Take 1 tablet (10 mg total) by mouth 2 (two) times daily. 60 tablet 0  . b complex vitamins tablet Take 1 tablet by mouth daily.    Marland Kitchen BREO ELLIPTA 100-25 MCG/INH AEPB USE 1 INHALATION ORALLY    DAILY 1 each 3  . cetirizine (ZYRTEC) 10 MG tablet Take 10 mg by mouth daily.    . cholecalciferol (VITAMIN D) 1000 UNITS tablet Take 1,000 Units by mouth daily.      . Coenzyme Q10 (CO Q 10 PO) Take 1 tablet by mouth daily.    Marland Kitchen gabapentin (NEURONTIN) 300 MG capsule Take 1 capsule (300 mg total) by mouth 3 (three) times daily. 270 capsule 1  . guaiFENesin (MUCINEX) 600 MG 12 hr tablet Take 600 mg by mouth daily with breakfast.    . MAGNESIUM PO Take 1 tablet by mouth daily.    . Multiple Vitamin (MULTIVITAMIN WITH MINERALS) TABS tablet Take 1 tablet by mouth daily.    . nabumetone (RELAFEN) 500 MG tablet Take 1-2 tablets (500-1,000 mg total) by mouth daily as needed. 60 tablet 3  . Probiotic Product (PROBIOTIC DAILY) CAPS Take 1 capsule by mouth daily.    . sertraline (ZOLOFT) 50 MG tablet TAKE 1 TABLET DAILY 90 tablet 0  . sildenafil (VIAGRA) 100 MG tablet Take 1 tablet (100 mg total) by mouth as needed for erectile dysfunction. 30 tablet 11  . SYRINGE-NEEDLE,  DISP, 3 ML (BD ECLIPSE SYRINGE) 25G X 1" 3 ML MISC USE AS DIRECTED FOR IM INJECTION 50 each 11  . testosterone cypionate (DEPOTESTOSTERONE CYPIONATE) 200 MG/ML injection INJECT 2 ML (CC) INTRAMUSCULARLY EVERY 14 DAYS 10 mL 3  . Vitamin D, Ergocalciferol, (DRISDOL) 50000 units CAPS capsule Take 1 capsule (50,000 Units total) by mouth every 7 (seven) days. 12 capsule 0  . methylPREDNISolone (MEDROL DOSEPAK) 4 MG TBPK tablet As directed 21 tablet 0  . predniSONE (DELTASONE) 50 MG tablet Take 1 tablet (50 mg total) by mouth daily. 5 tablet 0   Facility-Administered Medications Prior to Visit  Medication Dose Route Frequency Provider Last Rate Last Admin  . testosterone cypionate (DEPOTESTOTERONE CYPIONATE) injection 200 mg  200 mg Intramuscular Q14 Days Ellieana Dolecki, Evie Lacks, MD   200 mg at 01/18/12 0933    ROS: Review of Systems  Constitutional: Negative for appetite change, fatigue and unexpected weight change.  HENT: Positive for congestion and rhinorrhea. Negative for nosebleeds, sneezing, sore throat and trouble swallowing.   Eyes: Positive for itching. Negative for visual disturbance.  Respiratory: Positive for cough.   Cardiovascular: Negative for chest pain, palpitations and leg swelling.  Gastrointestinal: Negative for abdominal distention, blood in stool, diarrhea and nausea.  Genitourinary: Positive for frequency and urgency.  Negative for hematuria.  Musculoskeletal: Negative for back pain, gait problem, joint swelling and neck pain.  Skin: Negative for rash.  Neurological: Negative for dizziness, tremors, speech difficulty and weakness.  Psychiatric/Behavioral: Positive for decreased concentration. Negative for agitation, dysphoric mood and sleep disturbance. The patient is not nervous/anxious.     Objective:  BP 124/72 (BP Location: Left Arm, Patient Position: Sitting, Cuff Size: Large)   Pulse 63   Temp 98.7 F (37.1 C) (Oral)   Ht 6\' 6"  (1.981 m)   Wt 237 lb (107.5 kg)    SpO2 96%   BMI 27.39 kg/m   BP Readings from Last 3 Encounters:  09/23/19 124/72  04/08/19 124/68  03/31/19 122/68    Wt Readings from Last 3 Encounters:  09/23/19 237 lb (107.5 kg)  04/08/19 213 lb (96.6 kg)  03/31/19 215 lb (97.5 kg)    Physical Exam Constitutional:      General: He is not in acute distress.    Appearance: He is well-developed.     Comments: NAD  Eyes:     Conjunctiva/sclera: Conjunctivae normal.     Pupils: Pupils are equal, round, and reactive to light.  Neck:     Thyroid: No thyromegaly.     Vascular: No JVD.  Cardiovascular:     Rate and Rhythm: Normal rate and regular rhythm.     Heart sounds: Normal heart sounds. No murmur. No friction rub. No gallop.   Pulmonary:     Effort: Pulmonary effort is normal. No respiratory distress.     Breath sounds: Normal breath sounds. No wheezing or rales.  Chest:     Chest wall: No tenderness.  Abdominal:     General: Bowel sounds are normal. There is no distension.     Palpations: Abdomen is soft. There is no mass.     Tenderness: There is no abdominal tenderness. There is no guarding or rebound.  Musculoskeletal:        General: No tenderness. Normal range of motion.     Cervical back: Normal range of motion.  Lymphadenopathy:     Cervical: No cervical adenopathy.  Skin:    General: Skin is warm and dry.     Findings: No rash.  Neurological:     Mental Status: He is alert and oriented to person, place, and time.     Cranial Nerves: No cranial nerve deficit.     Motor: No abnormal muscle tone.     Coordination: Coordination normal.     Gait: Gait normal.     Deep Tendon Reflexes: Reflexes are normal and symmetric.  Psychiatric:        Behavior: Behavior normal.        Thought Content: Thought content normal.        Judgment: Judgment normal.     Lab Results  Component Value Date   WBC 9.8 04/08/2019   HGB 14.5 04/08/2019   HCT 44.0 04/08/2019   PLT 342.0 04/08/2019   GLUCOSE 103 (H)  04/08/2019   CHOL 199 04/08/2019   TRIG 100.0 04/08/2019   HDL 74.00 04/08/2019   LDLDIRECT 118.0 07/05/2016   LDLCALC 105 (H) 04/08/2019   ALT 24 04/08/2019   AST 13 04/08/2019   NA 138 04/08/2019   K 4.2 04/08/2019   CL 101 04/08/2019   CREATININE 0.89 04/08/2019   BUN 19 04/08/2019   CO2 28 04/08/2019   TSH 0.75 04/08/2019   PSA 2.69 04/08/2019   HGBA1C 5.6 07/11/2017  CT CARDIAC SCORING  Addendum Date: 06/18/2018   ADDENDUM REPORT: 06/18/2018 17:12 CLINICAL DATA:  Risk stratification EXAM: Coronary Calcium Score TECHNIQUE: The patient was scanned on a Siemens Somatom 64 slice scanner. Axial non-contrast 3 mm slices were carried out through the heart. The data set was analyzed on a dedicated work station and scored using the [place]. FINDINGS: Non-cardiac: See separate report from [facility]. Ascending aorta: Normal diameter 3.5 cm Pericardium: Normal Coronary arteries: No calcium noted IMPRESSION: Coronary calcium score of 0. Jenkins Rouge Electronically Signed   By: Jenkins Rouge M.D.   On: 06/18/2018 17:12   Result Date: 06/18/2018 EXAM: OVER-READ INTERPRETATION  CT CHEST The following report is an over-read performed by radiologist Dr. Aletta Edouard of [facility], [place] on 06/18/2018. This over-read does not include interpretation of cardiac or coronary anatomy or pathology. The coronary calcium score interpretation by the cardiologist is attached. COMPARISON:  None. FINDINGS: Vascular: No incidental findings. Mediastinum/Nodes: Visualized mediastinum and hilar regions show no evidence of lymphadenopathy or masses. Lungs/Pleura: Visualized lungs show no evidence of pulmonary edema, consolidation, pneumothorax, nodule or pleural fluid. Upper Abdomen: No acute abnormality. Musculoskeletal: Mild rightward convex scoliosis. Other: Bilateral gynecomastia. IMPRESSION: No significant incidental findings.  Bilateral gynecomastia present. Electronically Signed: By: Aletta Edouard M.D. On: 06/18/2018 17:02    Assessment & Plan:     Follow-up: No follow-ups on file.  Walker Kehr, MD

## 2019-09-23 NOTE — Assessment & Plan Note (Signed)
Adderall Potential benefits of a long term stimulants use as well as potential risks  and complications were explained to the patient and were aknowledged. 

## 2019-11-10 ENCOUNTER — Other Ambulatory Visit: Payer: Self-pay | Admitting: Internal Medicine

## 2019-11-22 ENCOUNTER — Other Ambulatory Visit: Payer: Self-pay | Admitting: Internal Medicine

## 2019-12-11 ENCOUNTER — Other Ambulatory Visit: Payer: Self-pay | Admitting: Internal Medicine

## 2019-12-19 ENCOUNTER — Encounter: Payer: Self-pay | Admitting: Gastroenterology

## 2019-12-24 ENCOUNTER — Ambulatory Visit: Payer: 59 | Admitting: Internal Medicine

## 2019-12-24 ENCOUNTER — Other Ambulatory Visit: Payer: Self-pay

## 2019-12-24 ENCOUNTER — Encounter: Payer: Self-pay | Admitting: Internal Medicine

## 2019-12-24 VITALS — BP 122/64 | HR 60 | Temp 98.8°F | Ht 78.0 in | Wt 240.0 lb

## 2019-12-24 DIAGNOSIS — I10 Essential (primary) hypertension: Secondary | ICD-10-CM

## 2019-12-24 DIAGNOSIS — E291 Testicular hypofunction: Secondary | ICD-10-CM

## 2019-12-24 DIAGNOSIS — E785 Hyperlipidemia, unspecified: Secondary | ICD-10-CM

## 2019-12-24 MED ORDER — AMPHETAMINE-DEXTROAMPHETAMINE 10 MG PO TABS: 10.0000 mg | ORAL_TABLET | Freq: Two times a day (BID) | ORAL | 0 refills | Status: DC

## 2019-12-24 MED ORDER — TESTOSTERONE CYPIONATE 200 MG/ML IM SOLN: INTRAMUSCULAR | 1 refills | Status: DC

## 2019-12-24 NOTE — Progress Notes (Signed)
Subjective:  Patient ID: Victor Montes, male    DOB: [birth year]  Age: 64 y.o. MRN: [id number]  CC: No chief complaint on file.   HPI Victor Montes presents for ADD, asthma, hypogonadism f/u  Outpatient Medications Prior to Visit  Medication Sig Dispense Refill  . albuterol (VENTOLIN HFA) 108 (90 Base) MCG/ACT inhaler USE 2 INHALATIONS ORALLY   EVERY 6 HOURS AS NEEDED FORWHEEZING OR SHORTNESS OF   BREATH 17 g 5  . amphetamine-dextroamphetamine (ADDERALL) 10 MG tablet Take 1 tablet (10 mg total) by mouth 2 (two) times daily. 60 tablet 0  . amphetamine-dextroamphetamine (ADDERALL) 10 MG tablet Take 1 tablet (10 mg total) by mouth 2 (two) times daily. 60 tablet 0  . amphetamine-dextroamphetamine (ADDERALL) 10 MG tablet Take 1 tablet (10 mg total) by mouth 2 (two) times daily. 60 tablet 0  . b complex vitamins tablet Take 1 tablet by mouth daily.    Marland Kitchen BREO ELLIPTA 100-25 MCG/INH AEPB USE 1 INHALATION ORALLY    DAILY 1 each 3  . cetirizine (ZYRTEC) 10 MG tablet Take 10 mg by mouth daily.    . cholecalciferol (VITAMIN D) 1000 UNITS tablet Take 1,000 Units by mouth daily.      . Coenzyme Q10 (CO Q 10 PO) Take 1 tablet by mouth daily.    Marland Kitchen gabapentin (NEURONTIN) 300 MG capsule Take 1 capsule (300 mg total) by mouth 3 (three) times daily. 270 capsule 1  . guaiFENesin (MUCINEX) 600 MG 12 hr tablet Take 600 mg by mouth daily with breakfast.    . MAGNESIUM PO Take 1 tablet by mouth daily.    . Multiple Vitamin (MULTIVITAMIN WITH MINERALS) TABS tablet Take 1 tablet by mouth daily.    . nabumetone (RELAFEN) 500 MG tablet Take 1-2 tablets (500-1,000 mg total) by mouth daily as needed. 60 tablet 3  . Probiotic Product (PROBIOTIC DAILY) CAPS Take 1 capsule by mouth daily.    . sertraline (ZOLOFT) 50 MG tablet TAKE 1 TABLET DAILY 90 tablet 0  . SYRINGE-NEEDLE, DISP, 3 ML (BD ECLIPSE SYRINGE) 25G X 1" 3 ML MISC USE AS DIRECTED FOR IM INJECTION 50 each 11  . tadalafil (CIALIS) 5 MG tablet TAKE 1 TABLET  DAILY 90 tablet 3  . testosterone cypionate (DEPOTESTOSTERONE CYPIONATE) 200 MG/ML injection INJECT 2 ML (CC) INTRAMUSCULARLY EVERY TWO WEEKS 10 mL 0  . Vitamin D, Ergocalciferol, (DRISDOL) 50000 units CAPS capsule Take 1 capsule (50,000 Units total) by mouth every 7 (seven) days. 12 capsule 0   Facility-Administered Medications Prior to Visit  Medication Dose Route Frequency Provider Last Rate Last Admin  . testosterone cypionate (DEPOTESTOTERONE CYPIONATE) injection 200 mg  200 mg Intramuscular Q14 Days Kandance Yano, Evie Lacks, MD   200 mg at 01/18/12 0933    ROS: Review of Systems  Constitutional: Negative for appetite change, fatigue and unexpected weight change.  HENT: Negative for congestion, nosebleeds, sneezing, sore throat and trouble swallowing.   Eyes: Negative for itching and visual disturbance.  Respiratory: Negative for cough.   Cardiovascular: Negative for chest pain, palpitations and leg swelling.  Gastrointestinal: Negative for abdominal distention, blood in stool, diarrhea and nausea.  Genitourinary: Negative for frequency and hematuria.  Musculoskeletal: Negative for back pain, gait problem, joint swelling and neck pain.  Skin: Negative for rash.  Neurological: Negative for dizziness, tremors, speech difficulty and weakness.  Psychiatric/Behavioral: Negative for agitation, dysphoric mood, sleep disturbance and suicidal ideas. The patient is not nervous/anxious.     Objective:  BP 122/64 (  BP Location: Left Arm, Patient Position: Sitting, Cuff Size: Large)   Pulse 60   Temp 98.8 F (37.1 C) (Oral)   Ht 6\' 6"  (1.981 m)   Wt 240 lb (108.9 kg)   SpO2 96%   BMI 27.73 kg/m   BP Readings from Last 3 Encounters:  12/24/19 122/64  09/23/19 124/72  04/08/19 124/68    Wt Readings from Last 3 Encounters:  12/24/19 240 lb (108.9 kg)  09/23/19 237 lb (107.5 kg)  04/08/19 213 lb (96.6 kg)    Physical Exam Constitutional:      General: He is not in acute distress.     Appearance: He is well-developed.     Comments: NAD  Eyes:     Conjunctiva/sclera: Conjunctivae normal.     Pupils: Pupils are equal, round, and reactive to light.  Neck:     Thyroid: No thyromegaly.     Vascular: No JVD.  Cardiovascular:     Rate and Rhythm: Normal rate and regular rhythm.     Heart sounds: Normal heart sounds. No murmur heard.  No friction rub. No gallop.   Pulmonary:     Effort: Pulmonary effort is normal. No respiratory distress.     Breath sounds: Normal breath sounds. No wheezing or rales.  Chest:     Chest wall: No tenderness.  Abdominal:     General: Bowel sounds are normal. There is no distension.     Palpations: Abdomen is soft. There is no mass.     Tenderness: There is no abdominal tenderness. There is no guarding or rebound.  Musculoskeletal:        General: No tenderness. Normal range of motion.     Cervical back: Normal range of motion.  Lymphadenopathy:     Cervical: No cervical adenopathy.  Skin:    General: Skin is warm and dry.     Findings: No rash.  Neurological:     Mental Status: He is alert and oriented to person, place, and time.     Cranial Nerves: No cranial nerve deficit.     Motor: No abnormal muscle tone.     Coordination: Coordination normal.     Gait: Gait normal.     Deep Tendon Reflexes: Reflexes are normal and symmetric.  Psychiatric:        Behavior: Behavior normal.        Thought Content: Thought content normal.        Judgment: Judgment normal.     Lab Results  Component Value Date   WBC 8.5 09/23/2019   HGB 15.3 09/23/2019   HCT 45.5 09/23/2019   PLT 341.0 09/23/2019   GLUCOSE 98 09/23/2019   CHOL 191 09/23/2019   TRIG 321.0 (H) 09/23/2019   HDL 48.60 09/23/2019   LDLDIRECT 112.0 09/23/2019   LDLCALC 105 (H) 04/08/2019   ALT 27 09/23/2019   AST 19 09/23/2019   NA 140 09/23/2019   K 4.5 09/23/2019   CL 103 09/23/2019   CREATININE 0.98 09/23/2019   BUN 18 09/23/2019   CO2 30 09/23/2019   TSH 0.85  09/23/2019   PSA 1.06 09/23/2019   HGBA1C 5.6 07/11/2017    CT CARDIAC SCORING  Addendum Date: 06/18/2018   ADDENDUM REPORT: 06/18/2018 17:12 CLINICAL DATA:  Risk stratification EXAM: Coronary Calcium Score TECHNIQUE: The patient was scanned on a Siemens Somatom 64 slice scanner. Axial non-contrast 3 mm slices were carried out through the heart. The data set was analyzed on a dedicated work station and  scored using the Agatson method. FINDINGS: Non-cardiac: See separate report from [facility]. Ascending aorta: Normal diameter 3.5 cm Pericardium: Normal Coronary arteries: No calcium noted IMPRESSION: Coronary calcium score of 0. Jenkins Rouge Electronically Signed   By: Jenkins Rouge M.D.   On: 06/18/2018 17:12   Result Date: 06/18/2018 EXAM: OVER-READ INTERPRETATION  CT CHEST The following report is an over-read performed by radiologist Dr. Aletta Edouard of [facility], [place] on 06/18/2018. This over-read does not include interpretation of cardiac or coronary anatomy or pathology. The coronary calcium score interpretation by the cardiologist is attached. COMPARISON:  None. FINDINGS: Vascular: No incidental findings. Mediastinum/Nodes: Visualized mediastinum and hilar regions show no evidence of lymphadenopathy or masses. Lungs/Pleura: Visualized lungs show no evidence of pulmonary edema, consolidation, pneumothorax, nodule or pleural fluid. Upper Abdomen: No acute abnormality. Musculoskeletal: Mild rightward convex scoliosis. Other: Bilateral gynecomastia. IMPRESSION: No significant incidental findings.  Bilateral gynecomastia present. Electronically Signed: By: Aletta Edouard M.D. On: 06/18/2018 17:02    Assessment & Plan:    Walker Kehr, MD

## 2020-01-28 ENCOUNTER — Ambulatory Visit (AMBULATORY_SURGERY_CENTER): Payer: Self-pay | Admitting: *Deleted

## 2020-01-28 ENCOUNTER — Other Ambulatory Visit: Payer: Self-pay

## 2020-01-28 VITALS — Ht 78.0 in | Wt 232.0 lb

## 2020-01-28 DIAGNOSIS — Z86010 Personal history of colonic polyps: Secondary | ICD-10-CM

## 2020-01-28 NOTE — Progress Notes (Signed)
cov vax x 2   No egg or soy allergy known to patient  No issues with past sedation with any surgeries or procedures no intubation problems in the past  No FH of Malignant Hyperthermia No diet pills per patient No home 02 use per patient  No blood thinners per patient  Pt denies issues with constipation- states past issue not curretn issue   No A fib or A flutter  EMMI video to pt or via [place] 19 guidelines implemented in PV today with Pt and RN    Due to the COVID-19 pandemic we are asking patients to follow these guidelines. Please only bring one care partner. Please be aware that your care partner may wait in the car in the parking lot or if they feel like they will be too hot to wait in the car, they may wait in the lobby on the 4th floor. All care partners are required to wear a mask the entire time (we do not have any that we can provide them), they need to practice social distancing, and we will do a Covid check for all patient's and care partners when you arrive. Also we will check their temperature and your temperature. If the care partner waits in their car they need to stay in the parking lot the entire time and we will call them on their cell phone when the patient is ready for discharge so they can bring the car to the front of the building. Also all patient's will need to wear a mask into building.

## 2020-02-11 ENCOUNTER — Encounter: Payer: Self-pay | Admitting: Gastroenterology

## 2020-02-11 ENCOUNTER — Ambulatory Visit (AMBULATORY_SURGERY_CENTER): Payer: 59 | Admitting: Gastroenterology

## 2020-02-11 ENCOUNTER — Other Ambulatory Visit: Payer: Self-pay

## 2020-02-11 VITALS — BP 107/74 | HR 58 | Temp 97.5°F | Resp 14 | Ht 78.0 in | Wt 232.0 lb

## 2020-02-11 DIAGNOSIS — D124 Benign neoplasm of descending colon: Secondary | ICD-10-CM | POA: Diagnosis not present

## 2020-02-11 DIAGNOSIS — D125 Benign neoplasm of sigmoid colon: Secondary | ICD-10-CM | POA: Diagnosis not present

## 2020-02-11 DIAGNOSIS — Z86010 Personal history of colonic polyps: Secondary | ICD-10-CM | POA: Diagnosis not present

## 2020-02-11 DIAGNOSIS — D120 Benign neoplasm of cecum: Secondary | ICD-10-CM

## 2020-02-11 DIAGNOSIS — D123 Benign neoplasm of transverse colon: Secondary | ICD-10-CM | POA: Diagnosis not present

## 2020-02-11 MED ORDER — SODIUM CHLORIDE 0.9 % IV SOLN: 500.0000 mL | Freq: Once | INTRAVENOUS | Status: DC

## 2020-02-11 NOTE — Progress Notes (Signed)
Pt's states no medical or surgical changes since previsit or office visit. 

## 2020-02-11 NOTE — Patient Instructions (Signed)
Handouts provided on polyps, diverticulosis and hemorrhoids.   YOU HAD AN ENDOSCOPIC PROCEDURE TODAY AT THE [facility]:   Refer to the procedure report that was given to you for any specific questions about what was found during the examination.  If the procedure report does not answer your questions, please call your gastroenterologist to clarify.  If you requested that your care partner not be given the details of your procedure findings, then the procedure report has been included in a sealed envelope for you to review at your convenience later.  YOU SHOULD EXPECT: Some feelings of bloating in the abdomen. Passage of more gas than usual.  Walking can help get rid of the air that was put into your GI tract during the procedure and reduce the bloating. If you had a lower endoscopy (such as a colonoscopy or flexible sigmoidoscopy) you may notice spotting of blood in your stool or on the toilet paper. If you underwent a bowel prep for your procedure, you may not have a normal bowel movement for a few days.  Please Note:  You might notice some irritation and congestion in your nose or some drainage.  This is from the oxygen used during your procedure.  There is no need for concern and it should clear up in a day or so.  SYMPTOMS TO REPORT IMMEDIATELY:   Following lower endoscopy (colonoscopy or flexible sigmoidoscopy):  Excessive amounts of blood in the stool  Significant tenderness or worsening of abdominal pains  Swelling of the abdomen that is new, acute  Fever of 100F or higher   For urgent or emergent issues, a gastroenterologist can be reached at any hour by calling [phone]. Do not use MyChart messaging for urgent concerns.    DIET:  We do recommend a small meal at first, but then you may proceed to your regular diet.  Drink plenty of fluids but you should avoid alcoholic beverages for 24 hours.  ACTIVITY:  You should plan to take it easy for the rest of today and  you should NOT DRIVE or use heavy machinery until tomorrow (because of the sedation medicines used during the test).    FOLLOW UP: Our staff will call the number listed on your records 48-72 hours following your procedure to check on you and address any questions or concerns that you may have regarding the information given to you following your procedure. If we do not reach you, we will leave a message.  We will attempt to reach you two times.  During this call, we will ask if you have developed any symptoms of COVID 19. If you develop any symptoms (ie: fever, flu-like symptoms, shortness of breath, cough etc.) before then, please call [phone].  If you test positive for Covid 19 in the 2 weeks post procedure, please call and report this information to us.    If any biopsies were taken you will be contacted by phone or by letter within the next 1-3 weeks.  Please call us at [phone] if you have not heard about the biopsies in 3 weeks.    SIGNATURES/CONFIDENTIALITY: You and/or your care partner have signed paperwork which will be entered into your electronic medical record.  These signatures attest to the fact that that the information above on your After Visit Summary has been reviewed and is understood.  Full responsibility of the confidentiality of this discharge information lies with you and/or your care-partner.  

## 2020-02-11 NOTE — Op Note (Signed)
[facility] Patient Name: Victor Montes Procedure Date: 02/11/2020 9:00 AM MRN: [id number] Endoscopist: Milus Banister , MD Age: 64 Referring MD:  Date of Birth: 1956-03-16 Gender: Male Account #: [id number] Procedure:                Colonoscopy Indications:              High risk colon cancer surveillance: Personal                            history of colonic polyps and FH of colon cancer                            (mother in her early 75s), Colonoscopy 2011 three                            subCM polyps, one was a TA Medicines:                Monitored Anesthesia Care Procedure:                Pre-Anesthesia Assessment:                           - Prior to the procedure, a History and Physical                            was performed, and patient medications and                            allergies were reviewed. The patient's tolerance of                            previous anesthesia was also reviewed. The risks                            and benefits of the procedure and the sedation                            options and risks were discussed with the patient.                            All questions were answered, and informed consent                            was obtained. Prior Anticoagulants: The patient has                            taken no previous anticoagulant or antiplatelet                            agents. ASA Grade Assessment: II - A patient with                            mild systemic disease. After reviewing the risks  and benefits, the patient was deemed in                            satisfactory condition to undergo the procedure.                           After obtaining informed consent, the colonoscope                            was passed under direct vision. Throughout the                            procedure, the patient's blood pressure, pulse, and                            oxygen saturations were monitored  continuously. The                            Colonoscope was introduced through the anus and                            advanced to the the cecum, identified by                            appendiceal orifice and ileocecal valve. The                            colonoscopy was performed without difficulty. The                            patient tolerated the procedure well. The quality                            of the bowel preparation was good. The ileocecal                            valve, appendiceal orifice, and rectum were                            photographed. Scope In: 9:02:47 AM Scope Out: 9:31:01 AM Scope Withdrawal Time: 0 hours 14 minutes 10 seconds  Total Procedure Duration: 0 hours 28 minutes 14 seconds  Findings:                 Seven sessile polyps were found in the sigmoid                            colon, descending colon, transverse colon and                            cecum. The polyps were 3 to 12 mm in size. The                            largest was located in the transverse. These polyps  were removed with a cold snare. Resection and                            retrieval were complete.                           Multiple small and large-mouthed diverticula were                            found in the left colon.                           External and internal hemorrhoids were found. The                            hemorrhoids were medium-sized.                           The exam was otherwise without abnormality on                            direct and retroflexion views. Complications:            No immediate complications. Estimated blood loss:                            None. Estimated Blood Loss:     Estimated blood loss: none. Impression:               - Seven 3 to 12 mm polyps in the sigmoid colon, in                            the descending colon, in the transverse colon and                            in the cecum, removed with a  cold snare. Resected                            and retrieved.                           - Diverticulosis in the left colon.                           - External and internal hemorrhoids.                           - The examination was otherwise normal on direct                            and retroflexion views. Recommendation:           - Patient has a contact number available for                            emergencies. The signs and symptoms of potential  delayed complications were discussed with the                            patient. Return to normal activities tomorrow.                            Written discharge instructions were provided to the                            patient.                           - Resume previous diet.                           - Continue present medications.                           - Await pathology results. Milus Banister, MD 02/11/2020 9:35:21 AM This report has been signed electronically.

## 2020-02-11 NOTE — Progress Notes (Signed)
Called to room to assist during endoscopic procedure.  Patient ID and intended procedure confirmed with present staff. Received instructions for my participation in the procedure from the performing physician.  

## 2020-02-11 NOTE — Progress Notes (Signed)
PT taken to PACU. Monitors in place. VSS. Report given to RN. 

## 2020-02-13 ENCOUNTER — Telehealth: Payer: Self-pay | Admitting: *Deleted

## 2020-02-13 ENCOUNTER — Other Ambulatory Visit: Payer: Self-pay | Admitting: Internal Medicine

## 2020-02-13 NOTE — Telephone Encounter (Signed)
1. Have you developed a fever since your procedure? no  2.   Have you had an respiratory symptoms (SOB or cough) since your procedure? no  3.   Have you tested positive for COVID 19 since your procedure no  4.   Have you had any family members/close contacts diagnosed with the COVID 19 since your procedure?  no   If yes to any of these questions please route to Joylene John, RN and Joella Prince, RN Follow up Call-  Call back number 02/11/2020  Post procedure Call Back phone  # [phone]  Permission to leave phone message Yes  Some recent data might be hidden     Patient questions:  Do you have a fever, pain , or abdominal swelling? No. Pain Score  0 *  Have you tolerated food without any problems? Yes.    Have you been able to return to your normal activities? Yes.    Do you have any questions about your discharge instructions: Diet   No. Medications  No. Follow up visit  No.  Do you have questions or concerns about your Care? No.  Actions: * If pain score is 4 or above: No action needed, pain <4.

## 2020-02-18 ENCOUNTER — Encounter: Payer: Self-pay | Admitting: Gastroenterology

## 2020-03-25 ENCOUNTER — Ambulatory Visit: Payer: 59 | Admitting: Internal Medicine

## 2020-03-30 ENCOUNTER — Encounter: Payer: Self-pay | Admitting: Internal Medicine

## 2020-03-30 ENCOUNTER — Ambulatory Visit (INDEPENDENT_AMBULATORY_CARE_PROVIDER_SITE_OTHER): Payer: Medicare Other | Admitting: Internal Medicine

## 2020-03-30 ENCOUNTER — Other Ambulatory Visit: Payer: Self-pay

## 2020-03-30 VITALS — BP 132/82 | HR 67 | Temp 98.5°F | Wt 239.2 lb

## 2020-03-30 DIAGNOSIS — N528 Other male erectile dysfunction: Secondary | ICD-10-CM | POA: Diagnosis not present

## 2020-03-30 DIAGNOSIS — I10 Essential (primary) hypertension: Secondary | ICD-10-CM | POA: Diagnosis not present

## 2020-03-30 DIAGNOSIS — E739 Lactose intolerance, unspecified: Secondary | ICD-10-CM | POA: Diagnosis not present

## 2020-03-30 DIAGNOSIS — E291 Testicular hypofunction: Secondary | ICD-10-CM

## 2020-03-30 DIAGNOSIS — Z23 Encounter for immunization: Secondary | ICD-10-CM | POA: Diagnosis not present

## 2020-03-30 DIAGNOSIS — F988 Other specified behavioral and emotional disorders with onset usually occurring in childhood and adolescence: Secondary | ICD-10-CM

## 2020-03-30 DIAGNOSIS — N3281 Overactive bladder: Secondary | ICD-10-CM | POA: Diagnosis not present

## 2020-03-30 LAB — COMPREHENSIVE METABOLIC PANEL
ALT: 23 U/L (ref 0–53)
AST: 17 U/L (ref 0–37)
Albumin: 4.5 g/dL (ref 3.5–5.2)
Alkaline Phosphatase: 66 U/L (ref 39–117)
BUN: 14 mg/dL (ref 6–23)
CO2: 30 mEq/L (ref 19–32)
Calcium: 9.3 mg/dL (ref 8.4–10.5)
Chloride: 102 mEq/L (ref 96–112)
Creatinine, Ser: 0.97 mg/dL (ref 0.40–1.50)
GFR: 81.33 mL/min (ref >60.00)
Glucose, Bld: 96 mg/dL (ref 70–99)
Potassium: 5.3 mEq/L — ABNORMAL HIGH (ref 3.5–5.1)
Sodium: 138 mEq/L (ref 135–145)
Total Bilirubin: 0.8 mg/dL (ref 0.2–1.2)
Total Protein: 6.7 g/dL (ref 6.0–8.3)

## 2020-03-30 LAB — CBC WITH DIFFERENTIAL/PLATELET
Basophils Absolute: 0.1 10*3/uL (ref 0.0–0.1)
Basophils Relative: 0.8 % (ref 0.0–3.0)
Eosinophils Absolute: 0 10*3/uL (ref 0.0–0.7)
Eosinophils Relative: 0.4 % (ref 0.0–5.0)
HCT: 44.4 % (ref 39.0–52.0)
Hemoglobin: 14.7 g/dL (ref 13.0–17.0)
Lymphocytes Relative: 14.4 % (ref 12.0–46.0)
Lymphs Abs: 1.3 10*3/uL (ref 0.7–4.0)
MCHC: 33.2 g/dL (ref 30.0–36.0)
MCV: 88.5 fl (ref 78.0–100.0)
Monocytes Absolute: 0.9 10*3/uL (ref 0.1–1.0)
Monocytes Relative: 10.3 % (ref 3.0–12.0)
Neutro Abs: 6.7 10*3/uL (ref 1.4–7.7)
Neutrophils Relative %: 74.1 % (ref 43.0–77.0)
Platelets: 339 10*3/uL (ref 150.0–400.0)
RBC: 5.01 Mil/uL (ref 4.22–5.81)
RDW: 14.7 % (ref 11.5–15.5)
WBC: 9.1 10*3/uL (ref 4.0–10.5)

## 2020-03-30 LAB — TSH: TSH: 0.96 u[IU]/mL (ref 0.35–4.50)

## 2020-03-30 LAB — PSA: PSA: 1.19 ng/mL (ref 0.10–4.00)

## 2020-03-30 LAB — TESTOSTERONE: Testosterone: 957.2 ng/dL — ABNORMAL HIGH (ref 300.00–890.00)

## 2020-03-30 MED ORDER — TESTOSTERONE CYPIONATE 200 MG/ML IM SOLN
INTRAMUSCULAR | 1 refills | Status: DC
Start: 2020-03-30 — End: 2020-07-09

## 2020-03-30 MED ORDER — SERTRALINE HCL 50 MG PO TABS
50.0000 mg | ORAL_TABLET | Freq: Every day | ORAL | 3 refills | Status: DC
Start: 2020-03-30 — End: 2021-01-11

## 2020-03-30 MED ORDER — TAMSULOSIN HCL 0.4 MG PO CAPS: 0.4000 mg | ORAL_CAPSULE | Freq: Every day | ORAL | 3 refills | Status: DC

## 2020-03-30 MED ORDER — TADALAFIL 5 MG PO TABS
5.0000 mg | ORAL_TABLET | Freq: Every day | ORAL | 3 refills | Status: DC
Start: 2020-03-30 — End: 2020-12-27

## 2020-03-30 MED ORDER — AMPHETAMINE-DEXTROAMPHETAMINE 10 MG PO TABS: 10.0000 mg | ORAL_TABLET | Freq: Two times a day (BID) | ORAL | 0 refills | Status: DC

## 2020-03-30 NOTE — Assessment & Plan Note (Signed)
Losartan 

## 2020-03-30 NOTE — Assessment & Plan Note (Signed)
Labs

## 2020-03-30 NOTE — Assessment & Plan Note (Signed)
Added Flomax

## 2020-03-30 NOTE — Assessment & Plan Note (Signed)
Cialis daily

## 2020-03-30 NOTE — Addendum Note (Signed)
Addended by: Boris Lown B on: 03/30/2020 09:07 AM   Modules accepted: Orders

## 2020-03-30 NOTE — Progress Notes (Signed)
Subjective:  Patient ID: Victor Montes, male    DOB: December 11, 1955  Age: 64 y.o. MRN: [id number]  CC: Follow-up (3 month F/U- Flu shot)   HPI Victor Montes presents for depression, apathy, ED C/o BPH - worse  Outpatient Medications Prior to Visit  Medication Sig Dispense Refill  . albuterol (VENTOLIN HFA) 108 (90 Base) MCG/ACT inhaler USE 2 INHALATIONS ORALLY   EVERY 6 HOURS AS NEEDED FORWHEEZING OR SHORTNESS OF   BREATH 17 g 5  . amphetamine-dextroamphetamine (ADDERALL) 10 MG tablet Take 1 tablet (10 mg total) by mouth 2 (two) times daily. 60 tablet 0  . b complex vitamins tablet Take 1 tablet by mouth daily.    Marland Kitchen BREO ELLIPTA 100-25 MCG/INH AEPB USE 1 INHALATION ORALLY    DAILY 1 each 3  . cetirizine (ZYRTEC) 10 MG tablet Take 10 mg by mouth daily.    . cholecalciferol (VITAMIN D) 1000 UNITS tablet Take 1,000 Units by mouth daily.      . Coenzyme Q10 (CO Q 10 PO) Take 1 tablet by mouth daily.    . Ginkgo Biloba Extract (GNP GINGKO BILOBA EXTRACT) 60 MG CAPS Take by mouth.    Marland Kitchen guaiFENesin (MUCINEX) 600 MG 12 hr tablet Take 600 mg by mouth daily with breakfast.    . KRILL OIL PO Take by mouth.    Marland Kitchen MAGNESIUM PO Take 1 tablet by mouth daily.    . Multiple Vitamin (MULTIVITAMIN WITH MINERALS) TABS tablet Take 1 tablet by mouth daily.    . Multiple Vitamins-Minerals (VITAMIN D3 COMPLETE PO) Take by mouth.    . nabumetone (RELAFEN) 500 MG tablet Take 1-2 tablets (500-1,000 mg total) by mouth daily as needed. 60 tablet 3  . Probiotic Product (PROBIOTIC DAILY) CAPS Take 1 capsule by mouth daily.     . sertraline (ZOLOFT) 50 MG tablet TAKE 1 TABLET DAILY 90 tablet 1  . SYRINGE-NEEDLE, DISP, 3 ML (BD ECLIPSE SYRINGE) 25G X 1" 3 ML MISC USE AS DIRECTED FOR IM INJECTION 50 each 11  . tadalafil (CIALIS) 5 MG tablet TAKE 1 TABLET DAILY 90 tablet 3  . testosterone cypionate (DEPOTESTOSTERONE CYPIONATE) 200 MG/ML injection INJECT 2 ML (CC) INTRAMUSCULARLY EVERY TWO WEEKS 10 mL 1  .  amphetamine-dextroamphetamine (ADDERALL) 10 MG tablet Take 1 tablet (10 mg total) by mouth 2 (two) times daily. 60 tablet 0  . amphetamine-dextroamphetamine (ADDERALL) 10 MG tablet Take 1 tablet (10 mg total) by mouth 2 (two) times daily. 60 tablet 0  . gabapentin (NEURONTIN) 300 MG capsule Take 1 capsule (300 mg total) by mouth 3 (three) times daily. (Patient not taking: Reported on 01/28/2020) 270 capsule 1  . Vitamin D, Ergocalciferol, (DRISDOL) 50000 units CAPS capsule Take 1 capsule (50,000 Units total) by mouth every 7 (seven) days. (Patient not taking: Reported on 03/30/2020) 12 capsule 0  . testosterone cypionate (DEPOTESTOTERONE CYPIONATE) injection 200 mg      No facility-administered medications prior to visit.    ROS: Review of Systems  Constitutional: Negative for appetite change, fatigue and unexpected weight change.  HENT: Negative for congestion, nosebleeds, sneezing, sore throat and trouble swallowing.   Eyes: Negative for itching and visual disturbance.  Respiratory: Negative for cough.   Cardiovascular: Negative for chest pain, palpitations and leg swelling.  Gastrointestinal: Negative for abdominal distention, blood in stool, diarrhea and nausea.  Genitourinary: Negative for frequency and hematuria.  Musculoskeletal: Negative for back pain, gait problem, joint swelling and neck pain.  Skin: Negative for rash.  Neurological: Negative for dizziness, tremors, speech difficulty and weakness.  Psychiatric/Behavioral: Positive for dysphoric mood. Negative for agitation, sleep disturbance and suicidal ideas. The patient is not nervous/anxious.     Objective:  BP 132/82 (BP Location: Left Arm)   Pulse 67   Temp 98.5 F (36.9 C) (Oral)   Wt 239 lb 3.2 oz (108.5 kg)   SpO2 99%   BMI 27.64 kg/m   BP Readings from Last 3 Encounters:  03/30/20 132/82  02/11/20 107/74  12/24/19 122/64    Wt Readings from Last 3 Encounters:  03/30/20 239 lb 3.2 oz (108.5 kg)  02/11/20 232  lb (105.2 kg)  01/28/20 232 lb (105.2 kg)    Physical Exam Constitutional:      General: He is not in acute distress.    Appearance: He is well-developed.     Comments: NAD  Eyes:     Conjunctiva/sclera: Conjunctivae normal.     Pupils: Pupils are equal, round, and reactive to light.  Neck:     Thyroid: No thyromegaly.     Vascular: No JVD.  Cardiovascular:     Rate and Rhythm: Normal rate and regular rhythm.     Heart sounds: Normal heart sounds. No murmur heard.  No friction rub. No gallop.   Pulmonary:     Effort: Pulmonary effort is normal. No respiratory distress.     Breath sounds: Normal breath sounds. No wheezing or rales.  Chest:     Chest wall: No tenderness.  Abdominal:     General: Bowel sounds are normal. There is no distension.     Palpations: Abdomen is soft. There is no mass.     Tenderness: There is no abdominal tenderness. There is no guarding or rebound.  Musculoskeletal:        General: No tenderness. Normal range of motion.     Cervical back: Normal range of motion.  Lymphadenopathy:     Cervical: No cervical adenopathy.  Skin:    General: Skin is warm and dry.     Findings: No rash.  Neurological:     Mental Status: He is alert and oriented to person, place, and time.     Cranial Nerves: No cranial nerve deficit.     Motor: No abnormal muscle tone.     Coordination: Coordination normal.     Gait: Gait normal.     Deep Tendon Reflexes: Reflexes are normal and symmetric.  Psychiatric:        Behavior: Behavior normal.        Thought Content: Thought content normal.        Judgment: Judgment normal.     Lab Results  Component Value Date   WBC 8.5 09/23/2019   HGB 15.3 09/23/2019   HCT 45.5 09/23/2019   PLT 341.0 09/23/2019   GLUCOSE 98 09/23/2019   CHOL 191 09/23/2019   TRIG 321.0 (H) 09/23/2019   HDL 48.60 09/23/2019   LDLDIRECT 112.0 09/23/2019   LDLCALC 105 (H) 04/08/2019   ALT 27 09/23/2019   AST 19 09/23/2019   NA 140 09/23/2019    K 4.5 09/23/2019   CL 103 09/23/2019   CREATININE 0.98 09/23/2019   BUN 18 09/23/2019   CO2 30 09/23/2019   TSH 0.85 09/23/2019   PSA 1.06 09/23/2019   HGBA1C 5.6 07/11/2017    CT CARDIAC SCORING  Addendum Date: 06/18/2018   ADDENDUM REPORT: 06/18/2018 17:12 CLINICAL DATA:  Risk stratification EXAM: Coronary Calcium Score TECHNIQUE: The patient was scanned on a  Siemens Somatom 64 slice scanner. Axial non-contrast 3 mm slices were carried out through the heart. The data set was analyzed on a dedicated work station and scored using the [place]. FINDINGS: Non-cardiac: See separate report from [facility]. Ascending aorta: Normal diameter 3.5 cm Pericardium: Normal Coronary arteries: No calcium noted IMPRESSION: Coronary calcium score of 0. Jenkins Rouge Electronically Signed   By: Jenkins Rouge M.D.   On: 06/18/2018 17:12   Result Date: 06/18/2018 EXAM: OVER-READ INTERPRETATION  CT CHEST The following report is an over-read performed by radiologist Dr. Aletta Edouard of [facility], [place] on 06/18/2018. This over-read does not include interpretation of cardiac or coronary anatomy or pathology. The coronary calcium score interpretation by the cardiologist is attached. COMPARISON:  None. FINDINGS: Vascular: No incidental findings. Mediastinum/Nodes: Visualized mediastinum and hilar regions show no evidence of lymphadenopathy or masses. Lungs/Pleura: Visualized lungs show no evidence of pulmonary edema, consolidation, pneumothorax, nodule or pleural fluid. Upper Abdomen: No acute abnormality. Musculoskeletal: Mild rightward convex scoliosis. Other: Bilateral gynecomastia. IMPRESSION: No significant incidental findings.  Bilateral gynecomastia present. Electronically Signed: By: Aletta Edouard M.D. On: 06/18/2018 17:02    Assessment & Plan:     Walker Kehr, MD

## 2020-03-30 NOTE — Assessment & Plan Note (Signed)
Adderall Potential benefits of a long term stimulants use as well as potential risks  and complications were explained to the patient and were aknowledged.

## 2020-03-30 NOTE — Patient Instructions (Signed)
You can try [place] Mushroom extract or capsules for memory   

## 2020-06-30 ENCOUNTER — Ambulatory Visit (INDEPENDENT_AMBULATORY_CARE_PROVIDER_SITE_OTHER): Payer: Medicare Other | Admitting: Internal Medicine

## 2020-06-30 ENCOUNTER — Encounter: Payer: Self-pay | Admitting: Internal Medicine

## 2020-06-30 ENCOUNTER — Other Ambulatory Visit: Payer: Self-pay

## 2020-06-30 VITALS — BP 148/90 | HR 52 | Temp 98.1°F | Ht 78.0 in | Wt 234.8 lb

## 2020-06-30 DIAGNOSIS — R634 Abnormal weight loss: Secondary | ICD-10-CM

## 2020-06-30 DIAGNOSIS — N3281 Overactive bladder: Secondary | ICD-10-CM | POA: Diagnosis not present

## 2020-06-30 DIAGNOSIS — R202 Paresthesia of skin: Secondary | ICD-10-CM | POA: Diagnosis not present

## 2020-06-30 DIAGNOSIS — R5383 Other fatigue: Secondary | ICD-10-CM | POA: Diagnosis not present

## 2020-06-30 DIAGNOSIS — I10 Essential (primary) hypertension: Secondary | ICD-10-CM

## 2020-06-30 LAB — VITAMIN B12: Vitamin B-12: 470 pg/mL (ref 211–911)

## 2020-06-30 LAB — CBC WITH DIFFERENTIAL/PLATELET
Basophils Absolute: 0.1 10*3/uL (ref 0.0–0.1)
Basophils Relative: 0.9 % (ref 0.0–3.0)
Eosinophils Absolute: 0.1 10*3/uL (ref 0.0–0.7)
Eosinophils Relative: 0.6 % (ref 0.0–5.0)
HCT: 45.9 % (ref 39.0–52.0)
Hemoglobin: 14.9 g/dL (ref 13.0–17.0)
Lymphocytes Relative: 16 % (ref 12.0–46.0)
Lymphs Abs: 1.3 10*3/uL (ref 0.7–4.0)
MCHC: 32.3 g/dL (ref 30.0–36.0)
MCV: 84 fl (ref 78.0–100.0)
Monocytes Absolute: 1 10*3/uL (ref 0.1–1.0)
Monocytes Relative: 12.5 % — ABNORMAL HIGH (ref 3.0–12.0)
Neutro Abs: 5.9 10*3/uL (ref 1.4–7.7)
Neutrophils Relative %: 70 % (ref 43.0–77.0)
Platelets: 375 10*3/uL (ref 150.0–400.0)
RBC: 5.47 Mil/uL (ref 4.22–5.81)
RDW: 16.5 % — ABNORMAL HIGH (ref 11.5–15.5)
WBC: 8.4 10*3/uL (ref 4.0–10.5)

## 2020-06-30 LAB — COMPREHENSIVE METABOLIC PANEL
ALT: 21 U/L (ref 0–53)
AST: 18 U/L (ref 0–37)
Albumin: 4.6 g/dL (ref 3.5–5.2)
Alkaline Phosphatase: 72 U/L (ref 39–117)
BUN: 17 mg/dL (ref 6–23)
CO2: 31 mEq/L (ref 19–32)
Calcium: 9.9 mg/dL (ref 8.4–10.5)
Chloride: 102 mEq/L (ref 96–112)
Creatinine, Ser: 1.13 mg/dL (ref 0.40–1.50)
GFR: 67.6 mL/min (ref >60.00)
Glucose, Bld: 97 mg/dL (ref 70–99)
Potassium: 4.7 mEq/L (ref 3.5–5.1)
Sodium: 138 mEq/L (ref 135–145)
Total Bilirubin: 0.9 mg/dL (ref 0.2–1.2)
Total Protein: 7.5 g/dL (ref 6.0–8.3)

## 2020-06-30 LAB — TSH: TSH: 0.87 u[IU]/mL (ref 0.35–4.50)

## 2020-06-30 NOTE — Assessment & Plan Note (Signed)
Resolved on Flomax

## 2020-06-30 NOTE — Assessment & Plan Note (Signed)
Losartan 

## 2020-06-30 NOTE — Progress Notes (Signed)
Subjective:  Patient ID: Victor Montes, male    DOB: Jan 04, 1956  Age: 65 y.o. MRN: [id number]  CC: Follow-up (3 MONTH F/U)   HPI Victor Montes presents for ADD,  C/o fatigue x 2 month Victor Montes has been grieving his dog's death.  Not too depressed now-doing fairly well.   Outpatient Medications Prior to Visit  Medication Sig Dispense Refill  . albuterol (VENTOLIN HFA) 108 (90 Base) MCG/ACT inhaler USE 2 INHALATIONS ORALLY   EVERY 6 HOURS AS NEEDED FORWHEEZING OR SHORTNESS OF   BREATH 17 g 5  . amphetamine-dextroamphetamine (ADDERALL) 10 MG tablet Take 1 tablet (10 mg total) by mouth 2 (two) times daily. 60 tablet 0  . b complex vitamins tablet Take 1 tablet by mouth daily.    Marland Kitchen BREO ELLIPTA 100-25 MCG/INH AEPB USE 1 INHALATION ORALLY    DAILY 1 each 3  . cetirizine (ZYRTEC) 10 MG tablet Take 10 mg by mouth daily.    . cholecalciferol (VITAMIN D) 1000 UNITS tablet Take 1,000 Units by mouth daily.    . Coenzyme Q10 (CO Q 10 PO) Take 1 tablet by mouth daily.    . Ginkgo Biloba Extract 60 MG CAPS Take by mouth.    Marland Kitchen guaiFENesin (MUCINEX) 600 MG 12 hr tablet Take 600 mg by mouth daily with breakfast.    . KRILL OIL PO Take by mouth.    Marland Kitchen MAGNESIUM PO Take 1 tablet by mouth daily.    . Multiple Vitamin (MULTIVITAMIN WITH MINERALS) TABS tablet Take 1 tablet by mouth daily.    . Multiple Vitamins-Minerals (VITAMIN D3 COMPLETE PO) Take by mouth.    . nabumetone (RELAFEN) 500 MG tablet Take 1-2 tablets (500-1,000 mg total) by mouth daily as needed. 60 tablet 3  . Probiotic Product (PROBIOTIC DAILY) CAPS Take 1 capsule by mouth daily.     . sertraline (ZOLOFT) 50 MG tablet Take 1 tablet (50 mg total) by mouth daily. 90 tablet 3  . SYRINGE-NEEDLE, DISP, 3 ML (BD ECLIPSE SYRINGE) 25G X 1" 3 ML MISC USE AS DIRECTED FOR IM INJECTION 50 each 11  . tadalafil (CIALIS) 5 MG tablet Take 1 tablet (5 mg total) by mouth daily. 90 tablet 3  . tamsulosin (FLOMAX) 0.4 MG CAPS capsule Take 1 capsule (0.4 mg total)  by mouth daily. 90 capsule 3  . testosterone cypionate (DEPOTESTOSTERONE CYPIONATE) 200 MG/ML injection INJECT 2 ML (CC) INTRAMUSCULARLY EVERY TWO WEEKS 10 mL 1  . amphetamine-dextroamphetamine (ADDERALL) 10 MG tablet Take 1 tablet (10 mg total) by mouth 2 (two) times daily. 60 tablet 0  . amphetamine-dextroamphetamine (ADDERALL) 10 MG tablet Take 1 tablet (10 mg total) by mouth 2 (two) times daily. 60 tablet 0   No facility-administered medications prior to visit.    ROS: Review of Systems  Constitutional: Positive for fatigue. Negative for appetite change and unexpected weight change.  HENT: Negative for congestion, nosebleeds, sneezing, sore throat and trouble swallowing.   Eyes: Negative for itching and visual disturbance.  Respiratory: Negative for cough.   Cardiovascular: Negative for chest pain, palpitations and leg swelling.  Gastrointestinal: Negative for abdominal distention, blood in stool, diarrhea and nausea.  Genitourinary: Negative for frequency and hematuria.  Musculoskeletal: Negative for back pain, gait problem, joint swelling and neck pain.  Skin: Negative for rash.  Neurological: Negative for dizziness, tremors, speech difficulty and weakness.  Psychiatric/Behavioral: Positive for decreased concentration. Negative for agitation, dysphoric mood, sleep disturbance and suicidal ideas. The patient is not nervous/anxious.  Objective:  BP (!) 148/90 (BP Location: Left Arm)   Pulse (!) 52   Temp 98.1 F (36.7 C) (Oral)   Ht 6\' 6"  (1.981 m)   Wt 234 lb 12.8 oz (106.5 kg)   SpO2 97%   BMI 27.13 kg/m   BP Readings from Last 3 Encounters:  06/30/20 (!) 148/90  03/30/20 132/82  02/11/20 107/74    Wt Readings from Last 3 Encounters:  06/30/20 234 lb 12.8 oz (106.5 kg)  03/30/20 239 lb 3.2 oz (108.5 kg)  02/11/20 232 lb (105.2 kg)    Physical Exam Constitutional:      General: He is not in acute distress.    Appearance: Normal appearance. He is  well-developed.     Comments: NAD  HENT:     Mouth/Throat:     Mouth: Oropharynx is clear and moist.  Eyes:     Conjunctiva/sclera: Conjunctivae normal.     Pupils: Pupils are equal, round, and reactive to light.  Neck:     Thyroid: No thyromegaly.     Vascular: No JVD.  Cardiovascular:     Rate and Rhythm: Normal rate and regular rhythm.     Pulses: Intact distal pulses.     Heart sounds: Normal heart sounds. No murmur heard. No friction rub. No gallop.   Pulmonary:     Effort: Pulmonary effort is normal. No respiratory distress.     Breath sounds: Normal breath sounds. No wheezing or rales.  Chest:     Chest wall: No tenderness.  Abdominal:     General: Bowel sounds are normal. There is no distension.     Palpations: Abdomen is soft. There is no mass.     Tenderness: There is no abdominal tenderness. There is no guarding or rebound.  Musculoskeletal:        General: No tenderness or edema. Normal range of motion.     Cervical back: Normal range of motion.  Lymphadenopathy:     Cervical: No cervical adenopathy.  Skin:    General: Skin is warm and dry.     Findings: No rash.  Neurological:     Mental Status: He is alert and oriented to person, place, and time.     Cranial Nerves: No cranial nerve deficit.     Motor: No abnormal muscle tone.     Coordination: He displays a negative Romberg sign. Coordination normal.     Gait: Gait normal.     Deep Tendon Reflexes: Reflexes are normal and symmetric.  Psychiatric:        Mood and Affect: Mood and affect normal.        Behavior: Behavior normal.        Thought Content: Thought content normal.        Judgment: Judgment normal.     Lab Results  Component Value Date   WBC 9.1 03/30/2020   HGB 14.7 03/30/2020   HCT 44.4 03/30/2020   PLT 339.0 03/30/2020   GLUCOSE 96 03/30/2020   CHOL 191 09/23/2019   TRIG 321.0 (H) 09/23/2019   HDL 48.60 09/23/2019   LDLDIRECT 112.0 09/23/2019   LDLCALC 105 (H) 04/08/2019   ALT 23  03/30/2020   AST 17 03/30/2020   NA 138 03/30/2020   K 5.3 (H) 03/30/2020   CL 102 03/30/2020   CREATININE 0.97 03/30/2020   BUN 14 03/30/2020   CO2 30 03/30/2020   TSH 0.96 03/30/2020   PSA 1.19 03/30/2020   HGBA1C 5.6 07/11/2017  CT CARDIAC SCORING  Addendum Date: 06/18/2018   ADDENDUM REPORT: 06/18/2018 17:12 CLINICAL DATA:  Risk stratification EXAM: Coronary Calcium Score TECHNIQUE: The patient was scanned on a Siemens Somatom 64 slice scanner. Axial non-contrast 3 mm slices were carried out through the heart. The data set was analyzed on a dedicated work station and scored using the [place]. FINDINGS: Non-cardiac: See separate report from [facility]. Ascending aorta: Normal diameter 3.5 cm Pericardium: Normal Coronary arteries: No calcium noted IMPRESSION: Coronary calcium score of 0. Jenkins Rouge Electronically Signed   By: Jenkins Rouge M.D.   On: 06/18/2018 17:12   Result Date: 06/18/2018 EXAM: OVER-READ INTERPRETATION  CT CHEST The following report is an over-read performed by radiologist Dr. Aletta Edouard of [facility], [place] on 06/18/2018. This over-read does not include interpretation of cardiac or coronary anatomy or pathology. The coronary calcium score interpretation by the cardiologist is attached. COMPARISON:  None. FINDINGS: Vascular: No incidental findings. Mediastinum/Nodes: Visualized mediastinum and hilar regions show no evidence of lymphadenopathy or masses. Lungs/Pleura: Visualized lungs show no evidence of pulmonary edema, consolidation, pneumothorax, nodule or pleural fluid. Upper Abdomen: No acute abnormality. Musculoskeletal: Mild rightward convex scoliosis. Other: Bilateral gynecomastia. IMPRESSION: No significant incidental findings.  Bilateral gynecomastia present. Electronically Signed: By: Aletta Edouard M.D. On: 06/18/2018 17:02    Assessment & Plan:    Walker Kehr, MD

## 2020-06-30 NOTE — Assessment & Plan Note (Signed)
?   Etiology Labs w/B12

## 2020-06-30 NOTE — Assessment & Plan Note (Signed)
Check Vit B12

## 2020-06-30 NOTE — Assessment & Plan Note (Signed)
Wt Readings from Last 3 Encounters:  06/30/20 234 lb 12.8 oz (106.5 kg)  03/30/20 239 lb 3.2 oz (108.5 kg)  02/11/20 232 lb (105.2 kg)

## 2020-07-08 ENCOUNTER — Other Ambulatory Visit: Payer: Self-pay | Admitting: Internal Medicine

## 2020-09-27 ENCOUNTER — Encounter: Payer: Self-pay | Admitting: Internal Medicine

## 2020-09-27 ENCOUNTER — Ambulatory Visit (INDEPENDENT_AMBULATORY_CARE_PROVIDER_SITE_OTHER): Payer: Medicare Other | Admitting: Internal Medicine

## 2020-09-27 ENCOUNTER — Other Ambulatory Visit: Payer: Self-pay

## 2020-09-27 DIAGNOSIS — E785 Hyperlipidemia, unspecified: Secondary | ICD-10-CM

## 2020-09-27 DIAGNOSIS — R634 Abnormal weight loss: Secondary | ICD-10-CM

## 2020-09-27 DIAGNOSIS — R5383 Other fatigue: Secondary | ICD-10-CM

## 2020-09-27 DIAGNOSIS — F988 Other specified behavioral and emotional disorders with onset usually occurring in childhood and adolescence: Secondary | ICD-10-CM

## 2020-09-27 DIAGNOSIS — F4321 Adjustment disorder with depressed mood: Secondary | ICD-10-CM

## 2020-09-27 DIAGNOSIS — E291 Testicular hypofunction: Secondary | ICD-10-CM | POA: Diagnosis not present

## 2020-09-27 DIAGNOSIS — I10 Essential (primary) hypertension: Secondary | ICD-10-CM

## 2020-09-27 LAB — COMPREHENSIVE METABOLIC PANEL
ALT: 25 U/L (ref 0–53)
AST: 20 U/L (ref 0–37)
Albumin: 4.4 g/dL (ref 3.5–5.2)
Alkaline Phosphatase: 56 U/L (ref 39–117)
BUN: 14 mg/dL (ref 6–23)
CO2: 29 mEq/L (ref 19–32)
Calcium: 9.6 mg/dL (ref 8.4–10.5)
Chloride: 103 mEq/L (ref 96–112)
Creatinine, Ser: 1.1 mg/dL (ref 0.40–1.50)
GFR: 69.7 mL/min (ref >60.00)
Glucose, Bld: 101 mg/dL — ABNORMAL HIGH (ref 70–99)
Potassium: 4.3 mEq/L (ref 3.5–5.1)
Sodium: 140 mEq/L (ref 135–145)
Total Bilirubin: 1 mg/dL (ref 0.2–1.2)
Total Protein: 7.1 g/dL (ref 6.0–8.3)

## 2020-09-27 LAB — CBC WITH DIFFERENTIAL/PLATELET
Basophils Absolute: 0.1 10*3/uL (ref 0.0–0.1)
Basophils Relative: 1 % (ref 0.0–3.0)
Eosinophils Absolute: 0.1 10*3/uL (ref 0.0–0.7)
Eosinophils Relative: 1.2 % (ref 0.0–5.0)
HCT: 47.2 % (ref 39.0–52.0)
Hemoglobin: 15.4 g/dL (ref 13.0–17.0)
Lymphocytes Relative: 16.3 % (ref 12.0–46.0)
Lymphs Abs: 1.6 10*3/uL (ref 0.7–4.0)
MCHC: 32.6 g/dL (ref 30.0–36.0)
MCV: 87.1 fl (ref 78.0–100.0)
Monocytes Absolute: 0.9 10*3/uL (ref 0.1–1.0)
Monocytes Relative: 9.2 % (ref 3.0–12.0)
Neutro Abs: 7.2 10*3/uL (ref 1.4–7.7)
Neutrophils Relative %: 72.3 % (ref 43.0–77.0)
Platelets: 388 10*3/uL (ref 150.0–400.0)
RBC: 5.42 Mil/uL (ref 4.22–5.81)
RDW: 17.1 % — ABNORMAL HIGH (ref 11.5–15.5)
WBC: 9.9 10*3/uL (ref 4.0–10.5)

## 2020-09-27 LAB — TESTOSTERONE: Testosterone: 932.66 ng/dL — ABNORMAL HIGH (ref 300.00–890.00)

## 2020-09-27 MED ORDER — AMPHETAMINE-DEXTROAMPHETAMINE 10 MG PO TABS
10.0000 mg | ORAL_TABLET | Freq: Two times a day (BID) | ORAL | 0 refills | Status: DC
Start: 2020-09-27 — End: 2020-12-27

## 2020-09-27 MED ORDER — AMPHETAMINE-DEXTROAMPHETAMINE 10 MG PO TABS: 10.0000 mg | ORAL_TABLET | Freq: Two times a day (BID) | ORAL | 0 refills | Status: DC

## 2020-09-27 NOTE — Addendum Note (Signed)
Addended by: Jacobo Forest on: 09/27/2020 08:41 AM   Modules accepted: Orders

## 2020-09-27 NOTE — Assessment & Plan Note (Signed)
Losartan 

## 2020-09-27 NOTE — Progress Notes (Signed)
Subjective:  Patient ID: Victor Montes, male    DOB: 1956-02-11  Age: 65 y.o. MRN: [id number]  CC: Follow-up (3 month f/u)   HPI Victor Montes presents for ADD, apathy, BPH Doing a float room  Outpatient Medications Prior to Visit  Medication Sig Dispense Refill  . albuterol (VENTOLIN HFA) 108 (90 Base) MCG/ACT inhaler USE 2 INHALATIONS ORALLY   EVERY 6 HOURS AS NEEDED FORWHEEZING OR SHORTNESS OF   BREATH 17 g 5  . amphetamine-dextroamphetamine (ADDERALL) 10 MG tablet Take 1 tablet (10 mg total) by mouth 2 (two) times daily. 60 tablet 0  . b complex vitamins tablet Take 1 tablet by mouth daily.    Marland Kitchen BREO ELLIPTA 100-25 MCG/INH AEPB USE 1 INHALATION ORALLY    DAILY 1 each 3  . cetirizine (ZYRTEC) 10 MG tablet Take 10 mg by mouth daily.    . cholecalciferol (VITAMIN D) 1000 UNITS tablet Take 1,000 Units by mouth daily.    . Coenzyme Q10 (CO Q 10 PO) Take 1 tablet by mouth daily.    . Ginkgo Biloba Extract 60 MG CAPS Take by mouth.    Marland Kitchen guaiFENesin (MUCINEX) 600 MG 12 hr tablet Take 600 mg by mouth daily with breakfast.    . KRILL OIL PO Take by mouth.    Marland Kitchen MAGNESIUM PO Take 1 tablet by mouth daily.    . Multiple Vitamin (MULTIVITAMIN WITH MINERALS) TABS tablet Take 1 tablet by mouth daily.    . Multiple Vitamins-Minerals (VITAMIN D3 COMPLETE PO) Take by mouth.    . nabumetone (RELAFEN) 500 MG tablet Take 1-2 tablets (500-1,000 mg total) by mouth daily as needed. 60 tablet 3  . Probiotic Product (PROBIOTIC DAILY) CAPS Take 1 capsule by mouth daily.     . sertraline (ZOLOFT) 50 MG tablet Take 1 tablet (50 mg total) by mouth daily. 90 tablet 3  . SYRINGE-NEEDLE, DISP, 3 ML (BD ECLIPSE SYRINGE) 25G X 1" 3 ML MISC USE AS DIRECTED FOR IM INJECTION 50 each 11  . tadalafil (CIALIS) 5 MG tablet Take 1 tablet (5 mg total) by mouth daily. 90 tablet 3  . tamsulosin (FLOMAX) 0.4 MG CAPS capsule Take 1 capsule (0.4 mg total) by mouth daily. 90 capsule 3  . testosterone cypionate (DEPOTESTOSTERONE  CYPIONATE) 200 MG/ML injection Inject 2 ml into the muscle every 2 weeks. 10 mL 1   No facility-administered medications prior to visit.    ROS: Review of Systems  Constitutional: Positive for fatigue. Negative for appetite change and unexpected weight change.  HENT: Negative for congestion, nosebleeds, sneezing, sore throat and trouble swallowing.   Eyes: Negative for itching and visual disturbance.  Respiratory: Negative for cough.   Cardiovascular: Negative for chest pain, palpitations and leg swelling.  Gastrointestinal: Negative for abdominal distention, blood in stool, diarrhea and nausea.  Genitourinary: Negative for frequency and hematuria.  Musculoskeletal: Negative for back pain, gait problem, joint swelling and neck pain.  Skin: Negative for rash.  Neurological: Negative for dizziness, tremors, speech difficulty and weakness.  Psychiatric/Behavioral: Negative for agitation, behavioral problems, dysphoric mood, sleep disturbance and suicidal ideas. The patient is not nervous/anxious.     Objective:  BP 122/70 (BP Location: Left Arm)   Pulse 69   Temp 98 F (36.7 C) (Oral)   Ht 6\' 6"  (1.981 m)   Wt 236 lb 12.8 oz (107.4 kg)   SpO2 98%   BMI 27.36 kg/m   BP Readings from Last 3 Encounters:  09/27/20 122/70  06/30/20 Marland Kitchen)  148/90  03/30/20 132/82    Wt Readings from Last 3 Encounters:  09/27/20 236 lb 12.8 oz (107.4 kg)  06/30/20 234 lb 12.8 oz (106.5 kg)  03/30/20 239 lb 3.2 oz (108.5 kg)    Physical Exam Constitutional:      General: He is not in acute distress.    Appearance: He is well-developed.     Comments: NAD  Eyes:     Conjunctiva/sclera: Conjunctivae normal.     Pupils: Pupils are equal, round, and reactive to light.  Neck:     Thyroid: No thyromegaly.     Vascular: No JVD.  Cardiovascular:     Rate and Rhythm: Normal rate and regular rhythm.     Heart sounds: Normal heart sounds. No murmur heard. No friction rub. No gallop.   Pulmonary:      Effort: Pulmonary effort is normal. No respiratory distress.     Breath sounds: Normal breath sounds. No wheezing or rales.  Chest:     Chest wall: No tenderness.  Abdominal:     General: Bowel sounds are normal. There is no distension.     Palpations: Abdomen is soft. There is no mass.     Tenderness: There is no abdominal tenderness. There is no guarding or rebound.  Musculoskeletal:        General: No tenderness. Normal range of motion.     Cervical back: Normal range of motion.  Lymphadenopathy:     Cervical: No cervical adenopathy.  Skin:    General: Skin is warm and dry.     Findings: No rash.  Neurological:     Mental Status: He is alert and oriented to person, place, and time.     Cranial Nerves: No cranial nerve deficit.     Motor: No abnormal muscle tone.     Coordination: Coordination normal.     Gait: Gait normal.     Deep Tendon Reflexes: Reflexes are normal and symmetric.  Psychiatric:        Behavior: Behavior normal.        Thought Content: Thought content normal.        Judgment: Judgment normal.     Lab Results  Component Value Date   WBC 8.4 06/30/2020   HGB 14.9 06/30/2020   HCT 45.9 06/30/2020   PLT 375.0 06/30/2020   GLUCOSE 97 06/30/2020   CHOL 191 09/23/2019   TRIG 321.0 (H) 09/23/2019   HDL 48.60 09/23/2019   LDLDIRECT 112.0 09/23/2019   LDLCALC 105 (H) 04/08/2019   ALT 21 06/30/2020   AST 18 06/30/2020   NA 138 06/30/2020   K 4.7 06/30/2020   CL 102 06/30/2020   CREATININE 1.13 06/30/2020   BUN 17 06/30/2020   CO2 31 06/30/2020   TSH 0.87 06/30/2020   PSA 1.19 03/30/2020   HGBA1C 5.6 07/11/2017    CT CARDIAC SCORING  Addendum Date: 06/18/2018   ADDENDUM REPORT: 06/18/2018 17:12 CLINICAL DATA:  Risk stratification EXAM: Coronary Calcium Score TECHNIQUE: The patient was scanned on a Siemens Somatom 64 slice scanner. Axial non-contrast 3 mm slices were carried out through the heart. The data set was analyzed on a dedicated work station  and scored using the [place]. FINDINGS: Non-cardiac: See separate report from [facility]. Ascending aorta: Normal diameter 3.5 cm Pericardium: Normal Coronary arteries: No calcium noted IMPRESSION: Coronary calcium score of 0. Jenkins Rouge Electronically Signed   By: Jenkins Rouge M.D.   On: 06/18/2018 17:12   Result Date: 06/18/2018  EXAM: OVER-READ INTERPRETATION  CT CHEST The following report is an over-read performed by radiologist Dr. Aletta Edouard of [facility], [place] on 06/18/2018. This over-read does not include interpretation of cardiac or coronary anatomy or pathology. The coronary calcium score interpretation by the cardiologist is attached. COMPARISON:  None. FINDINGS: Vascular: No incidental findings. Mediastinum/Nodes: Visualized mediastinum and hilar regions show no evidence of lymphadenopathy or masses. Lungs/Pleura: Visualized lungs show no evidence of pulmonary edema, consolidation, pneumothorax, nodule or pleural fluid. Upper Abdomen: No acute abnormality. Musculoskeletal: Mild rightward convex scoliosis. Other: Bilateral gynecomastia. IMPRESSION: No significant incidental findings.  Bilateral gynecomastia present. Electronically Signed: By: Aletta Edouard M.D. On: 06/18/2018 17:02    Assessment & Plan:    Walker Kehr, MD

## 2020-09-27 NOTE — Assessment & Plan Note (Signed)
Cardiac CT calcium score is 0 

## 2020-09-27 NOTE — Patient Instructions (Signed)
Theodore Demark stimulator Transcranial magnetic brain stimulation

## 2020-09-27 NOTE — Assessment & Plan Note (Signed)
Potential benefits of a long term testosterone use as well as potential risks  and complications were explained to the patient and were aknowledged.  On testosterone 

## 2020-09-27 NOTE — Assessment & Plan Note (Signed)
[employer] ref offered Read about: Theodore Demark stimulator Transcranial magnetic brain stimulation

## 2020-09-27 NOTE — Assessment & Plan Note (Signed)
On Adderall Potential benefits of a long term stimulants use as well as potential risks  and complications were explained to the patient and were aknowledged.

## 2020-09-27 NOTE — Assessment & Plan Note (Signed)
Apathy Denies depression

## 2020-09-27 NOTE — Assessment & Plan Note (Signed)
  On diet  

## 2020-10-12 ENCOUNTER — Telehealth: Payer: Self-pay

## 2020-10-12 NOTE — Telephone Encounter (Signed)
Schedule AWV.  

## 2020-11-25 ENCOUNTER — Other Ambulatory Visit: Payer: Self-pay | Admitting: Internal Medicine

## 2020-11-25 MED ORDER — TESTOSTERONE CYPIONATE 200 MG/ML IM SOLN: INTRAMUSCULAR | 1 refills | Status: DC

## 2020-12-27 ENCOUNTER — Ambulatory Visit (INDEPENDENT_AMBULATORY_CARE_PROVIDER_SITE_OTHER): Payer: Medicare Other

## 2020-12-27 ENCOUNTER — Encounter: Payer: Self-pay | Admitting: Internal Medicine

## 2020-12-27 ENCOUNTER — Other Ambulatory Visit: Payer: Self-pay

## 2020-12-27 ENCOUNTER — Telehealth: Payer: Self-pay | Admitting: Internal Medicine

## 2020-12-27 ENCOUNTER — Ambulatory Visit (INDEPENDENT_AMBULATORY_CARE_PROVIDER_SITE_OTHER): Payer: Medicare Other | Admitting: Internal Medicine

## 2020-12-27 VITALS — BP 122/70 | HR 69 | Temp 98.0°F | Ht 78.0 in | Wt 236.8 lb

## 2020-12-27 DIAGNOSIS — F32A Depression, unspecified: Secondary | ICD-10-CM | POA: Insufficient documentation

## 2020-12-27 DIAGNOSIS — F988 Other specified behavioral and emotional disorders with onset usually occurring in childhood and adolescence: Secondary | ICD-10-CM | POA: Diagnosis not present

## 2020-12-27 DIAGNOSIS — M545 Low back pain, unspecified: Secondary | ICD-10-CM | POA: Diagnosis not present

## 2020-12-27 DIAGNOSIS — G8929 Other chronic pain: Secondary | ICD-10-CM

## 2020-12-27 DIAGNOSIS — Z0000 Encounter for general adult medical examination without abnormal findings: Secondary | ICD-10-CM

## 2020-12-27 DIAGNOSIS — F330 Major depressive disorder, recurrent, mild: Secondary | ICD-10-CM

## 2020-12-27 DIAGNOSIS — I10 Essential (primary) hypertension: Secondary | ICD-10-CM

## 2020-12-27 DIAGNOSIS — F4321 Adjustment disorder with depressed mood: Secondary | ICD-10-CM

## 2020-12-27 MED ORDER — TIZANIDINE HCL 4 MG PO TABS: 4.0000 mg | ORAL_TABLET | Freq: Every evening | ORAL | 1 refills | Status: DC | PRN

## 2020-12-27 MED ORDER — AMPHETAMINE-DEXTROAMPHETAMINE 10 MG PO TABS: 10.0000 mg | ORAL_TABLET | Freq: Two times a day (BID) | ORAL | 0 refills | Status: DC

## 2020-12-27 MED ORDER — TADALAFIL 5 MG PO TABS: 5.0000 mg | ORAL_TABLET | Freq: Every day | ORAL | 3 refills | Status: DC

## 2020-12-27 NOTE — Telephone Encounter (Signed)
1.Medication Requested:   tadalafil (CIALIS) 5 MG tablet    2. Pharmacy (Name, Street, [place]):  [facility], [place] [facility] Phone:  [phone]  Fax:  [place] REF: [id number] [place] NUMBER: [phone]  OPTION 2  3. On Med List: y  33. Last Visit with PCP:  8.8.22  5. Next visit date with PCP: 11.9.22   Agent: Please be advised that RX refills may take up to 3 business days. We ask that you follow-up with your pharmacy.

## 2020-12-27 NOTE — Assessment & Plan Note (Addendum)
New LS spine OA, likely facet joint disease Take tizanidine at night Continue with as needed ibuprofen Stretch Obtain x-ray

## 2020-12-27 NOTE — Progress Notes (Addendum)
Subjective:   Victor [facility] is a 65 y.o. male who presents for Medicare Annual/Subsequent preventive examination.  Review of Systems     Cardiac Risk Factors include: advanced age (>9mn, >>53women);dyslipidemia;family history of premature cardiovascular disease;hypertension;male gender     Objective:    Today's Vitals   12/27/20 0908  BP: 122/70  Pulse: 69  Temp: 98 F (36.7 C)  SpO2: 98%  Weight: 236 lb 12.4 oz (107.4 kg)  Height: '6\' 6"'$  (1.981 m)   Body mass index is 27.36 kg/m.  Advanced Directives 12/27/2020 06/09/2016  Does Patient Have a Medical Advance Directive? No No  Would patient like information on creating a medical advance directive? No - Patient declined No - Patient declined    Current Medications (verified) Outpatient Encounter Medications as of 12/27/2020  Medication Sig   albuterol (VENTOLIN HFA) 108 (90 Base) MCG/ACT inhaler USE 2 INHALATIONS ORALLY   EVERY 6 HOURS AS NEEDED FORWHEEZING OR SHORTNESS OF   BREATH   amphetamine-dextroamphetamine (ADDERALL) 10 MG tablet Take 1 tablet (10 mg total) by mouth 2 (two) times daily.   b complex vitamins tablet Take 1 tablet by mouth daily.   BREO ELLIPTA 100-25 MCG/INH AEPB USE 1 INHALATION ORALLY    DAILY   cetirizine (ZYRTEC) 10 MG tablet Take 10 mg by mouth daily.   cholecalciferol (VITAMIN D) 1000 UNITS tablet Take 1,000 Units by mouth daily.   Coenzyme Q10 (CO Q 10 PO) Take 1 tablet by mouth daily.   guaiFENesin (MUCINEX) 600 MG 12 hr tablet Take 600 mg by mouth daily with breakfast.   KRILL OIL PO Take by mouth.   MAGNESIUM PO Take 1 tablet by mouth daily.   Multiple Vitamin (MULTIVITAMIN WITH MINERALS) TABS tablet Take 1 tablet by mouth daily.   Multiple Vitamins-Minerals (VITAMIN D3 COMPLETE PO) Take by mouth.   nabumetone (RELAFEN) 500 MG tablet Take 1-2 tablets (500-1,000 mg total) by mouth daily as needed.   Probiotic Product (PROBIOTIC DAILY) CAPS Take 1 capsule by mouth daily.    sertraline (ZOLOFT)  50 MG tablet Take 1 tablet (50 mg total) by mouth daily.   SYRINGE-NEEDLE, DISP, 3 ML (BD ECLIPSE SYRINGE) 25G X 1" 3 ML MISC USE AS DIRECTED FOR IM INJECTION   tadalafil (CIALIS) 5 MG tablet Take 1 tablet (5 mg total) by mouth daily.   tamsulosin (FLOMAX) 0.4 MG CAPS capsule Take 1 capsule (0.4 mg total) by mouth daily.   testosterone cypionate (DEPOTESTOSTERONE CYPIONATE) 200 MG/ML injection Inject 2 ml into the muscle every 2 weeks.   No facility-administered encounter medications on file as of 12/27/2020.    Allergies (verified) Patient has no known allergies.   History: Past Medical History:  Diagnosis Date   ADD (attention deficit disorder)    Allergic rhinitis    Allergy    Asthma    Cancer ([facility]    basal cell skin cancer    Cervical radiculopathy 2007   Right- Dr. KHal Neer   Constipation 2011   Glucose intolerance (impaired glucose tolerance)    Heart murmur    Hyperlipidemia    Hypogonadism male    Occipital neuralgia 2011   Left   OSA (obstructive sleep apnea)    Sleep apnea    no cpap currently    Past Surgical History:  Procedure Laterality Date   COLONOSCOPY     POLYPECTOMY     UVULOPALATOPLASTY     s/p   Family History  Problem Relation Age of Onset  Colon cancer Mother 88   Cancer Mother 82       colon ca   Stroke Father    Heart disease Father        A fib   Hypertension Other    Coronary artery disease Neg Hx    Colon polyps Neg Hx    Esophageal cancer Neg Hx    Rectal cancer Neg Hx    Stomach cancer Neg Hx    Social History   Socioeconomic History   Marital status: Single    Spouse name: Not on file   Number of children: Not on file   Years of education: Not on file   Highest education level: Not on file  Occupational History   Not on file  Tobacco Use   Smoking status: Some Days    Packs/day: 0.10    Years: 20.00    Pack years: 2.00    Types: E-cigarettes, Cigarettes   Smokeless tobacco: Never   Tobacco comments:    does vape     Vaping Use   Vaping Use: Some days   Substances: Nicotine  Substance and Sexual Activity   Alcohol use: Yes    Alcohol/week: 3.0 standard drinks    Types: 3 Cans of beer per week   Drug use: No   Sexual activity: Yes  Other Topics Concern   Not on file  Social History Narrative   Not on file   Social Determinants of Health   Financial Resource Strain: Low Risk    Difficulty of Paying Living Expenses: Not hard at all  Food Insecurity: No Food Insecurity   Worried About Charity fundraiser in the Last Year: Never true   [place] in the Last Year: Never true  Transportation Needs: No Transportation Needs   Lack of Transportation (Medical): No   Lack of Transportation (Non-Medical): No  Physical Activity: Sufficiently Active   Days of Exercise per Week: 5 days   Minutes of Exercise per Session: 30 min  Stress: No Stress Concern Present   Feeling of Stress : Not at all  Social Connections: Moderately Isolated   Frequency of Communication with Friends and Family: More than three times a week   Frequency of Social Gatherings with Friends and Family: Once a week   Attends Religious Services: Never   Marine scientist or Organizations: Yes   Attends Music therapist: More than 4 times per year   Marital Status: Never married    Tobacco Counseling Ready to quit: Not Answered Counseling given: Not Answered Tobacco comments: does vape     Clinical Intake:  Pre-visit preparation completed: Yes  Pain : No/denies pain     BMI - recorded: 27.36 Nutritional Status: BMI 25 -29 Overweight Nutritional Risks: None Diabetes: No  How often do you need to have someone help you when you read instructions, pamphlets, or other written materials from your doctor or pharmacy?: 1 - Never What is the last grade level you completed in school?: High School Graduate  Diabetic? no  Interpreter Needed?: No  Information entered by :: Lisette Abu,  LPN   Activities of Daily Living In your present state of health, do you have any difficulty performing the following activities: 12/27/2020  Hearing? N  Vision? N  Difficulty concentrating or making decisions? N  Walking or climbing stairs? N  Dressing or bathing? N  Doing errands, shopping? N  Preparing Food and eating ? N  Using the Toilet?  N  In the past six months, have you accidently leaked urine? N  Do you have problems with loss of bowel control? N  Managing your Medications? N  Managing your Finances? N  Housekeeping or managing your Housekeeping? N  Some recent data might be hidden    Patient Care Team: Plotnikov, Evie Lacks, MD as PCP - General  Indicate any recent Medical Services you may have received from other than [facility] providers in the past year (date may be approximate).     Assessment:   This is a routine wellness examination for [employer].  Hearing/Vision screen Hearing Screening - Comments:: Patient denied any hearing difficulty. Vision Screening - Comments:: Patient wears contacts.  Eye exams done annually by [facility]. Julien Girt, OD in [place], [place].  Dietary issues and exercise activities discussed: Current Exercise Habits: Home exercise routine, Type of exercise: walking (remodeling homes), Time (Minutes): 30, Frequency (Times/Week): 5, Weekly Exercise (Minutes/Week): 150, Intensity: Moderate, Exercise limited by: None identified   Goals Addressed             This Visit's Progress    Patient Stated       Now that I am retired, I will increase my physical activity, continue to watch my diet and stay very hydrated.      Depression Screen PHQ 2/9 Scores 12/27/2020 06/30/2020 03/31/2019 02/04/2018 01/08/2017  PHQ - 2 Score 0 0 0 0 0  PHQ- 9 Score - 1 - - -    Fall Risk Fall Risk  12/27/2020 06/30/2020  Falls in the past year? 0 0  Number falls in past yr: 0 0  Injury with Fall? 0 0  Risk for fall due to : No Fall Risks No Fall Risks  Follow up Falls evaluation  completed -    FALL RISK PREVENTION PERTAINING TO THE HOME:  Any stairs in or around the home? Yes  If so, are there any without handrails? No  Home free of loose throw rugs in walkways, pet beds, electrical cords, etc? Yes  Adequate lighting in your home to reduce risk of falls? Yes   ASSISTIVE DEVICES UTILIZED TO PREVENT FALLS:  Life alert? Yes  Use of a cane, walker or w/c? No  Grab bars in the bathroom? No  Shower chair or bench in shower? No  Elevated toilet seat or a handicapped toilet? No   TIMED UP AND GO:  Was the test performed? Yes .  Length of time to ambulate 10 feet: 6 sec.   Gait steady and fast without use of assistive device  Cognitive Function: Normal cognitive status assessed by direct observation by this Nurse Health Advisor. No abnormalities found.          Immunizations Immunization History  Administered Date(s) Administered   Fluad Quad(high Dose 65+) 03/31/2019, 03/30/2020   Influenza Split 03/01/2011   Influenza,inj,Quad PF,6+ Mos 01/16/2014, 07/05/2016, 02/04/2018   Influenza-Unspecified 03/17/2013   PFIZER(Purple Top)SARS-COV-2 Vaccination 07/21/2019, 08/19/2019, 03/05/2020   Tdap 09/20/2011    TDAP status: Up to date  Flu Vaccine status: Up to date  Pneumococcal vaccine status: Due, Education has been provided regarding the importance of this vaccine. Advised may receive this vaccine at local pharmacy or Health Dept. Aware to provide a copy of the vaccination record if obtained from local pharmacy or Health Dept. Verbalized acceptance and understanding.  Covid-19 vaccine status: Completed vaccines  Qualifies for Shingles Vaccine? Yes   Zostavax completed No   Shingrix Completed?: No.    Education has  been provided regarding the importance of this vaccine. Patient has been advised to call insurance company to determine out of pocket expense if they have not yet received this vaccine. Advised may also receive vaccine at local pharmacy or  Health Dept. Verbalized acceptance and understanding.  Screening Tests Health Maintenance  Topic Date Due   Zoster Vaccines- Shingrix (1 of 2) Never done   PNA vac Low Risk Adult (1 of 2 - PCV13) Never done   COVID-19 Vaccine (4 - Booster for Pfizer series) 06/05/2020   INFLUENZA VACCINE  12/20/2020   TETANUS/TDAP  09/19/2021   COLONOSCOPY (Pts 45-72yr Insurance coverage will need to be confirmed)  02/11/2023   Hepatitis C Screening  Completed   HPV VACCINES  Aged Out    Health Maintenance  Health Maintenance Due  Topic Date Due   Zoster Vaccines- Shingrix (1 of 2) Never done   PNA vac Low Risk Adult (1 of 2 - PCV13) Never done   COVID-19 Vaccine (4 - Booster for Pfizer series) 06/05/2020   INFLUENZA VACCINE  12/20/2020    Colorectal cancer screening: Type of screening: Colonoscopy. Completed 02/11/2020. Repeat every 3 years  Lung Cancer Screening: (Low Dose CT Chest recommended if Age 67-80years, 30 pack-year currently smoking OR have quit w/in 15years.) does not qualify.   Lung Cancer Screening Referral: no  Additional Screening:  Hepatitis C Screening: does qualify; Completed yes  Vision Screening: Recommended annual ophthalmology exams for early detection of glaucoma and other disorders of the eye. Is the patient up to date with their annual eye exam?  Yes  Who is the provider or what is the name of the office in which the patient attends annual eye exams? [facility] OD If pt is not established with a provider, would they like to be referred to a provider to establish care? No .   Dental Screening: Recommended annual dental exams for proper oral hygiene  Community Resource Referral / Chronic Care Management: CRR required this visit?  No   CCM required this visit?  No      Plan:     I have personally reviewed and noted the following in the patient's chart:   Medical and social history Use of alcohol, tobacco or illicit drugs  Current medications and  supplements including opioid prescriptions. Patient is not currently taking opioid prescriptions. Functional ability and status Nutritional status Physical activity Advanced directives List of other physicians Hospitalizations, surgeries, and ER visits in previous 12 months Vitals Screenings to include cognitive, depression, and falls Referrals and appointments  In addition, I have reviewed and discussed with patient certain preventive protocols, quality metrics, and best practice recommendations. A written personalized care plan for preventive services as well as general preventive health recommendations were provided to patient.     SSheral Flow LPN   [id number]  Nurse Notes: n/a  Medical screening examination/treatment/procedure(s) were performed by non-physician practitioner and as supervising physician I was immediately available for consultation/collaboration.  I agree with above. ALew Dawes MD

## 2020-12-27 NOTE — Assessment & Plan Note (Addendum)
Off Losartan. Re-start Losartan if BP is high - [facility] weekly Victor Montes is getting his blood pressure check weekly at [facility] where he donates platelets

## 2020-12-27 NOTE — Assessment & Plan Note (Signed)
Depression - better on Theodore Demark device

## 2020-12-27 NOTE — Telephone Encounter (Signed)
PT STATES HE USES [facility] NOW. RX SENT TO [facility].Marland Kitchen/LMB

## 2020-12-27 NOTE — Assessment & Plan Note (Addendum)
Depression - better on Fisher Wallace device.  Continue with Zoloft

## 2020-12-27 NOTE — Progress Notes (Addendum)
Subjective:  Patient ID: Victor [facility], male    DOB: 1955-07-12  Age: 65 y.o. MRN: [id number]  CC: Follow-up (3 month f/u)   HPI Victor [facility] presents for ADD, depression - better on [employer] device; HTN C/o severe LBP - worse w/ROM - taking Ibuprofen 800 mg tid, renovating a house  Outpatient Medications Prior to Visit  Medication Sig Dispense Refill   albuterol (VENTOLIN HFA) 108 (90 Base) MCG/ACT inhaler USE 2 INHALATIONS ORALLY   EVERY 6 HOURS AS NEEDED FORWHEEZING OR SHORTNESS OF   BREATH 17 g 5   amphetamine-dextroamphetamine (ADDERALL) 10 MG tablet Take 1 tablet (10 mg total) by mouth 2 (two) times daily. 60 tablet 0   b complex vitamins tablet Take 1 tablet by mouth daily.     BREO ELLIPTA 100-25 MCG/INH AEPB USE 1 INHALATION ORALLY    DAILY 1 each 3   cetirizine (ZYRTEC) 10 MG tablet Take 10 mg by mouth daily.     cholecalciferol (VITAMIN D) 1000 UNITS tablet Take 1,000 Units by mouth daily.     Coenzyme Q10 (CO Q 10 PO) Take 1 tablet by mouth daily.     guaiFENesin (MUCINEX) 600 MG 12 hr tablet Take 600 mg by mouth daily with breakfast.     KRILL OIL PO Take by mouth.     MAGNESIUM PO Take 1 tablet by mouth daily.     Multiple Vitamin (MULTIVITAMIN WITH MINERALS) TABS tablet Take 1 tablet by mouth daily.     Multiple Vitamins-Minerals (VITAMIN D3 COMPLETE PO) Take by mouth.     nabumetone (RELAFEN) 500 MG tablet Take 1-2 tablets (500-1,000 mg total) by mouth daily as needed. 60 tablet 3   Probiotic Product (PROBIOTIC DAILY) CAPS Take 1 capsule by mouth daily.      sertraline (ZOLOFT) 50 MG tablet Take 1 tablet (50 mg total) by mouth daily. 90 tablet 3   SYRINGE-NEEDLE, DISP, 3 ML (BD ECLIPSE SYRINGE) 25G X 1" 3 ML MISC USE AS DIRECTED FOR IM INJECTION 50 each 11   tadalafil (CIALIS) 5 MG tablet Take 1 tablet (5 mg total) by mouth daily. 90 tablet 3   tamsulosin (FLOMAX) 0.4 MG CAPS capsule Take 1 capsule (0.4 mg total) by mouth daily. 90 capsule 3   testosterone  cypionate (DEPOTESTOSTERONE CYPIONATE) 200 MG/ML injection Inject 2 ml into the muscle every 2 weeks. 10 mL 1   amphetamine-dextroamphetamine (ADDERALL) 10 MG tablet Take 1 tablet (10 mg total) by mouth 2 (two) times daily. 60 tablet 0   amphetamine-dextroamphetamine (ADDERALL) 10 MG tablet Take 1 tablet (10 mg total) by mouth 2 (two) times daily. 60 tablet 0   Ginkgo Biloba Extract 60 MG CAPS Take by mouth. (Patient not taking: Reported on 12/27/2020)     No facility-administered medications prior to visit.    ROS: Review of Systems  Constitutional:  Negative for appetite change, fatigue and unexpected weight change.  HENT:  Negative for congestion, nosebleeds, sneezing, sore throat and trouble swallowing.   Eyes:  Negative for itching and visual disturbance.  Respiratory:  Negative for cough.   Cardiovascular:  Negative for chest pain, palpitations and leg swelling.  Gastrointestinal:  Negative for abdominal distention, blood in stool, diarrhea and nausea.  Genitourinary:  Negative for frequency and hematuria.  Musculoskeletal:  Positive for back pain. Negative for gait problem, joint swelling and neck pain.  Skin:  Negative for rash.  Neurological:  Negative for dizziness, tremors, speech difficulty and weakness.  Psychiatric/Behavioral:  Positive  for decreased concentration and dysphoric mood. Negative for agitation, sleep disturbance and suicidal ideas. The patient is not nervous/anxious.    Objective:  BP (!) 158/70 (BP Location: Left Arm)   Pulse 85   Temp 98.6 F (37 C) (Oral)   Ht '6\' 6"'$  (1.981 m)   Wt 228 lb 12.8 oz (103.8 kg)   SpO2 97%   BMI 26.44 kg/m   BP Readings from Last 3 Encounters:  12/27/20 (!) 158/70  09/27/20 122/70  06/30/20 (!) 148/90    Wt Readings from Last 3 Encounters:  12/27/20 228 lb 12.8 oz (103.8 kg)  09/27/20 236 lb 12.8 oz (107.4 kg)  06/30/20 234 lb 12.8 oz (106.5 kg)    Physical Exam Constitutional:      General: He is not in acute  distress.    Appearance: He is well-developed.     Comments: NAD  Eyes:     Conjunctiva/sclera: Conjunctivae normal.     Pupils: Pupils are equal, round, and reactive to light.  Neck:     Thyroid: No thyromegaly.     Vascular: No JVD.  Cardiovascular:     Rate and Rhythm: Normal rate and regular rhythm.     Heart sounds: Normal heart sounds. No murmur heard.   No friction rub. No gallop.  Pulmonary:     Effort: Pulmonary effort is normal. No respiratory distress.     Breath sounds: Normal breath sounds. No wheezing or rales.  Chest:     Chest wall: No tenderness.  Abdominal:     General: Bowel sounds are normal. There is no distension.     Palpations: Abdomen is soft. There is no mass.     Tenderness: There is no abdominal tenderness. There is no guarding or rebound.  Musculoskeletal:        General: Tenderness present. Normal range of motion.     Cervical back: Normal range of motion.  Lymphadenopathy:     Cervical: No cervical adenopathy.  Skin:    General: Skin is warm and dry.     Findings: No rash.  Neurological:     Mental Status: He is alert and oriented to person, place, and time.     Cranial Nerves: No cranial nerve deficit.     Motor: No abnormal muscle tone.     Coordination: Coordination normal.     Gait: Gait normal.     Deep Tendon Reflexes: Reflexes are normal and symmetric.  Psychiatric:        Behavior: Behavior normal.        Thought Content: Thought content normal.        Judgment: Judgment normal.  LS - tender  Lab Results  Component Value Date   WBC 9.9 09/27/2020   HGB 15.4 09/27/2020   HCT 47.2 09/27/2020   PLT 388.0 09/27/2020   GLUCOSE 101 (H) 09/27/2020   CHOL 191 09/23/2019   TRIG 321.0 (H) 09/23/2019   HDL 48.60 09/23/2019   LDLDIRECT 112.0 09/23/2019   LDLCALC 105 (H) 04/08/2019   ALT 25 09/27/2020   AST 20 09/27/2020   NA 140 09/27/2020   K 4.3 09/27/2020   CL 103 09/27/2020   CREATININE 1.10 09/27/2020   BUN 14 09/27/2020    CO2 29 09/27/2020   TSH 0.87 06/30/2020   PSA 1.19 03/30/2020   HGBA1C 5.6 07/11/2017    CT CARDIAC SCORING  Addendum Date: 06/18/2018   ADDENDUM REPORT: 06/18/2018 17:12 CLINICAL DATA:  Risk stratification EXAM: Coronary Calcium Score TECHNIQUE:  The patient was scanned on a Siemens Somatom 64 slice scanner. Axial non-contrast 3 mm slices were carried out through the heart. The data set was analyzed on a dedicated work station and scored using the [place]. FINDINGS: Non-cardiac: See separate report from [facility]. Ascending aorta: Normal diameter 3.5 cm Pericardium: Normal Coronary arteries: No calcium noted IMPRESSION: Coronary calcium score of 0. Jenkins Rouge Electronically Signed   By: Jenkins Rouge M.D.   On: 06/18/2018 17:12   Result Date: 06/18/2018 EXAM: OVER-READ INTERPRETATION  CT CHEST The following report is an over-read performed by radiologist Dr. Aletta Edouard of [facility], [place] on 06/18/2018. This over-read does not include interpretation of cardiac or coronary anatomy or pathology. The coronary calcium score interpretation by the cardiologist is attached. COMPARISON:  None. FINDINGS: Vascular: No incidental findings. Mediastinum/Nodes: Visualized mediastinum and hilar regions show no evidence of lymphadenopathy or masses. Lungs/Pleura: Visualized lungs show no evidence of pulmonary edema, consolidation, pneumothorax, nodule or pleural fluid. Upper Abdomen: No acute abnormality. Musculoskeletal: Mild rightward convex scoliosis. Other: Bilateral gynecomastia. IMPRESSION: No significant incidental findings.  Bilateral gynecomastia present. Electronically Signed: By: Aletta Edouard M.D. On: 06/18/2018 17:02    Assessment & Plan:    Walker Kehr, MD

## 2020-12-27 NOTE — Assessment & Plan Note (Addendum)
Cont w/ Adderall   Potential benefits of a long term stimulants use as well as potential risks  and complications were explained to the patient and were aknowledged.

## 2021-01-10 ENCOUNTER — Other Ambulatory Visit: Payer: Self-pay | Admitting: Internal Medicine

## 2021-01-27 NOTE — Telephone Encounter (Signed)
Downloaded form place in purple folder for MD to sign.Marland KitchenJohny Chess

## 2021-02-04 ENCOUNTER — Other Ambulatory Visit: Payer: Self-pay | Admitting: *Deleted

## 2021-02-19 ENCOUNTER — Other Ambulatory Visit: Payer: Self-pay | Admitting: Internal Medicine

## 2021-03-01 ENCOUNTER — Telehealth: Payer: Self-pay | Admitting: Internal Medicine

## 2021-03-01 NOTE — Telephone Encounter (Signed)
Pharmacy # [phone] option 2 Reference # [id number]   1.Medication Requested: sertraline (ZOLOFT) 50 MG tablet  2. Pharmacy (Name, Street, [place]): [place], [place] [facility]  3. On Med List: Yes  4. Last Visit with PCP: 12-27-2020  5. Next visit date with PCP: 03-30-2021   Agent: Please be advised that RX refills may take up to 3 business days. We ask that you follow-up with your pharmacy.

## 2021-03-04 MED ORDER — SERTRALINE HCL 50 MG PO TABS: 50.0000 mg | ORAL_TABLET | Freq: Every day | ORAL | 3 refills | Status: DC

## 2021-03-04 NOTE — Telephone Encounter (Signed)
[facility]. They are needing refill on sertraline. Rx on file has expired. Pt is up-to-dated gave verbal for Sertraline 50 mg take 1 a day # 90 w/ 3 refills.Updated med list.../lmb

## 2021-03-30 ENCOUNTER — Encounter: Payer: Self-pay | Admitting: Internal Medicine

## 2021-03-30 ENCOUNTER — Other Ambulatory Visit: Payer: Self-pay

## 2021-03-30 ENCOUNTER — Ambulatory Visit (INDEPENDENT_AMBULATORY_CARE_PROVIDER_SITE_OTHER): Payer: Medicare Other | Admitting: Internal Medicine

## 2021-03-30 VITALS — BP 124/62 | HR 65 | Temp 98.7°F | Ht 78.0 in | Wt 234.0 lb

## 2021-03-30 DIAGNOSIS — Z23 Encounter for immunization: Secondary | ICD-10-CM | POA: Diagnosis not present

## 2021-03-30 DIAGNOSIS — E291 Testicular hypofunction: Secondary | ICD-10-CM

## 2021-03-30 DIAGNOSIS — N3281 Overactive bladder: Secondary | ICD-10-CM

## 2021-03-30 DIAGNOSIS — F988 Other specified behavioral and emotional disorders with onset usually occurring in childhood and adolescence: Secondary | ICD-10-CM

## 2021-03-30 DIAGNOSIS — M545 Low back pain, unspecified: Secondary | ICD-10-CM | POA: Diagnosis not present

## 2021-03-30 DIAGNOSIS — G8929 Other chronic pain: Secondary | ICD-10-CM

## 2021-03-30 MED ORDER — AMPHETAMINE-DEXTROAMPHETAMINE 10 MG PO TABS: 10.0000 mg | ORAL_TABLET | Freq: Two times a day (BID) | ORAL | 0 refills | Status: DC

## 2021-03-30 MED ORDER — TESTOSTERONE CYPIONATE 200 MG/ML IM SOLN: INTRAMUSCULAR | 1 refills | Status: DC

## 2021-03-30 MED ORDER — MIRABEGRON ER 50 MG PO TB24: 50.0000 mg | ORAL_TABLET | Freq: Every day | ORAL | 3 refills | Status: DC

## 2021-03-30 NOTE — Progress Notes (Signed)
Subjective:  Patient ID: Victor Montes, male    DOB: December 20, 1955  Age: 65 y.o. MRN: [id number]  CC: Follow-up (3 month f/u- Flu shot)   HPI Dezmen Alcock presents for ADD, LBP, hypogonadism He is in a LBP drug study for facet joint OA (Facet joints injections XT150 - Inerleukin 10 related Rx) [place]. Doing great. In PT weekly.    Outpatient Medications Prior to Visit  Medication Sig Dispense Refill   albuterol (VENTOLIN HFA) 108 (90 Base) MCG/ACT inhaler USE 2 INHALATIONS ORALLY   EVERY 6 HOURS AS NEEDED FORWHEEZING OR SHORTNESS OF   BREATH 17 g 5   b complex vitamins tablet Take 1 tablet by mouth daily.     BREO ELLIPTA 100-25 MCG/INH AEPB USE 1 INHALATION ORALLY    DAILY 1 each 3   cetirizine (ZYRTEC) 10 MG tablet Take 10 mg by mouth daily.     Coenzyme Q10 (CO Q 10 PO) Take 1 tablet by mouth daily.     guaiFENesin (MUCINEX) 600 MG 12 hr tablet Take 600 mg by mouth daily with breakfast.     KRILL OIL PO Take by mouth.     MAGNESIUM PO Take 1 tablet by mouth daily.     Multiple Vitamin (MULTIVITAMIN WITH MINERALS) TABS tablet Take 1 tablet by mouth daily.     Multiple Vitamins-Minerals (VITAMIN D3 COMPLETE PO) Take by mouth.     nabumetone (RELAFEN) 500 MG tablet Take 1-2 tablets (500-1,000 mg total) by mouth daily as needed. 60 tablet 3   Probiotic Product (PROBIOTIC DAILY) CAPS Take 1 capsule by mouth daily.      sertraline (ZOLOFT) 50 MG tablet Take 1 tablet (50 mg total) by mouth daily. 90 tablet 3   SYRINGE-NEEDLE, DISP, 3 ML (BD ECLIPSE SYRINGE) 25G X 1" 3 ML MISC USE AS DIRECTED FOR IM INJECTION 50 each 11   tadalafil (CIALIS) 5 MG tablet Take 1 tablet (5 mg total) by mouth daily. 90 tablet 3   tamsulosin (FLOMAX) 0.4 MG CAPS capsule Take 1 capsule (0.4 mg total) by mouth daily. 90 capsule 1   tiZANidine (ZANAFLEX) 4 MG tablet TAKE ONE TABLET BY MOUTH AT BEDTIME AS NEEDED FOR MUSCLE SPASM 30 tablet 0   amphetamine-dextroamphetamine (ADDERALL) 10 MG tablet Take  1 tablet (10 mg total) by mouth 2 (two) times daily. 60 tablet 0   testosterone cypionate (DEPOTESTOSTERONE CYPIONATE) 200 MG/ML injection Inject 2 ml into the muscle every 2 weeks. 10 mL 1   amphetamine-dextroamphetamine (ADDERALL) 10 MG tablet Take 1 tablet (10 mg total) by mouth 2 (two) times daily. 60 tablet 0   amphetamine-dextroamphetamine (ADDERALL) 10 MG tablet Take 1 tablet (10 mg total) by mouth 2 (two) times daily. 60 tablet 0   cholecalciferol (VITAMIN D) 1000 UNITS tablet Take 1,000 Units by mouth daily.     No facility-administered medications prior to visit.    ROS: Review of Systems  Constitutional:  Negative for appetite change, fatigue and unexpected weight change.  HENT:  Negative for congestion, nosebleeds, sneezing, sore throat and trouble swallowing.   Eyes:  Negative for itching and visual disturbance.  Respiratory:  Negative for cough.   Cardiovascular:  Negative for chest pain, palpitations and leg swelling.  Gastrointestinal:  Negative for abdominal distention, blood in stool, diarrhea and nausea.  Genitourinary:  Negative for frequency and hematuria.  Musculoskeletal:  Positive for back pain. Negative for gait problem, joint swelling and neck pain.  Skin:  Negative for rash.  Neurological:  Negative for dizziness, tremors, speech difficulty and weakness.  Psychiatric/Behavioral:  Positive for decreased concentration. Negative for agitation, dysphoric mood and sleep disturbance. The patient is not nervous/anxious.    Objective:  BP 124/62 (BP Location: Left Arm)   Pulse 65   Temp 98.7 F (37.1 C) (Oral)   Ht 6\' 6"  (1.981 m)   Wt 234 lb (106.1 kg)   SpO2 97%   BMI 27.04 kg/m   BP Readings from Last 3 Encounters:  03/30/21 124/62  12/27/20 122/70  12/27/20 (!) 158/70    Wt Readings from Last 3 Encounters:  03/30/21 234 lb (106.1 kg)  12/27/20 236 lb 12.4 oz (107.4 kg)  12/27/20 228 lb 12.8 oz (103.8 kg)    Physical Exam Constitutional:       General: He is not in acute distress.    Appearance: Normal appearance. He is well-developed.     Comments: NAD  Eyes:     Conjunctiva/sclera: Conjunctivae normal.     Pupils: Pupils are equal, round, and reactive to light.  Neck:     Thyroid: No thyromegaly.     Vascular: No JVD.  Cardiovascular:     Rate and Rhythm: Normal rate and regular rhythm.     Heart sounds: Normal heart sounds. No murmur heard.   No friction rub. No gallop.  Pulmonary:     Effort: Pulmonary effort is normal. No respiratory distress.     Breath sounds: Normal breath sounds. No wheezing or rales.  Chest:     Chest wall: No tenderness.  Abdominal:     General: Bowel sounds are normal. There is no distension.     Palpations: Abdomen is soft. There is no mass.     Tenderness: There is no abdominal tenderness. There is no guarding or rebound.  Musculoskeletal:        General: No tenderness. Normal range of motion.     Cervical back: Normal range of motion.  Lymphadenopathy:     Cervical: No cervical adenopathy.  Skin:    General: Skin is warm and dry.     Findings: No rash.  Neurological:     Mental Status: He is alert and oriented to person, place, and time.     Cranial Nerves: No cranial nerve deficit.     Motor: No abnormal muscle tone.     Coordination: Coordination normal.     Gait: Gait normal.     Deep Tendon Reflexes: Reflexes are normal and symmetric.  Psychiatric:        Behavior: Behavior normal.        Thought Content: Thought content normal.        Judgment: Judgment normal.    Lab Results  Component Value Date   WBC 9.9 09/27/2020   HGB 15.4 09/27/2020   HCT 47.2 09/27/2020   PLT 388.0 09/27/2020   GLUCOSE 101 (H) 09/27/2020   CHOL 191 09/23/2019   TRIG 321.0 (H) 09/23/2019   HDL 48.60 09/23/2019   LDLDIRECT 112.0 09/23/2019   LDLCALC 105 (H) 04/08/2019   ALT 25 09/27/2020   AST 20 09/27/2020   NA 140 09/27/2020   K 4.3 09/27/2020   CL 103 09/27/2020   CREATININE 1.10  09/27/2020   BUN 14 09/27/2020   CO2 29 09/27/2020   TSH 0.87 06/30/2020   PSA 1.19 03/30/2020   HGBA1C 5.6 07/11/2017    CT CARDIAC SCORING  Addendum Date: 06/18/2018   ADDENDUM REPORT: 06/18/2018 17:12 CLINICAL DATA:  Risk stratification  EXAM: Coronary Calcium Score TECHNIQUE: The patient was scanned on a Siemens Somatom 64 slice scanner. Axial non-contrast 3 mm slices were carried out through the heart. The data set was analyzed on a dedicated work station and scored using the [place]. FINDINGS: Non-cardiac: See separate report from [facility]. Ascending aorta: Normal diameter 3.5 cm Pericardium: Normal Coronary arteries: No calcium noted IMPRESSION: Coronary calcium score of 0. Jenkins Rouge Electronically Signed   By: Jenkins Rouge M.D.   On: 06/18/2018 17:12   Result Date: 06/18/2018 EXAM: OVER-READ INTERPRETATION  CT CHEST The following report is an over-read performed by radiologist Dr. Aletta Edouard of [facility], [place] on 06/18/2018. This over-read does not include interpretation of cardiac or coronary anatomy or pathology. The coronary calcium score interpretation by the cardiologist is attached. COMPARISON:  None. FINDINGS: Vascular: No incidental findings. Mediastinum/Nodes: Visualized mediastinum and hilar regions show no evidence of lymphadenopathy or masses. Lungs/Pleura: Visualized lungs show no evidence of pulmonary edema, consolidation, pneumothorax, nodule or pleural fluid. Upper Abdomen: No acute abnormality. Musculoskeletal: Mild rightward convex scoliosis. Other: Bilateral gynecomastia. IMPRESSION: No significant incidental findings.  Bilateral gynecomastia present. Electronically Signed: By: Aletta Edouard M.D. On: 06/18/2018 17:02    Assessment & Plan:   Problem List Items Addressed This Visit     Attention deficit disorder    On Adderall Potential benefits of a long term stimulants use as well as potential risks  and complications were explained  to the patient and were aknowledged.      Hypogonadism in male     Potential benefits of a long term testosterone use as well as potential risks  and complications were explained to the patient and were aknowledged.  On testosterone      Low back pain    He is in a LBP drug study for facet joint OA (Facet joints injections XT150 - Inerleukin 10 related Rx) [place]. Doing great. In PT weekly.      OAB (overactive bladder)    On Flomax/Cialis qd Worse - try to add Myrbetriq po Urology ref was offered      Other Visit Diagnoses     Needs flu shot    -  Primary   Relevant Orders   Flu Vaccine QUAD High Dose(Fluad) (Completed)         Meds ordered this encounter  Medications   amphetamine-dextroamphetamine (ADDERALL) 10 MG tablet    Sig: Take 1 tablet (10 mg total) by mouth 2 (two) times daily.    Dispense:  60 tablet    Refill:  0    Please fill on or after 04/28/21   amphetamine-dextroamphetamine (ADDERALL) 10 MG tablet    Sig: Take 1 tablet (10 mg total) by mouth 2 (two) times daily.    Dispense:  60 tablet    Refill:  0    Please fill on or after 05/28/21   amphetamine-dextroamphetamine (ADDERALL) 10 MG tablet    Sig: Take 1 tablet (10 mg total) by mouth 2 (two) times daily.    Dispense:  60 tablet    Refill:  0    Please fill on or after 06/27/21   testosterone cypionate (DEPOTESTOSTERONE CYPIONATE) 200 MG/ML injection    Sig: Inject 2 ml into the muscle every 2 weeks.    Dispense:  10 mL    Refill:  1   mirabegron ER (MYRBETRIQ) 50 MG TB24 tablet    Sig: Take 1 tablet (50 mg total) by mouth daily.  Dispense:  90 tablet    Refill:  3       Follow-up: Return in about 3 months (around 06/30/2021) for a follow-up visit.  Walker Kehr, MD

## 2021-03-30 NOTE — Assessment & Plan Note (Signed)
Potential benefits of a long term testosterone use as well as potential risks  and complications were explained to the patient and were aknowledged.  On testosterone

## 2021-03-30 NOTE — Assessment & Plan Note (Signed)
On Adderall Potential benefits of a long term stimulants use as well as potential risks  and complications were explained to the patient and were aknowledged.

## 2021-03-30 NOTE — Assessment & Plan Note (Signed)
On Flomax/Cialis qd Worse - try to add Myrbetriq po Urology ref was offered

## 2021-03-30 NOTE — Assessment & Plan Note (Signed)
He is in a LBP drug study for facet joint OA (Facet joints injections XT150 - Inerleukin 10 related Rx) [place]. Doing great. In PT weekly.

## 2021-07-06 ENCOUNTER — Encounter: Payer: Self-pay | Admitting: Internal Medicine

## 2021-07-06 ENCOUNTER — Other Ambulatory Visit: Payer: Self-pay

## 2021-07-06 ENCOUNTER — Ambulatory Visit (INDEPENDENT_AMBULATORY_CARE_PROVIDER_SITE_OTHER): Payer: Medicare Other | Admitting: Internal Medicine

## 2021-07-06 VITALS — BP 126/63 | HR 73 | Temp 98.7°F | Ht 78.0 in | Wt 236.0 lb

## 2021-07-06 DIAGNOSIS — E785 Hyperlipidemia, unspecified: Secondary | ICD-10-CM

## 2021-07-06 DIAGNOSIS — Z0000 Encounter for general adult medical examination without abnormal findings: Secondary | ICD-10-CM

## 2021-07-06 DIAGNOSIS — B070 Plantar wart: Secondary | ICD-10-CM

## 2021-07-06 DIAGNOSIS — I10 Essential (primary) hypertension: Secondary | ICD-10-CM

## 2021-07-06 DIAGNOSIS — M545 Low back pain, unspecified: Secondary | ICD-10-CM

## 2021-07-06 DIAGNOSIS — N320 Bladder-neck obstruction: Secondary | ICD-10-CM

## 2021-07-06 DIAGNOSIS — E291 Testicular hypofunction: Secondary | ICD-10-CM | POA: Diagnosis not present

## 2021-07-06 DIAGNOSIS — G8929 Other chronic pain: Secondary | ICD-10-CM

## 2021-07-06 DIAGNOSIS — F988 Other specified behavioral and emotional disorders with onset usually occurring in childhood and adolescence: Secondary | ICD-10-CM

## 2021-07-06 MED ORDER — AMPHETAMINE-DEXTROAMPHETAMINE 10 MG PO TABS: 10.0000 mg | ORAL_TABLET | Freq: Two times a day (BID) | ORAL | 0 refills | Status: DC

## 2021-07-06 NOTE — Progress Notes (Signed)
Subjective:  Patient ID: Victor Montes, male    DOB: Mar 09, 1956  Age: 66 y.o. MRN: [id number]  CC: No chief complaint on file.   HPI Victor Montes presents for ADD, hypogonadism, LBP  Outpatient Medications Prior to Visit  Medication Sig Dispense Refill   albuterol (VENTOLIN HFA) 108 (90 Base) MCG/ACT inhaler USE 2 INHALATIONS ORALLY   EVERY 6 HOURS AS NEEDED FORWHEEZING OR SHORTNESS OF   BREATH 17 g 5   b complex vitamins tablet Take 1 tablet by mouth daily.     cetirizine (ZYRTEC) 10 MG tablet Take 10 mg by mouth daily.     guaiFENesin (MUCINEX) 600 MG 12 hr tablet Take 600 mg by mouth daily with breakfast.     KRILL OIL PO Take by mouth.     MAGNESIUM PO Take 1 tablet by mouth daily.     mirabegron ER (MYRBETRIQ) 50 MG TB24 tablet Take 1 tablet (50 mg total) by mouth daily. 90 tablet 3   Multiple Vitamin (MULTIVITAMIN WITH MINERALS) TABS tablet Take 1 tablet by mouth daily.     Multiple Vitamins-Minerals (VITAMIN D3 COMPLETE PO) Take by mouth.     nabumetone (RELAFEN) 500 MG tablet Take 1-2 tablets (500-1,000 mg total) by mouth daily as needed. 60 tablet 3   Probiotic Product (PROBIOTIC DAILY) CAPS Take 1 capsule by mouth daily.      sertraline (ZOLOFT) 50 MG tablet Take 1 tablet (50 mg total) by mouth daily. 90 tablet 3   SYRINGE-NEEDLE, DISP, 3 ML (BD ECLIPSE SYRINGE) 25G X 1" 3 ML MISC USE AS DIRECTED FOR IM INJECTION 50 each 11   tadalafil (CIALIS) 5 MG tablet Take 1 tablet (5 mg total) by mouth daily. 90 tablet 3   tamsulosin (FLOMAX) 0.4 MG CAPS capsule Take 1 capsule (0.4 mg total) by mouth daily. 90 capsule 1   testosterone cypionate (DEPOTESTOSTERONE CYPIONATE) 200 MG/ML injection Inject 2 ml into the muscle every 2 weeks. 10 mL 1   tiZANidine (ZANAFLEX) 4 MG tablet TAKE ONE TABLET BY MOUTH AT BEDTIME AS NEEDED FOR MUSCLE SPASM 30 tablet 0   amphetamine-dextroamphetamine (ADDERALL) 10 MG tablet Take 1 tablet (10 mg total) by mouth 2 (two) times daily. 60 tablet 0    amphetamine-dextroamphetamine (ADDERALL) 10 MG tablet Take 1 tablet (10 mg total) by mouth 2 (two) times daily. 60 tablet 0   amphetamine-dextroamphetamine (ADDERALL) 10 MG tablet Take 1 tablet (10 mg total) by mouth 2 (two) times daily. 60 tablet 0   BREO ELLIPTA 100-25 MCG/INH AEPB USE 1 INHALATION ORALLY    DAILY 1 each 3   Coenzyme Q10 (CO Q 10 PO) Take 1 tablet by mouth daily.     No facility-administered medications prior to visit.    ROS: Review of Systems  Constitutional:  Negative for appetite change, fatigue and unexpected weight change.  HENT:  Negative for congestion, nosebleeds, sneezing, sore throat and trouble swallowing.   Eyes:  Negative for itching and visual disturbance.  Respiratory:  Negative for cough.   Cardiovascular:  Negative for chest pain, palpitations and leg swelling.  Gastrointestinal:  Negative for abdominal distention, blood in stool, diarrhea and nausea.  Genitourinary:  Negative for frequency and hematuria.  Musculoskeletal:  Negative for back pain, gait problem, joint swelling and neck pain.  Skin:  Negative for rash.  Neurological:  Negative for dizziness, tremors, speech difficulty and weakness.  Psychiatric/Behavioral:  Negative for agitation, dysphoric mood, sleep disturbance and suicidal ideas. The patient is not nervous/anxious.  Objective:  BP 126/63 (BP Location: Left Arm, Patient Position: Sitting, Cuff Size: Large)    Pulse 73    Temp 98.7 F (37.1 C) (Oral)    Ht 6\' 6"  (1.981 m)    Wt 236 lb (107 kg)    SpO2 97%    BMI 27.27 kg/m   BP Readings from Last 3 Encounters:  07/06/21 126/63  03/30/21 124/62  12/27/20 122/70    Wt Readings from Last 3 Encounters:  07/06/21 236 lb (107 kg)  03/30/21 234 lb (106.1 kg)  12/27/20 236 lb 12.4 oz (107.4 kg)    Physical Exam Constitutional:      General: He is not in acute distress.    Appearance: Normal appearance. He is well-developed.     Comments: NAD  Eyes:     Conjunctiva/sclera:  Conjunctivae normal.     Pupils: Pupils are equal, round, and reactive to light.  Neck:     Thyroid: No thyromegaly.     Vascular: No JVD.  Cardiovascular:     Rate and Rhythm: Normal rate and regular rhythm.     Heart sounds: Normal heart sounds. No murmur heard.   No friction rub. No gallop.  Pulmonary:     Effort: Pulmonary effort is normal. No respiratory distress.     Breath sounds: Normal breath sounds. No wheezing or rales.  Chest:     Chest wall: No tenderness.  Abdominal:     General: Bowel sounds are normal. There is no distension.     Palpations: Abdomen is soft. There is no mass.     Tenderness: There is no abdominal tenderness. There is no guarding or rebound.  Musculoskeletal:        General: No tenderness. Normal range of motion.     Cervical back: Normal range of motion.  Lymphadenopathy:     Cervical: No cervical adenopathy.  Skin:    General: Skin is warm and dry.     Findings: No rash.  Neurological:     Mental Status: He is alert and oriented to person, place, and time.     Cranial Nerves: No cranial nerve deficit.     Motor: No abnormal muscle tone.     Coordination: Coordination normal.     Gait: Gait normal.     Deep Tendon Reflexes: Reflexes are normal and symmetric.  Psychiatric:        Behavior: Behavior normal.        Thought Content: Thought content normal.        Judgment: Judgment normal.   L 2nd toe lesion  Lab Results  Component Value Date   WBC 9.9 09/27/2020   HGB 15.4 09/27/2020   HCT 47.2 09/27/2020   PLT 388.0 09/27/2020   GLUCOSE 101 (H) 09/27/2020   CHOL 191 09/23/2019   TRIG 321.0 (H) 09/23/2019   HDL 48.60 09/23/2019   LDLDIRECT 112.0 09/23/2019   LDLCALC 105 (H) 04/08/2019   ALT 25 09/27/2020   AST 20 09/27/2020   NA 140 09/27/2020   K 4.3 09/27/2020   CL 103 09/27/2020   CREATININE 1.10 09/27/2020   BUN 14 09/27/2020   CO2 29 09/27/2020   TSH 0.87 06/30/2020   PSA 1.19 03/30/2020   HGBA1C 5.6 07/11/2017    CT  CARDIAC SCORING  Addendum Date: 06/18/2018   ADDENDUM REPORT: 06/18/2018 17:12 CLINICAL DATA:  Risk stratification EXAM: Coronary Calcium Score TECHNIQUE: The patient was scanned on a Siemens Somatom 64 slice scanner. Axial non-contrast 3 mm  slices were carried out through the heart. The data set was analyzed on a dedicated work station and scored using the [place]. FINDINGS: Non-cardiac: See separate report from [facility]. Ascending aorta: Normal diameter 3.5 cm Pericardium: Normal Coronary arteries: No calcium noted IMPRESSION: Coronary calcium score of 0. Jenkins Rouge Electronically Signed   By: Jenkins Rouge M.D.   On: 06/18/2018 17:12   Result Date: 06/18/2018 EXAM: OVER-READ INTERPRETATION  CT CHEST The following report is an over-read performed by radiologist Dr. Aletta Edouard of [facility], [place] on 06/18/2018. This over-read does not include interpretation of cardiac or coronary anatomy or pathology. The coronary calcium score interpretation by the cardiologist is attached. COMPARISON:  None. FINDINGS: Vascular: No incidental findings. Mediastinum/Nodes: Visualized mediastinum and hilar regions show no evidence of lymphadenopathy or masses. Lungs/Pleura: Visualized lungs show no evidence of pulmonary edema, consolidation, pneumothorax, nodule or pleural fluid. Upper Abdomen: No acute abnormality. Musculoskeletal: Mild rightward convex scoliosis. Other: Bilateral gynecomastia. IMPRESSION: No significant incidental findings.  Bilateral gynecomastia present. Electronically Signed: By: Aletta Edouard M.D. On: 06/18/2018 17:02    Assessment & Plan:   Problem List Items Addressed This Visit     Dyslipidemia - Primary   Relevant Orders   TSH   Urinalysis   CBC with Differential/Platelet   Lipid panel   Comprehensive metabolic panel   PSA   Testosterone   Well adult exam   Relevant Orders   TSH   Urinalysis   CBC with Differential/Platelet   Lipid panel    Comprehensive metabolic panel   PSA   Other Visit Diagnoses     Hypogonadism male       Relevant Orders   TSH   Urinalysis   CBC with Differential/Platelet   Lipid panel   Comprehensive metabolic panel   PSA   Testosterone   Essential hypertension       Relevant Orders   TSH   Urinalysis   CBC with Differential/Platelet   Lipid panel   Comprehensive metabolic panel   PSA   Testosterone   Bladder neck obstruction       Relevant Orders   PSA         Meds ordered this encounter  Medications   DISCONTD: amphetamine-dextroamphetamine (ADDERALL) 10 MG tablet    Sig: Take 1 tablet (10 mg total) by mouth 2 (two) times daily.    Dispense:  60 tablet    Refill:  0    Please fill on or after 09/25/21   DISCONTD: amphetamine-dextroamphetamine (ADDERALL) 10 MG tablet    Sig: Take 1 tablet (10 mg total) by mouth 2 (two) times daily.    Dispense:  60 tablet    Refill:  0    Please fill on or after 08/26/21   DISCONTD: amphetamine-dextroamphetamine (ADDERALL) 10 MG tablet    Sig: Take 1 tablet (10 mg total) by mouth 2 (two) times daily.    Dispense:  60 tablet    Refill:  0    Please fill on or after 07/27/21      Follow-up: Return in about 3 months (around 10/03/2021) for a follow-up visit.  Walker Kehr, MD

## 2021-07-07 ENCOUNTER — Encounter: Payer: Self-pay | Admitting: Internal Medicine

## 2021-07-08 ENCOUNTER — Other Ambulatory Visit: Payer: Self-pay | Admitting: Internal Medicine

## 2021-07-08 MED ORDER — AMPHETAMINE-DEXTROAMPHETAMINE 10 MG PO TABS: 10.0000 mg | ORAL_TABLET | Freq: Two times a day (BID) | ORAL | 0 refills | Status: DC

## 2021-08-06 DIAGNOSIS — B070 Plantar wart: Secondary | ICD-10-CM | POA: Insufficient documentation

## 2021-08-06 NOTE — Assessment & Plan Note (Signed)
Stable  - continue on Adderall.  ? Potential benefits of a long term stimulants use as well as potential risks  and complications were explained to the patient and were aknowledged. ?

## 2021-08-06 NOTE — Assessment & Plan Note (Signed)
Resolving ?Ibuprofen as needed ?

## 2021-08-06 NOTE — Assessment & Plan Note (Signed)
Stable.  On testosterone Rx since 2010 ?Potential benefits of a long term testosterone use as well as potential risks  and complications were explained to the patient and were aknowledged. ? ?

## 2021-08-06 NOTE — Assessment & Plan Note (Signed)
Left second toe.  Options to treat discussed.  Cryosurgery. ?

## 2021-08-28 ENCOUNTER — Other Ambulatory Visit: Payer: Self-pay | Admitting: Internal Medicine

## 2021-10-03 ENCOUNTER — Ambulatory Visit: Payer: Medicare Other | Admitting: Internal Medicine

## 2021-10-04 ENCOUNTER — Other Ambulatory Visit: Payer: Self-pay | Admitting: *Deleted

## 2021-10-04 MED ORDER — TAMSULOSIN HCL 0.4 MG PO CAPS: 0.4000 mg | ORAL_CAPSULE | Freq: Every day | ORAL | 1 refills | Status: DC

## 2021-10-05 ENCOUNTER — Other Ambulatory Visit (INDEPENDENT_AMBULATORY_CARE_PROVIDER_SITE_OTHER): Payer: Medicare Other

## 2021-10-05 DIAGNOSIS — N320 Bladder-neck obstruction: Secondary | ICD-10-CM | POA: Diagnosis not present

## 2021-10-05 DIAGNOSIS — E785 Hyperlipidemia, unspecified: Secondary | ICD-10-CM | POA: Diagnosis not present

## 2021-10-05 DIAGNOSIS — E291 Testicular hypofunction: Secondary | ICD-10-CM | POA: Diagnosis not present

## 2021-10-05 DIAGNOSIS — Z0000 Encounter for general adult medical examination without abnormal findings: Secondary | ICD-10-CM

## 2021-10-05 DIAGNOSIS — I10 Essential (primary) hypertension: Secondary | ICD-10-CM

## 2021-10-05 LAB — CBC WITH DIFFERENTIAL/PLATELET
Basophils Absolute: 0.1 10*3/uL (ref 0.0–0.1)
Basophils Relative: 0.9 % (ref 0.0–3.0)
Eosinophils Absolute: 0 10*3/uL (ref 0.0–0.7)
Eosinophils Relative: 0.3 % (ref 0.0–5.0)
HCT: 51.5 % (ref 39.0–52.0)
Hemoglobin: 17.1 g/dL — ABNORMAL HIGH (ref 13.0–17.0)
Lymphocytes Relative: 14.7 % (ref 12.0–46.0)
Lymphs Abs: 1.2 10*3/uL (ref 0.7–4.0)
MCHC: 33.3 g/dL (ref 30.0–36.0)
MCV: 91.9 fl (ref 78.0–100.0)
Monocytes Absolute: 0.8 10*3/uL (ref 0.1–1.0)
Monocytes Relative: 9.3 % (ref 3.0–12.0)
Neutro Abs: 6.1 10*3/uL (ref 1.4–7.7)
Neutrophils Relative %: 74.8 % (ref 43.0–77.0)
Platelets: 353 10*3/uL (ref 150.0–400.0)
RBC: 5.6 Mil/uL (ref 4.22–5.81)
RDW: 15.3 % (ref 11.5–15.5)
WBC: 8.2 10*3/uL (ref 4.0–10.5)

## 2021-10-05 LAB — COMPREHENSIVE METABOLIC PANEL
ALT: 30 U/L (ref 0–53)
AST: 20 U/L (ref 0–37)
Albumin: 4.8 g/dL (ref 3.5–5.2)
Alkaline Phosphatase: 62 U/L (ref 39–117)
BUN: 17 mg/dL (ref 6–23)
CO2: 28 mEq/L (ref 19–32)
Calcium: 9.7 mg/dL (ref 8.4–10.5)
Chloride: 99 mEq/L (ref 96–112)
Creatinine, Ser: 1.03 mg/dL (ref 0.40–1.50)
GFR: 74.88 mL/min (ref >60.00)
Glucose, Bld: 102 mg/dL — ABNORMAL HIGH (ref 70–99)
Potassium: 4 mEq/L (ref 3.5–5.1)
Sodium: 137 mEq/L (ref 135–145)
Total Bilirubin: 1 mg/dL (ref 0.2–1.2)
Total Protein: 7.3 g/dL (ref 6.0–8.3)

## 2021-10-05 LAB — LIPID PANEL
Cholesterol: 196 mg/dL (ref 0–200)
HDL: 51.5 mg/dL (ref >39.00)
LDL Cholesterol: 127 mg/dL — ABNORMAL HIGH (ref 0–99)
NonHDL: 144.27
Total CHOL/HDL Ratio: 4
Triglycerides: 86 mg/dL (ref 0.0–149.0)
VLDL: 17.2 mg/dL (ref 0.0–40.0)

## 2021-10-05 LAB — TSH: TSH: 0.83 u[IU]/mL (ref 0.35–5.50)

## 2021-10-05 LAB — TESTOSTERONE: Testosterone: 801.03 ng/dL (ref 300.00–890.00)

## 2021-10-05 LAB — PSA: PSA: 1.81 ng/mL (ref 0.10–4.00)

## 2021-10-10 ENCOUNTER — Encounter: Payer: Self-pay | Admitting: Internal Medicine

## 2021-10-10 ENCOUNTER — Other Ambulatory Visit (INDEPENDENT_AMBULATORY_CARE_PROVIDER_SITE_OTHER): Payer: Medicare Other

## 2021-10-10 ENCOUNTER — Ambulatory Visit (INDEPENDENT_AMBULATORY_CARE_PROVIDER_SITE_OTHER): Payer: Medicare Other | Admitting: Internal Medicine

## 2021-10-10 ENCOUNTER — Other Ambulatory Visit: Payer: Self-pay | Admitting: Internal Medicine

## 2021-10-10 DIAGNOSIS — F330 Major depressive disorder, recurrent, mild: Secondary | ICD-10-CM | POA: Diagnosis not present

## 2021-10-10 DIAGNOSIS — M545 Low back pain, unspecified: Secondary | ICD-10-CM | POA: Diagnosis not present

## 2021-10-10 DIAGNOSIS — J4521 Mild intermittent asthma with (acute) exacerbation: Secondary | ICD-10-CM | POA: Diagnosis not present

## 2021-10-10 DIAGNOSIS — E291 Testicular hypofunction: Secondary | ICD-10-CM

## 2021-10-10 DIAGNOSIS — I10 Essential (primary) hypertension: Secondary | ICD-10-CM | POA: Diagnosis not present

## 2021-10-10 DIAGNOSIS — G8929 Other chronic pain: Secondary | ICD-10-CM | POA: Diagnosis not present

## 2021-10-10 DIAGNOSIS — E785 Hyperlipidemia, unspecified: Secondary | ICD-10-CM

## 2021-10-10 DIAGNOSIS — Z0000 Encounter for general adult medical examination without abnormal findings: Secondary | ICD-10-CM | POA: Diagnosis not present

## 2021-10-10 DIAGNOSIS — R3915 Urgency of urination: Secondary | ICD-10-CM | POA: Diagnosis not present

## 2021-10-10 LAB — URINALYSIS
Bilirubin Urine: NEGATIVE
Hgb urine dipstick: NEGATIVE
Ketones, ur: NEGATIVE
Leukocytes,Ua: NEGATIVE
Nitrite: NEGATIVE
Specific Gravity, Urine: <=1.005 — AB (ref 1.000–1.030)
Total Protein, Urine: NEGATIVE
Urine Glucose: NEGATIVE
Urobilinogen, UA: 0.2 (ref 0.0–1.0)
pH: 7 (ref 5.0–8.0)

## 2021-10-10 MED ORDER — NABUMETONE 750 MG PO TABS: 750.0000 mg | ORAL_TABLET | Freq: Two times a day (BID) | ORAL | 0 refills | Status: DC | PRN

## 2021-10-10 MED ORDER — AMPHETAMINE-DEXTROAMPHETAMINE 10 MG PO TABS: 10.0000 mg | ORAL_TABLET | Freq: Two times a day (BID) | ORAL | 0 refills | Status: DC

## 2021-10-10 MED ORDER — CYCLOBENZAPRINE HCL 10 MG PO TABS: 10.0000 mg | ORAL_TABLET | Freq: Three times a day (TID) | ORAL | 0 refills | Status: DC | PRN

## 2021-10-10 MED ORDER — MONTELUKAST SODIUM 10 MG PO TABS: 10.0000 mg | ORAL_TABLET | Freq: Every day | ORAL | 3 refills | Status: DC

## 2021-10-10 MED ORDER — ALBUTEROL SULFATE HFA 108 (90 BASE) MCG/ACT IN AERS: 2.0000 | INHALATION_SPRAY | RESPIRATORY_TRACT | 5 refills | Status: DC | PRN

## 2021-10-10 MED ORDER — TESTOSTERONE CYPIONATE 200 MG/ML IM SOLN: INTRAMUSCULAR | 1 refills | Status: DC

## 2021-10-10 NOTE — Assessment & Plan Note (Signed)
Start Breo - too $$$ d/c Ventolin MDI Add Singulair po

## 2021-10-10 NOTE — Assessment & Plan Note (Addendum)
On Losartan if BP is high - not taking  -Victor Montes is getting his blood pressure check weekly at [facility] where he donates platelets

## 2021-10-10 NOTE — Assessment & Plan Note (Signed)
Use AZO prn

## 2021-10-10 NOTE — Assessment & Plan Note (Signed)
Worse Flexeril, Nabumetone Rx

## 2021-10-10 NOTE — Progress Notes (Signed)
Subjective:  Patient ID: Victor Montes, male    DOB: 12/11/1955  Age: 66 y.o. MRN: [id number]  CC: No chief complaint on file.   HPI Victor Montes presents for LBP - worse, asthma - worse, hypogonadism  Outpatient Medications Prior to Visit  Medication Sig Dispense Refill   b complex vitamins tablet Take 1 tablet by mouth daily.     cetirizine (ZYRTEC) 10 MG tablet Take 10 mg by mouth daily.     guaiFENesin (MUCINEX) 600 MG 12 hr tablet Take 600 mg by mouth daily with breakfast.     KRILL OIL PO Take by mouth.     MAGNESIUM PO Take 1 tablet by mouth daily.     Multiple Vitamin (MULTIVITAMIN WITH MINERALS) TABS tablet Take 1 tablet by mouth daily.     Multiple Vitamins-Minerals (VITAMIN D3 COMPLETE PO) Take by mouth.     Probiotic Product (PROBIOTIC DAILY) CAPS Take 1 capsule by mouth daily.      sertraline (ZOLOFT) 50 MG tablet Take 1 tablet (50 mg total) by mouth daily. 90 tablet 3   SYRINGE-NEEDLE, DISP, 3 ML (BD ECLIPSE SYRINGE) 25G X 1" 3 ML MISC USE AS DIRECTED FOR IM INJECTION 50 each 11   tamsulosin (FLOMAX) 0.4 MG CAPS capsule Take 1 capsule (0.4 mg total) by mouth daily. 90 capsule 1   albuterol (VENTOLIN HFA) 108 (90 Base) MCG/ACT inhaler USE 2 INHALATIONS ORALLY   EVERY 6 HOURS AS NEEDED FORWHEEZING OR SHORTNESS OF   BREATH 17 g 5   amphetamine-dextroamphetamine (ADDERALL) 10 MG tablet Take 1 tablet (10 mg total) by mouth 2 (two) times daily. 60 tablet 0   amphetamine-dextroamphetamine (ADDERALL) 10 MG tablet Take 1 tablet (10 mg total) by mouth 2 (two) times daily. 60 tablet 0   amphetamine-dextroamphetamine (ADDERALL) 10 MG tablet Take 1 tablet (10 mg total) by mouth 2 (two) times daily. 60 tablet 0   nabumetone (RELAFEN) 500 MG tablet Take 1-2 tablets (500-1,000 mg total) by mouth daily as needed. 60 tablet 3   tadalafil (CIALIS) 5 MG tablet Take 1 tablet (5 mg total) by mouth daily. 90 tablet 3   testosterone cypionate (DEPOTESTOSTERONE CYPIONATE) 200 MG/ML injection  Inject 2 ml into the muscle every 2 weeks. 12 mL 1   tiZANidine (ZANAFLEX) 4 MG tablet TAKE ONE TABLET BY MOUTH AT BEDTIME AS NEEDED FOR MUSCLE SPASM 30 tablet 0   mirabegron ER (MYRBETRIQ) 50 MG TB24 tablet Take 1 tablet (50 mg total) by mouth daily. 90 tablet 3   No facility-administered medications prior to visit.    ROS: Review of Systems  Constitutional:  Negative for appetite change, fatigue and unexpected weight change.  HENT:  Positive for rhinorrhea. Negative for congestion, nosebleeds, sneezing, sore throat and trouble swallowing.   Eyes:  Negative for itching and visual disturbance.  Respiratory:  Positive for cough, shortness of breath and wheezing.   Cardiovascular:  Negative for chest pain, palpitations and leg swelling.  Gastrointestinal:  Negative for abdominal distention, blood in stool, diarrhea and nausea.  Genitourinary:  Negative for frequency and hematuria.  Musculoskeletal:  Positive for back pain. Negative for gait problem, joint swelling and neck pain.  Skin:  Negative for rash.  Neurological:  Negative for dizziness, tremors, speech difficulty and weakness.  Psychiatric/Behavioral:  Positive for decreased concentration. Negative for agitation, dysphoric mood, sleep disturbance and suicidal ideas. The patient is not nervous/anxious.    Objective:  BP 140/80 (BP Location: Left Arm, Patient Position: Sitting, Cuff Size:  Normal)   Pulse 84   Temp 98.6 F (37 C) (Oral)   Ht '6\' 6"'$  (1.981 m)   Wt 242 lb (109.8 kg)   SpO2 93%   BMI 27.97 kg/m   BP Readings from Last 3 Encounters:  10/10/21 140/80  07/06/21 126/63  03/30/21 124/62    Wt Readings from Last 3 Encounters:  10/10/21 242 lb (109.8 kg)  07/06/21 236 lb (107 kg)  03/30/21 234 lb (106.1 kg)    Physical Exam Constitutional:      General: He is not in acute distress.    Appearance: Normal appearance. He is well-developed. He is obese.     Comments: NAD  Eyes:     Conjunctiva/sclera:  Conjunctivae normal.     Pupils: Pupils are equal, round, and reactive to light.  Neck:     Thyroid: No thyromegaly.     Vascular: No JVD.  Cardiovascular:     Rate and Rhythm: Normal rate and regular rhythm.     Heart sounds: Normal heart sounds. No murmur heard.   No friction rub. No gallop.  Pulmonary:     Effort: Pulmonary effort is normal. No respiratory distress.     Breath sounds: Normal breath sounds. No wheezing or rales.  Chest:     Chest wall: No tenderness.  Abdominal:     General: Bowel sounds are normal. There is no distension.     Palpations: Abdomen is soft. There is no mass.     Tenderness: There is no abdominal tenderness. There is no guarding or rebound.  Musculoskeletal:        General: Tenderness present. Normal range of motion.     Cervical back: Normal range of motion.  Lymphadenopathy:     Cervical: No cervical adenopathy.  Skin:    General: Skin is warm and dry.     Findings: No rash.  Neurological:     Mental Status: He is alert and oriented to person, place, and time.     Cranial Nerves: No cranial nerve deficit.     Motor: No abnormal muscle tone.     Coordination: Coordination normal.     Gait: Gait abnormal.     Deep Tendon Reflexes: Reflexes are normal and symmetric.  Psychiatric:        Behavior: Behavior normal.        Thought Content: Thought content normal.        Judgment: Judgment normal.  LS w/pain  Lab Results  Component Value Date   WBC 8.2 10/05/2021   HGB 17.1 (H) 10/05/2021   HCT 51.5 10/05/2021   PLT 353.0 10/05/2021   GLUCOSE 102 (H) 10/05/2021   CHOL 196 10/05/2021   TRIG 86.0 10/05/2021   HDL 51.50 10/05/2021   LDLDIRECT 112.0 09/23/2019   LDLCALC 127 (H) 10/05/2021   ALT 30 10/05/2021   AST 20 10/05/2021   NA 137 10/05/2021   K 4.0 10/05/2021   CL 99 10/05/2021   CREATININE 1.03 10/05/2021   BUN 17 10/05/2021   CO2 28 10/05/2021   TSH 0.83 10/05/2021   PSA 1.81 10/05/2021   HGBA1C 5.6 07/11/2017    CT  CARDIAC SCORING  Addendum Date: 06/18/2018   ADDENDUM REPORT: 06/18/2018 17:12 CLINICAL DATA:  Risk stratification EXAM: Coronary Calcium Score TECHNIQUE: The patient was scanned on a Siemens Somatom 64 slice scanner. Axial non-contrast 3 mm slices were carried out through the heart. The data set was analyzed on a dedicated work station and scored using the  Agatson method. FINDINGS: Non-cardiac: See separate report from [facility]. Ascending aorta: Normal diameter 3.5 cm Pericardium: Normal Coronary arteries: No calcium noted IMPRESSION: Coronary calcium score of 0. Jenkins Rouge Electronically Signed   By: Jenkins Rouge M.D.   On: 06/18/2018 17:12   Result Date: 06/18/2018 EXAM: OVER-READ INTERPRETATION  CT CHEST The following report is an over-read performed by radiologist Dr. Aletta Edouard of [facility], [place] on 06/18/2018. This over-read does not include interpretation of cardiac or coronary anatomy or pathology. The coronary calcium score interpretation by the cardiologist is attached. COMPARISON:  None. FINDINGS: Vascular: No incidental findings. Mediastinum/Nodes: Visualized mediastinum and hilar regions show no evidence of lymphadenopathy or masses. Lungs/Pleura: Visualized lungs show no evidence of pulmonary edema, consolidation, pneumothorax, nodule or pleural fluid. Upper Abdomen: No acute abnormality. Musculoskeletal: Mild rightward convex scoliosis. Other: Bilateral gynecomastia. IMPRESSION: No significant incidental findings.  Bilateral gynecomastia present. Electronically Signed: By: Aletta Edouard M.D. On: 06/18/2018 17:02    Assessment & Plan:   Problem List Items Addressed This Visit     Asthmatic bronchitis    Start Breo - too $$$ d/c Ventolin MDI Add Singulair po       Relevant Medications   albuterol (VENTOLIN HFA) 108 (90 Base) MCG/ACT inhaler   montelukast (SINGULAIR) 10 MG tablet   Depression     Depression - better on Fisher Wallace device.  Continue  Zoloft      Hypertension    On Losartan if BP is high - not taking  -Maveric is getting his blood pressure check weekly at [facility] where he donates platelets       Low back pain    Worse Flexeril, Nabumetone Rx       Relevant Medications   nabumetone (RELAFEN) 750 MG tablet   cyclobenzaprine (FLEXERIL) 10 MG tablet   Urinary urgency    Use AZO prn          Meds ordered this encounter  Medications   testosterone cypionate (DEPOTESTOSTERONE CYPIONATE) 200 MG/ML injection    Sig: Inject 2m into the muscle every 2 weeks.    Dispense:  12 mL    Refill:  1   albuterol (VENTOLIN HFA) 108 (90 Base) MCG/ACT inhaler    Sig: Inhale 2 puffs into the lungs every 4 (four) hours as needed for wheezing or shortness of breath.    Dispense:  17 g    Refill:  5   amphetamine-dextroamphetamine (ADDERALL) 10 MG tablet    Sig: Take 1 tablet (10 mg total) by mouth 2 (two) times daily.    Dispense:  60 tablet    Refill:  0    Please fill on or after 10/25/21   amphetamine-dextroamphetamine (ADDERALL) 10 MG tablet    Sig: Take 1 tablet (10 mg total) by mouth 2 (two) times daily.    Dispense:  60 tablet    Refill:  0    Please fill on or after 11/24/21   amphetamine-dextroamphetamine (ADDERALL) 10 MG tablet    Sig: Take 1 tablet (10 mg total) by mouth 2 (two) times daily.    Dispense:  60 tablet    Refill:  0    Please fill on or after 12/24/21   montelukast (SINGULAIR) 10 MG tablet    Sig: Take 1 tablet (10 mg total) by mouth daily.    Dispense:  90 tablet    Refill:  3   nabumetone (RELAFEN) 750 MG tablet    Sig: Take 1 tablet (  750 mg total) by mouth 2 (two) times daily as needed for moderate pain.    Dispense:  180 tablet    Refill:  0   cyclobenzaprine (FLEXERIL) 10 MG tablet    Sig: Take 1 tablet (10 mg total) by mouth 3 (three) times daily as needed for muscle spasms.    Dispense:  180 tablet    Refill:  0      Follow-up: Return in about 3 months (around 01/10/2022) for a  follow-up visit.  Walker Kehr, MD

## 2021-10-10 NOTE — Assessment & Plan Note (Signed)
Depression - better on Fisher Wallace device.  Continue Zoloft

## 2021-11-27 ENCOUNTER — Other Ambulatory Visit: Payer: Self-pay | Admitting: Internal Medicine

## 2021-12-21 ENCOUNTER — Other Ambulatory Visit: Payer: Self-pay | Admitting: Internal Medicine

## 2022-01-09 ENCOUNTER — Ambulatory Visit (INDEPENDENT_AMBULATORY_CARE_PROVIDER_SITE_OTHER): Payer: Medicare Other

## 2022-01-09 DIAGNOSIS — Z0000 Encounter for general adult medical examination without abnormal findings: Secondary | ICD-10-CM | POA: Diagnosis not present

## 2022-01-09 NOTE — Patient Instructions (Signed)
Victor Montes , Thank you for taking time to come for your Medicare Wellness Visit. I appreciate your ongoing commitment to your health goals. Please review the following plan we discussed and let me know if I can assist you in the future.   Screening recommendations/referrals: Colonoscopy: 02/11/2020  due 01/2023 Recommended yearly ophthalmology/optometry visit for glaucoma screening and checkup Recommended yearly dental visit for hygiene and checkup  Vaccinations: Influenza vaccine: completed  Pneumococcal vaccine: due  Tdap vaccine: due  Shingles vaccine: will consider     Advanced directives: none   Conditions/risks identified: none   Next appointment: none   Preventive Care 54 Years and Older, Male Preventive care refers to lifestyle choices and visits with your health care provider that can promote health and wellness. What does preventive care include? A yearly physical exam. This is also called an annual well check. Dental exams once or twice a year. Routine eye exams. Ask your health care provider how often you should have your eyes checked. Personal lifestyle choices, including: Daily care of your teeth and gums. Regular physical activity. Eating a healthy diet. Avoiding tobacco and drug use. Limiting alcohol use. Practicing safe sex. Taking low doses of aspirin every day. Taking vitamin and mineral supplements as recommended by your health care provider. What happens during an annual well check? The services and screenings done by your health care provider during your annual well check will depend on your age, overall health, lifestyle risk factors, and family history of disease. Counseling  Your health care provider may ask you questions about your: Alcohol use. Tobacco use. Drug use. Emotional well-being. Home and relationship well-being. Sexual activity. Eating habits. History of falls. Memory and ability to understand (cognition). Work and work  Statistician. Screening  You may have the following tests or measurements: Height, weight, and BMI. Blood pressure. Lipid and cholesterol levels. These may be checked every 5 years, or more frequently if you are over 78 years old. Skin check. Lung cancer screening. You may have this screening every year starting at age 59 if you have a 30-pack-year history of smoking and currently smoke or have quit within the past 15 years. Fecal occult blood test (FOBT) of the stool. You may have this test every year starting at age 68. Flexible sigmoidoscopy or colonoscopy. You may have a sigmoidoscopy every 5 years or a colonoscopy every 10 years starting at age 68. Prostate cancer screening. Recommendations will vary depending on your family history and other risks. Hepatitis C blood test. Hepatitis B blood test. Sexually transmitted disease (STD) testing. Diabetes screening. This is done by checking your blood sugar (glucose) after you have not eaten for a while (fasting). You may have this done every 1-3 years. Abdominal aortic aneurysm (AAA) screening. You may need this if you are a current or former smoker. Osteoporosis. You may be screened starting at age 68 if you are at high risk. Talk with your health care provider about your test results, treatment options, and if necessary, the need for more tests. Vaccines  Your health care provider may recommend certain vaccines, such as: Influenza vaccine. This is recommended every year. Tetanus, diphtheria, and acellular pertussis (Tdap, Td) vaccine. You may need a Td booster every 10 years. Zoster vaccine. You may need this after age 68. Pneumococcal 13-valent conjugate (PCV13) vaccine. One dose is recommended after age 68. Pneumococcal polysaccharide (PPSV23) vaccine. One dose is recommended after age 68. Talk to your health care provider about which screenings and vaccines you need  and how often you need them. This information is not intended to replace  advice given to you by your health care provider. Make sure you discuss any questions you have with your health care provider. Document Released: 06/04/2015 Document Revised: 01/26/2016 Document Reviewed: 03/09/2015 Elsevier Interactive Patient Education  2017 [place] Prevention in the Home Falls can cause injuries. They can happen to people of all ages. There are many things you can do to make your home safe and to help prevent falls. What can I do on the outside of my home? Regularly fix the edges of walkways and driveways and fix any cracks. Remove anything that might make you trip as you walk through a door, such as a raised step or threshold. Trim any bushes or trees on the path to your home. Use bright outdoor lighting. Clear any walking paths of anything that might make someone trip, such as rocks or tools. Regularly check to see if handrails are loose or broken. Make sure that both sides of any steps have handrails. Any raised decks and porches should have guardrails on the edges. Have any leaves, snow, or ice cleared regularly. Use sand or salt on walking paths during winter. Clean up any spills in your garage right away. This includes oil or grease spills. What can I do in the bathroom? Use night lights. Install grab bars by the toilet and in the tub and shower. Do not use towel bars as grab bars. Use non-skid mats or decals in the tub or shower. If you need to sit down in the shower, use a plastic, non-slip stool. Keep the floor dry. Clean up any water that spills on the floor as soon as it happens. Remove soap buildup in the tub or shower regularly. Attach bath mats securely with double-sided non-slip rug tape. Do not have throw rugs and other things on the floor that can make you trip. What can I do in the bedroom? Use night lights. Make sure that you have a light by your bed that is easy to reach. Do not use any sheets or blankets that are too big for your bed.  They should not hang down onto the floor. Have a firm chair that has side arms. You can use this for support while you get dressed. Do not have throw rugs and other things on the floor that can make you trip. What can I do in the kitchen? Clean up any spills right away. Avoid walking on wet floors. Keep items that you use a lot in easy-to-reach places. If you need to reach something above you, use a strong step stool that has a grab bar. Keep electrical cords out of the way. Do not use floor polish or wax that makes floors slippery. If you must use wax, use non-skid floor wax. Do not have throw rugs and other things on the floor that can make you trip. What can I do with my stairs? Do not leave any items on the stairs. Make sure that there are handrails on both sides of the stairs and use them. Fix handrails that are broken or loose. Make sure that handrails are as long as the stairways. Check any carpeting to make sure that it is firmly attached to the stairs. Fix any carpet that is loose or worn. Avoid having throw rugs at the top or bottom of the stairs. If you do have throw rugs, attach them to the floor with carpet tape. Make sure that you  have a light switch at the top of the stairs and the bottom of the stairs. If you do not have them, ask someone to add them for you. What else can I do to help prevent falls? Wear shoes that: Do not have high heels. Have rubber bottoms. Are comfortable and fit you well. Are closed at the toe. Do not wear sandals. If you use a stepladder: Make sure that it is fully opened. Do not climb a closed stepladder. Make sure that both sides of the stepladder are locked into place. Ask someone to hold it for you, if possible. Clearly [place] and make sure that you can see: Any grab bars or handrails. First and last steps. Where the edge of each step is. Use tools that help you move around (mobility aids) if they are needed. These  include: Canes. Walkers. Scooters. Crutches. Turn on the lights when you go into a dark area. Replace any light bulbs as soon as they burn out. Set up your furniture so you have a clear path. Avoid moving your furniture around. If any of your floors are uneven, fix them. If there are any pets around you, be aware of where they are. Review your medicines with your doctor. Some medicines can make you feel dizzy. This can increase your chance of falling. Ask your doctor what other things that you can do to help prevent falls. This information is not intended to replace advice given to you by your health care provider. Make sure you discuss any questions you have with your health care provider. Document Released: 03/04/2009 Document Revised: 10/14/2015 Document Reviewed: 06/12/2014 Elsevier Interactive Patient Education  2017 [employer].

## 2022-01-09 NOTE — Progress Notes (Cosign Needed Addendum)
Subjective:   Victor Montes is a 66 y.o. male who presents for an Subsequent Medicare Annual Wellness Visit.   I connected with Victor Montes  today by telephone and verified that I am speaking with the correct person using two identifiers. Location patient: home Location provider: work Persons participating in the virtual visit: patient, provider.   I discussed the limitations, risks, security and privacy concerns of performing an evaluation and management service by telephone and the availability of in person appointments. I also discussed with the patient that there may be a patient responsible charge related to this service. The patient expressed understanding and verbally consented to this telephonic visit.    Interactive audio and video telecommunications were attempted between this provider and patient, however failed, due to patient having technical difficulties OR patient did not have access to video capability.  We continued and completed visit with audio only.    Review of Systems           Objective:    Today's Vitals   There is no height or weight on file to calculate BMI.     01/09/2022   11:00 AM 12/27/2020    9:35 AM 06/09/2016    9:21 AM  Advanced Directives  Does Patient Have a Medical Advance Directive? No No No  Would patient like information on creating a medical advance directive? No - Patient declined No - Patient declined No - Patient declined    Current Medications (verified) Outpatient Encounter Medications as of 01/09/2022  Medication Sig   albuterol (VENTOLIN HFA) 108 (90 Base) MCG/ACT inhaler Inhale 2 puffs into the lungs every 4 (four) hours as needed for wheezing or shortness of breath.   amphetamine-dextroamphetamine (ADDERALL) 10 MG tablet Take 1 tablet (10 mg total) by mouth 2 (two) times daily.   b complex vitamins tablet Take 1 tablet by mouth daily.   cetirizine (ZYRTEC) 10 MG tablet Take 10 mg by mouth daily.   cyclobenzaprine (FLEXERIL)  10 MG tablet Take 1 tablet by mouth three times daily as needed for muscle spasms.   guaiFENesin (MUCINEX) 600 MG 12 hr tablet Take 600 mg by mouth daily with breakfast.   KRILL OIL PO Take by mouth.   MAGNESIUM PO Take 1 tablet by mouth daily.   montelukast (SINGULAIR) 10 MG tablet Take 1 tablet (10 mg total) by mouth daily.   Multiple Vitamin (MULTIVITAMIN WITH MINERALS) TABS tablet Take 1 tablet by mouth daily.   nabumetone (RELAFEN) 750 MG tablet Take 1 tablet by mouth twice daily as needed for moderate pain.   sertraline (ZOLOFT) 50 MG tablet Take 1 tablet (50 mg total) by mouth daily.   SYRINGE-NEEDLE, DISP, 3 ML (BD ECLIPSE SYRINGE) 25G X 1" 3 ML MISC USE AS DIRECTED FOR IM INJECTION   tadalafil (CIALIS) 5 MG tablet Take 1 tablet by mouth daily.   tamsulosin (FLOMAX) 0.4 MG CAPS capsule Take 1 capsule (0.4 mg total) by mouth daily.   testosterone cypionate (DEPOTESTOSTERONE CYPIONATE) 200 MG/ML injection Inject 56m into the muscle every 2 weeks.   amphetamine-dextroamphetamine (ADDERALL) 10 MG tablet Take 1 tablet (10 mg total) by mouth 2 (two) times daily.   amphetamine-dextroamphetamine (ADDERALL) 10 MG tablet Take 1 tablet (10 mg total) by mouth 2 (two) times daily.   Multiple Vitamins-Minerals (VITAMIN D3 COMPLETE PO) Take by mouth.   Probiotic Product (PROBIOTIC DAILY) CAPS Take 1 capsule by mouth daily.    No facility-administered encounter medications on file as of 01/09/2022.  Allergies (verified) Patient has no known allergies.   History: Past Medical History:  Diagnosis Date   ADD (attention deficit disorder)    Allergic rhinitis    Allergy    Asthma    Cancer ([place])    basal cell skin cancer    Cervical radiculopathy 2007   Right- Dr. Hal Neer    Constipation 2011   Glucose intolerance (impaired glucose tolerance)    Heart murmur    Hyperlipidemia    Hypogonadism male    Occipital neuralgia 2011   Left   OSA (obstructive sleep apnea)    Sleep apnea    no  cpap currently    Past Surgical History:  Procedure Laterality Date   COLONOSCOPY     POLYPECTOMY     UVULOPALATOPLASTY     s/p   Family History  Problem Relation Age of Onset   Colon cancer Mother 93   Cancer Mother 49       colon ca   Stroke Father    Heart disease Father        A fib   Hypertension Other    Coronary artery disease Neg Hx    Colon polyps Neg Hx    Esophageal cancer Neg Hx    Rectal cancer Neg Hx    Stomach cancer Neg Hx    Social History   Socioeconomic History   Marital status: Single    Spouse name: Not on file   Number of children: Not on file   Years of education: Not on file   Highest education level: Not on file  Occupational History   Not on file  Tobacco Use   Smoking status: Some Days    Packs/day: 0.10    Years: 20.00    Total pack years: 2.00    Types: E-cigarettes, Cigarettes   Smokeless tobacco: Never   Tobacco comments:    does vape    Vaping Use   Vaping Use: Some days   Substances: Nicotine  Substance and Sexual Activity   Alcohol use: Yes    Alcohol/week: 3.0 standard drinks of alcohol    Types: 3 Cans of beer per week   Drug use: No   Sexual activity: Yes  Other Topics Concern   Not on file  Social History Narrative   Not on file   Social Determinants of Health   Financial Resource Strain: Low Risk  (01/09/2022)   Overall Financial Resource Strain (CARDIA)    Difficulty of Paying Living Expenses: Not hard at all  Food Insecurity: No Food Insecurity (01/09/2022)   Hunger Vital Sign    Worried About Running Out of Food in the Last Year: Never true    Ran Out of Food in the Last Year: Never true  Transportation Needs: No Transportation Needs (01/09/2022)   PRAPARE - Hydrologist (Medical): No    Lack of Transportation (Non-Medical): No  Physical Activity: Sufficiently Active (01/09/2022)   Exercise Vital Sign    Days of Exercise per Week: 3 days    Minutes of Exercise per Session: 60 min   Stress: No Stress Concern Present (01/09/2022)   [place]    Feeling of Stress : Not at all  Social Connections: Socially Isolated (01/09/2022)   Social Connection and Isolation Panel [NHANES]    Frequency of Communication with Friends and Family: Three times a week    Frequency of Social Gatherings with Friends and Family:  Three times a week    Attends Religious Services: Never    Active Member of Clubs or Organizations: No    Attends Music therapist: Never    Marital Status: Never married    Tobacco Counseling Ready to quit: Not Answered Counseling given: Not Answered Tobacco comments: does vape     Clinical Intake:  Pre-visit preparation completed: Yes  Pain : No/denies pain     Nutritional Risks: None Diabetes: No  How often do you need to have someone help you when you read instructions, pamphlets, or other written materials from your doctor or pharmacy?: 1 - Never What is the last grade level you completed in school?: college  Diabetic?no   Interpreter Needed?: No  Information entered by :: L.Wilson,LPN   Activities of Daily Living     No data to display          Patient Care Team: Plotnikov, Evie Lacks, MD as PCP - General  Indicate any recent Medical Services you may have received from other than [facility] providers in the past year (date may be approximate).     Assessment:   This is a routine wellness examination for [employer].  Hearing/Vision screen Vision Screening - Comments:: Annual eye exams wears contacts   Dietary issues and exercise activities discussed:     Goals Addressed   None    Depression Screen    01/09/2022   11:01 AM 01/09/2022   11:00 AM 01/09/2022   10:58 AM 10/10/2021    2:53 PM 03/30/2021    3:08 PM 12/27/2020    9:34 AM 06/30/2020    8:37 AM  PHQ 2/9 Scores  PHQ - 2 Score 0 0 0 1 0 0 0  PHQ- 9 Score    2 0  1    Fall Risk    01/09/2022    11:01 AM 10/10/2021    2:53 PM 12/27/2020    9:38 AM 06/30/2020    8:38 AM  [place] in the past year? 0 0 0 0  Number falls in past yr: 0 0 0 0  Injury with Fall? 0 0 0 0  Risk for fall due to :  No Fall Risks No Fall Risks No Fall Risks  Follow up Falls evaluation completed;Education provided Falls evaluation completed Falls evaluation completed     [place]:  Any stairs in or around the home? No  If so, are there any without handrails? No  Home free of loose throw rugs in walkways, pet beds, electrical cords, etc? Yes  Adequate lighting in your home to reduce risk of falls? Yes   ASSISTIVE DEVICES UTILIZED TO PREVENT FALLS:  Life alert? No  Use of a cane, walker or w/c? No  Grab bars in the bathroom? Yes  Shower chair or bench in shower? No  Elevated toilet seat or a handicapped toilet? No     Cognitive Function:    Normal cognitive status assessed by telephone conversation  by this Nurse Health Advisor. No abnormalities found.      Immunizations Immunization History  Administered Date(s) Administered   Fluad Quad(high Dose 65+) 03/31/2019, 03/30/2020, 03/30/2021   Influenza Split 03/01/2011   Influenza,inj,Quad PF,6+ Mos 01/16/2014, 07/05/2016, 02/04/2018   Influenza-Unspecified 03/17/2013   PFIZER(Purple Top)SARS-COV-2 Vaccination 07/21/2019, 08/19/2019, 03/05/2020   Tdap 09/20/2011    TDAP status: Due, Education has been provided regarding the importance of this vaccine. Advised may receive this vaccine at local  pharmacy or Health Dept. Aware to provide a copy of the vaccination record if obtained from local pharmacy or Health Dept. Verbalized acceptance and understanding.  Flu Vaccine status: Up to date  Pneumococcal vaccine status: Due, Education has been provided regarding the importance of this vaccine. Advised may receive this vaccine at local pharmacy or Health Dept. Aware to provide a copy of the vaccination record  if obtained from local pharmacy or Health Dept. Verbalized acceptance and understanding.  Covid-19 vaccine status: Completed vaccines  Qualifies for Shingles Vaccine? Yes   Zostavax completed No   Shingrix Completed?: No.    Education has been provided regarding the importance of this vaccine. Patient has been advised to call insurance company to determine out of pocket expense if they have not yet received this vaccine. Advised may also receive vaccine at local pharmacy or Health Dept. Verbalized acceptance and understanding.  Screening Tests Health Maintenance  Topic Date Due   Pneumonia Vaccine 8+ Years old (1 - PCV) Never done   Zoster Vaccines- Shingrix (1 of 2) Never done   COVID-19 Vaccine (4 - Pfizer risk series) 04/30/2020   TETANUS/TDAP  09/19/2021   INFLUENZA VACCINE  12/20/2021   COLONOSCOPY (Pts 45-59yr Insurance coverage will need to be confirmed)  02/11/2023   Hepatitis C Screening  Completed   HPV VACCINES  Aged Out    Health Maintenance  Health Maintenance Due  Topic Date Due   Pneumonia Vaccine 68 Years old (1 - PCV) Never done   Zoster Vaccines- Shingrix (1 of 2) Never done   COVID-19 Vaccine (4 - Pfizer risk series) 04/30/2020   TETANUS/TDAP  09/19/2021   INFLUENZA VACCINE  12/20/2021    Colorectal cancer screening: Type of screening: Colonoscopy. Completed 02/11/2020. Repeat every 3 years  Lung Cancer Screening: (Low Dose CT Chest recommended if Age 68-80years, 30 pack-year currently smoking OR have quit w/in 15years.) does not qualify.   Lung Cancer Screening Referral: n/a  Additional Screening:  Hepatitis C Screening: does not qualify;   Vision Screening: Recommended annual ophthalmology exams for early detection of glaucoma and other disorders of the eye. Is the patient up to date with their annual eye exam?  Yes  Who is the provider or what is the name of the office in which the patient attends annual eye exams? [facility] [place] If pt is  not established with a provider, would they like to be referred to a provider to establish care? No .   Dental Screening: Recommended annual dental exams for proper oral hygiene  Community Resource Referral / Chronic Care Management: CRR required this visit?  No   CCM required this visit?  No      Plan:     I have personally reviewed and noted the following in the patient's chart:   Medical and social history Use of alcohol, tobacco or illicit drugs  Current medications and supplements including opioid prescriptions. Patient is not currently taking opioid prescriptions. Functional ability and status Nutritional status Physical activity Advanced directives List of other physicians Hospitalizations, surgeries, and ER visits in previous 12 months Vitals Screenings to include cognitive, depression, and falls Referrals and appointments  In addition, I have reviewed and discussed with patient certain preventive protocols, quality metrics, and best practice recommendations. A written personalized care plan for preventive services as well as general preventive health recommendations were provided to patient.     LDaphane Shepherd LPN   [id number]  Nurse Notes: none  Medical screening examination/treatment/procedure(s) were performed by non-physician practitioner and as supervising physician I was immediately available for consultation/collaboration.  I agree with above. Lew Dawes, MD

## 2022-01-10 ENCOUNTER — Encounter: Payer: Self-pay | Admitting: Internal Medicine

## 2022-01-10 ENCOUNTER — Ambulatory Visit (INDEPENDENT_AMBULATORY_CARE_PROVIDER_SITE_OTHER): Payer: Medicare Other | Admitting: Internal Medicine

## 2022-01-10 DIAGNOSIS — N3281 Overactive bladder: Secondary | ICD-10-CM | POA: Diagnosis not present

## 2022-01-10 DIAGNOSIS — G8929 Other chronic pain: Secondary | ICD-10-CM | POA: Diagnosis not present

## 2022-01-10 DIAGNOSIS — F988 Other specified behavioral and emotional disorders with onset usually occurring in childhood and adolescence: Secondary | ICD-10-CM

## 2022-01-10 DIAGNOSIS — F330 Major depressive disorder, recurrent, mild: Secondary | ICD-10-CM

## 2022-01-10 DIAGNOSIS — M545 Low back pain, unspecified: Secondary | ICD-10-CM | POA: Diagnosis not present

## 2022-01-10 MED ORDER — TRAMADOL HCL 50 MG PO TABS: 50.0000 mg | ORAL_TABLET | Freq: Four times a day (QID) | ORAL | 1 refills | Status: AC | PRN

## 2022-01-10 MED ORDER — AMPHETAMINE-DEXTROAMPHETAMINE 10 MG PO TABS: 10.0000 mg | ORAL_TABLET | Freq: Two times a day (BID) | ORAL | 0 refills | Status: DC

## 2022-01-10 NOTE — Progress Notes (Signed)
Subjective:  Patient ID: Victor Montes, male    DOB: Dec 31, 1955  Age: 66 y.o. MRN: [id number]  CC: Follow-up (3 month f/u)   HPI Victor Montes presents for LBP 7/10, B hip pain  - had B hip injections. F/u on hypogonadism, ADD, depression  Outpatient Medications Prior to Visit  Medication Sig Dispense Refill   albuterol (VENTOLIN HFA) 108 (90 Base) MCG/ACT inhaler Inhale 2 puffs into the lungs every 4 (four) hours as needed for wheezing or shortness of breath. 17 g 5   b complex vitamins tablet Take 1 tablet by mouth daily.     cetirizine (ZYRTEC) 10 MG tablet Take 10 mg by mouth daily.     cyclobenzaprine (FLEXERIL) 10 MG tablet Take 1 tablet by mouth three times daily as needed for muscle spasms. 180 tablet 0   guaiFENesin (MUCINEX) 600 MG 12 hr tablet Take 600 mg by mouth daily with breakfast.     KRILL OIL PO Take by mouth.     MAGNESIUM PO Take 1 tablet by mouth daily.     montelukast (SINGULAIR) 10 MG tablet Take 1 tablet (10 mg total) by mouth daily. 90 tablet 3   Multiple Vitamin (MULTIVITAMIN WITH MINERALS) TABS tablet Take 1 tablet by mouth daily.     nabumetone (RELAFEN) 750 MG tablet Take 1 tablet by mouth twice daily as needed for moderate pain. 180 tablet 0   sertraline (ZOLOFT) 50 MG tablet Take 1 tablet (50 mg total) by mouth daily. 90 tablet 3   SYRINGE-NEEDLE, DISP, 3 ML (BD ECLIPSE SYRINGE) 25G X 1" 3 ML MISC USE AS DIRECTED FOR IM INJECTION 50 each 11   tadalafil (CIALIS) 5 MG tablet Take 1 tablet by mouth daily. 90 tablet 1   tamsulosin (FLOMAX) 0.4 MG CAPS capsule Take 1 capsule (0.4 mg total) by mouth daily. 90 capsule 1   testosterone cypionate (DEPOTESTOSTERONE CYPIONATE) 200 MG/ML injection Inject 77m into the muscle every 2 weeks. 12 mL 1   amphetamine-dextroamphetamine (ADDERALL) 10 MG tablet Take 1 tablet (10 mg total) by mouth 2 (two) times daily. 60 tablet 0   amphetamine-dextroamphetamine (ADDERALL) 10 MG tablet Take 1 tablet (10 mg total) by mouth 2 (two)  times daily. 60 tablet 0   amphetamine-dextroamphetamine (ADDERALL) 10 MG tablet Take 1 tablet (10 mg total) by mouth 2 (two) times daily. 60 tablet 0   Multiple Vitamins-Minerals (VITAMIN D3 COMPLETE PO) Take by mouth.     Probiotic Product (PROBIOTIC DAILY) CAPS Take 1 capsule by mouth daily.      No facility-administered medications prior to visit.    ROS: Review of Systems  Constitutional:  Negative for appetite change, fatigue and unexpected weight change.  HENT:  Negative for congestion, nosebleeds, sneezing, sore throat and trouble swallowing.   Eyes:  Negative for itching and visual disturbance.  Respiratory:  Negative for cough.   Cardiovascular:  Negative for chest pain, palpitations and leg swelling.  Gastrointestinal:  Negative for abdominal distention, blood in stool, diarrhea and nausea.  Genitourinary:  Negative for frequency and hematuria.  Musculoskeletal:  Positive for arthralgias, back pain and gait problem. Negative for joint swelling and neck pain.  Skin:  Negative for rash.  Neurological:  Negative for dizziness, tremors, speech difficulty and weakness.  Psychiatric/Behavioral:  Positive for decreased concentration. Negative for agitation, dysphoric mood, sleep disturbance and suicidal ideas. The patient is not nervous/anxious.     Objective:  BP (!) 140/82 (BP Location: Left Arm)   Pulse 78  Temp 98.7 F (37.1 C) (Oral)   Ht '6\' 6"'$  (1.981 m)   Wt 245 lb 6.4 oz (111.3 kg)   SpO2 97%   BMI 28.36 kg/m   BP Readings from Last 3 Encounters:  01/10/22 (!) 140/82  10/10/21 140/80  07/06/21 126/63    Wt Readings from Last 3 Encounters:  01/10/22 245 lb 6.4 oz (111.3 kg)  10/10/21 242 lb (109.8 kg)  07/06/21 236 lb (107 kg)    Physical Exam Constitutional:      General: He is not in acute distress.    Appearance: He is well-developed.     Comments: NAD  Eyes:     Conjunctiva/sclera: Conjunctivae normal.     Pupils: Pupils are equal, round, and  reactive to light.  Neck:     Thyroid: No thyromegaly.     Vascular: No JVD.  Cardiovascular:     Rate and Rhythm: Normal rate and regular rhythm.     Heart sounds: Normal heart sounds. No murmur heard.    No friction rub. No gallop.  Pulmonary:     Effort: Pulmonary effort is normal. No respiratory distress.     Breath sounds: Normal breath sounds. No wheezing or rales.  Chest:     Chest wall: No tenderness.  Abdominal:     General: Bowel sounds are normal. There is no distension.     Palpations: Abdomen is soft. There is no mass.     Tenderness: There is no abdominal tenderness. There is no guarding or rebound.  Musculoskeletal:        General: Tenderness present. Normal range of motion.     Cervical back: Normal range of motion.  Lymphadenopathy:     Cervical: No cervical adenopathy.  Skin:    General: Skin is warm and dry.     Findings: No rash.  Neurological:     Mental Status: He is alert and oriented to person, place, and time.     Cranial Nerves: No cranial nerve deficit.     Motor: No abnormal muscle tone.     Coordination: Coordination normal.     Gait: Gait normal.     Deep Tendon Reflexes: Reflexes are normal and symmetric.  Psychiatric:        Behavior: Behavior normal.        Thought Content: Thought content normal.        Judgment: Judgment normal.   LS w/pain  Lab Results  Component Value Date   WBC 8.2 10/05/2021   HGB 17.1 (H) 10/05/2021   HCT 51.5 10/05/2021   PLT 353.0 10/05/2021   GLUCOSE 102 (H) 10/05/2021   CHOL 196 10/05/2021   TRIG 86.0 10/05/2021   HDL 51.50 10/05/2021   LDLDIRECT 112.0 09/23/2019   LDLCALC 127 (H) 10/05/2021   ALT 30 10/05/2021   AST 20 10/05/2021   NA 137 10/05/2021   K 4.0 10/05/2021   CL 99 10/05/2021   CREATININE 1.03 10/05/2021   BUN 17 10/05/2021   CO2 28 10/05/2021   TSH 0.83 10/05/2021   PSA 1.81 10/05/2021   HGBA1C 5.6 07/11/2017    CT CARDIAC SCORING  Addendum Date: 06/18/2018   ADDENDUM REPORT:  06/18/2018 17:12 CLINICAL DATA:  Risk stratification EXAM: Coronary Calcium Score TECHNIQUE: The patient was scanned on a Siemens Somatom 64 slice scanner. Axial non-contrast 3 mm slices were carried out through the heart. The data set was analyzed on a dedicated work station and scored using the Center. FINDINGS: Non-cardiac: See  separate report from [facility]. Ascending aorta: Normal diameter 3.5 cm Pericardium: Normal Coronary arteries: No calcium noted IMPRESSION: Coronary calcium score of 0. Jenkins Rouge Electronically Signed   By: Jenkins Rouge M.D.   On: 06/18/2018 17:12   Result Date: 06/18/2018 EXAM: OVER-READ INTERPRETATION  CT CHEST The following report is an over-read performed by radiologist Dr. Aletta Edouard of [facility], [place] on 06/18/2018. This over-read does not include interpretation of cardiac or coronary anatomy or pathology. The coronary calcium score interpretation by the cardiologist is attached. COMPARISON:  None. FINDINGS: Vascular: No incidental findings. Mediastinum/Nodes: Visualized mediastinum and hilar regions show no evidence of lymphadenopathy or masses. Lungs/Pleura: Visualized lungs show no evidence of pulmonary edema, consolidation, pneumothorax, nodule or pleural fluid. Upper Abdomen: No acute abnormality. Musculoskeletal: Mild rightward convex scoliosis. Other: Bilateral gynecomastia. IMPRESSION: No significant incidental findings.  Bilateral gynecomastia present. Electronically Signed: By: Aletta Edouard M.D. On: 06/18/2018 17:02    Assessment & Plan:   Problem List Items Addressed This Visit     Attention deficit disorder    Continue on Adderall.   Potential benefits of a long term stimulants use as well as potential risks  and complications were explained to the patient and were aknowledged.      Depression     Continue on Zoloft      Low back pain    Cont on Flexeril, Nabumetone Rx prn  He was in a LBP drug study for facet  joint OA (Facet joints injections XT150 - Inerleukin 10 related Rx) [place] - finished.      Relevant Medications   traMADol (ULTRAM) 50 MG tablet   OAB (overactive bladder)    [facility] ref was offered         Meds ordered this encounter  Medications   amphetamine-dextroamphetamine (ADDERALL) 10 MG tablet    Sig: Take 1 tablet (10 mg total) by mouth 2 (two) times daily.    Dispense:  60 tablet    Refill:  0    Please fill on or after 03/24/22   amphetamine-dextroamphetamine (ADDERALL) 10 MG tablet    Sig: Take 1 tablet (10 mg total) by mouth 2 (two) times daily.    Dispense:  60 tablet    Refill:  0    Please fill on or after 02/22/22   amphetamine-dextroamphetamine (ADDERALL) 10 MG tablet    Sig: Take 1 tablet (10 mg total) by mouth 2 (two) times daily.    Dispense:  60 tablet    Refill:  0    Please fill on or after 01/23/22   traMADol (ULTRAM) 50 MG tablet    Sig: Take 1-2 tablets (50-100 mg total) by mouth every 6 (six) hours as needed for up to 5 days for severe pain.    Dispense:  40 tablet    Refill:  1      Follow-up: Return in about 3 months (around 04/12/2022) for a follow-up visit.  Walker Kehr, MD

## 2022-01-10 NOTE — Assessment & Plan Note (Signed)
Continue on Adderall.   Potential benefits of a long term stimulants use as well as potential risks  and complications were explained to the patient and were aknowledged. 

## 2022-01-10 NOTE — Assessment & Plan Note (Signed)
[facility] Urology ref was offered

## 2022-01-10 NOTE — Assessment & Plan Note (Addendum)
Cont on Flexeril, Nabumetone Rx prn  He was in a LBP drug study for facet joint OA (Facet joints injections XT150 - Inerleukin 10 related Rx) [place] - finished.

## 2022-01-10 NOTE — Assessment & Plan Note (Signed)
Continue on Zoloft 

## 2022-03-02 ENCOUNTER — Other Ambulatory Visit: Payer: Self-pay | Admitting: Internal Medicine

## 2022-03-27 ENCOUNTER — Other Ambulatory Visit: Payer: Self-pay | Admitting: Internal Medicine

## 2022-03-27 ENCOUNTER — Encounter: Payer: Self-pay | Admitting: Internal Medicine

## 2022-03-27 ENCOUNTER — Ambulatory Visit (INDEPENDENT_AMBULATORY_CARE_PROVIDER_SITE_OTHER): Payer: Medicare Other | Admitting: Internal Medicine

## 2022-03-27 VITALS — BP 162/80 | HR 102 | Temp 98.8°F | Ht 78.0 in | Wt 246.8 lb

## 2022-03-27 DIAGNOSIS — M6740 Ganglion, unspecified site: Secondary | ICD-10-CM | POA: Diagnosis not present

## 2022-03-27 DIAGNOSIS — I10 Essential (primary) hypertension: Secondary | ICD-10-CM

## 2022-03-27 DIAGNOSIS — N320 Bladder-neck obstruction: Secondary | ICD-10-CM | POA: Diagnosis not present

## 2022-03-27 DIAGNOSIS — E291 Testicular hypofunction: Secondary | ICD-10-CM | POA: Diagnosis not present

## 2022-03-27 DIAGNOSIS — D485 Neoplasm of uncertain behavior of skin: Secondary | ICD-10-CM

## 2022-03-27 DIAGNOSIS — M545 Low back pain, unspecified: Secondary | ICD-10-CM

## 2022-03-27 DIAGNOSIS — Z23 Encounter for immunization: Secondary | ICD-10-CM

## 2022-03-27 DIAGNOSIS — G8929 Other chronic pain: Secondary | ICD-10-CM

## 2022-03-27 DIAGNOSIS — F988 Other specified behavioral and emotional disorders with onset usually occurring in childhood and adolescence: Secondary | ICD-10-CM

## 2022-03-27 DIAGNOSIS — N3281 Overactive bladder: Secondary | ICD-10-CM

## 2022-03-27 DIAGNOSIS — M25551 Pain in right hip: Secondary | ICD-10-CM

## 2022-03-27 MED ORDER — AMPHETAMINE-DEXTROAMPHETAMINE 10 MG PO TABS: 10.0000 mg | ORAL_TABLET | Freq: Two times a day (BID) | ORAL | 0 refills | Status: DC

## 2022-03-27 NOTE — Assessment & Plan Note (Signed)
L second toe Podiatry ref

## 2022-03-27 NOTE — Progress Notes (Signed)
Subjective:  Patient ID: Victor Montes, male    DOB: 1956-01-02  Age: 66 y.o. MRN: [id number]  CC: Follow-up (3 month f/u)   HPI Victor Montes presents for R hip pain, - pt had a shot B. L hip hurts too... F/u hypothyroidism, ADD  Can't do any work due to pain Pt had B hip inj - it helped in July 2023   Outpatient Medications Prior to Visit  Medication Sig Dispense Refill   albuterol (VENTOLIN HFA) 108 (90 Base) MCG/ACT inhaler Inhale 2 puffs into the lungs every 4 (four) hours as needed for wheezing or shortness of breath. 17 g 5   b complex vitamins tablet Take 1 tablet by mouth daily.     cetirizine (ZYRTEC) 10 MG tablet Take 10 mg by mouth daily.     cyclobenzaprine (FLEXERIL) 10 MG tablet Take 1 tablet by mouth three times daily as needed for muscle spasms. 180 tablet 0   guaiFENesin (MUCINEX) 600 MG 12 hr tablet Take 600 mg by mouth daily with breakfast.     KRILL OIL PO Take by mouth.     MAGNESIUM PO Take 1 tablet by mouth daily.     montelukast (SINGULAIR) 10 MG tablet Take 1 tablet (10 mg total) by mouth daily. 90 tablet 3   Multiple Vitamin (MULTIVITAMIN WITH MINERALS) TABS tablet Take 1 tablet by mouth daily.     sertraline (ZOLOFT) 50 MG tablet Take 1 tablet by mouth daily. 90 tablet 0   SYRINGE-NEEDLE, DISP, 3 ML (BD ECLIPSE SYRINGE) 25G X 1" 3 ML MISC USE AS DIRECTED FOR IM INJECTION 50 each 11   tadalafil (CIALIS) 5 MG tablet Take 1 tablet by mouth daily. 90 tablet 1   tamsulosin (FLOMAX) 0.4 MG CAPS capsule Take 1 capsule by mouth daily. 90 capsule 0   testosterone cypionate (DEPOTESTOSTERONE CYPIONATE) 200 MG/ML injection Inject 31m into the muscle every 2 weeks. 12 mL 1   amphetamine-dextroamphetamine (ADDERALL) 10 MG tablet Take 1 tablet (10 mg total) by mouth 2 (two) times daily. 60 tablet 0   nabumetone (RELAFEN) 750 MG tablet Take 1 tablet by mouth twice daily as needed for moderate pain. 180 tablet 0   amphetamine-dextroamphetamine (ADDERALL) 10 MG tablet  Take 1 tablet (10 mg total) by mouth 2 (two) times daily. 60 tablet 0   amphetamine-dextroamphetamine (ADDERALL) 10 MG tablet Take 1 tablet (10 mg total) by mouth 2 (two) times daily. 60 tablet 0   No facility-administered medications prior to visit.    ROS: Review of Systems  Constitutional:  Positive for fatigue. Negative for appetite change and unexpected weight change.  HENT:  Negative for congestion, nosebleeds, sneezing, sore throat and trouble swallowing.   Eyes:  Negative for itching and visual disturbance.  Respiratory:  Negative for cough.   Cardiovascular:  Negative for chest pain, palpitations and leg swelling.  Gastrointestinal:  Negative for abdominal distention, blood in stool, diarrhea and nausea.  Genitourinary:  Negative for frequency and hematuria.  Musculoskeletal:  Positive for arthralgias, back pain and gait problem. Negative for joint swelling and neck pain.  Skin:  Positive for color change. Negative for rash.  Neurological:  Negative for dizziness, tremors, speech difficulty and weakness.  Psychiatric/Behavioral:  Negative for agitation, dysphoric mood and sleep disturbance. The patient is not nervous/anxious.     Objective:  BP (!) 162/80 (BP Location: Left Arm)   Pulse (!) 102   Temp 98.8 F (37.1 C) (Oral)   Ht '6\' 6"'$  (1.981 m)  Wt 246 lb 12.8 oz (111.9 kg)   SpO2 95%   BMI 28.52 kg/m   BP Readings from Last 3 Encounters:  03/27/22 (!) 162/80  01/10/22 (!) 140/82  10/10/21 140/80    Wt Readings from Last 3 Encounters:  03/27/22 246 lb 12.8 oz (111.9 kg)  01/10/22 245 lb 6.4 oz (111.3 kg)  10/10/21 242 lb (109.8 kg)    Physical Exam Constitutional:      General: He is not in acute distress.    Appearance: Normal appearance. He is well-developed.     Comments: NAD  Eyes:     Conjunctiva/sclera: Conjunctivae normal.     Pupils: Pupils are equal, round, and reactive to light.  Neck:     Thyroid: No thyromegaly.     Vascular: No JVD.   Cardiovascular:     Rate and Rhythm: Normal rate and regular rhythm.     Heart sounds: Normal heart sounds. No murmur heard.    No friction rub. No gallop.  Pulmonary:     Effort: Pulmonary effort is normal. No respiratory distress.     Breath sounds: Normal breath sounds. No wheezing or rales.  Chest:     Chest wall: No tenderness.  Abdominal:     General: Bowel sounds are normal. There is no distension.     Palpations: Abdomen is soft. There is no mass.     Tenderness: There is no abdominal tenderness. There is no guarding or rebound.  Musculoskeletal:        General: Tenderness present. Normal range of motion.     Cervical back: Normal range of motion.  Lymphadenopathy:     Cervical: No cervical adenopathy.  Skin:    General: Skin is warm and dry.     Findings: Lesion present. No rash.  Neurological:     Mental Status: He is alert and oriented to person, place, and time.     Cranial Nerves: No cranial nerve deficit.     Motor: No abnormal muscle tone.     Coordination: Coordination normal.     Gait: Gait normal.     Deep Tendon Reflexes: Reflexes are normal and symmetric.  Psychiatric:        Behavior: Behavior normal.        Thought Content: Thought content normal.        Judgment: Judgment normal.   Right cheek raised ulcerated ulcer x2   L 2nd toe growth - Digital Mucous Cysts  Lab Results  Component Value Date   WBC 13.2 (H) 03/27/2022   HGB 17.2 (H) 03/27/2022   HCT 51.1 03/27/2022   PLT 390.0 03/27/2022   GLUCOSE 84 03/27/2022   CHOL 206 (H) 03/27/2022   TRIG 249.0 (H) 03/27/2022   HDL 45.30 03/27/2022   LDLDIRECT 137.0 03/27/2022   LDLCALC 127 (H) 10/05/2021   ALT 30 03/27/2022   AST 20 03/27/2022   NA 140 03/27/2022   K 3.9 03/27/2022   CL 103 03/27/2022   CREATININE 1.10 03/27/2022   BUN 16 03/27/2022   CO2 27 03/27/2022   TSH 0.69 03/27/2022   PSA 2.19 03/27/2022   HGBA1C 5.6 07/11/2017    CT CARDIAC SCORING  Addendum Date: 06/18/2018    ADDENDUM REPORT: 06/18/2018 17:12 CLINICAL DATA:  Risk stratification EXAM: Coronary Calcium Score TECHNIQUE: The patient was scanned on a Siemens Somatom 64 slice scanner. Axial non-contrast 3 mm slices were carried out through the heart. The data set was analyzed on a dedicated work station and  scored using the Agatson method. FINDINGS: Non-cardiac: See separate report from [facility]. Ascending aorta: Normal diameter 3.5 cm Pericardium: Normal Coronary arteries: No calcium noted IMPRESSION: Coronary calcium score of 0. Jenkins Rouge Electronically Signed   By: Jenkins Rouge M.D.   On: 06/18/2018 17:12   Result Date: 06/18/2018 EXAM: OVER-READ INTERPRETATION  CT CHEST The following report is an over-read performed by radiologist Dr. Aletta Edouard of [facility], [place] on 06/18/2018. This over-read does not include interpretation of cardiac or coronary anatomy or pathology. The coronary calcium score interpretation by the cardiologist is attached. COMPARISON:  None. FINDINGS: Vascular: No incidental findings. Mediastinum/Nodes: Visualized mediastinum and hilar regions show no evidence of lymphadenopathy or masses. Lungs/Pleura: Visualized lungs show no evidence of pulmonary edema, consolidation, pneumothorax, nodule or pleural fluid. Upper Abdomen: No acute abnormality. Musculoskeletal: Mild rightward convex scoliosis. Other: Bilateral gynecomastia. IMPRESSION: No significant incidental findings.  Bilateral gynecomastia present. Electronically Signed: By: Aletta Edouard M.D. On: 06/18/2018 17:02    Assessment & Plan:   Problem List Items Addressed This Visit     Hypertension   Relevant Orders   TSH (Completed)   Urinalysis (Completed)   CBC with Differential/Platelet (Completed)   Lipid panel (Completed)   PSA (Completed)   Comprehensive metabolic panel (Completed)   Testosterone (Completed)   Mucous cyst of joint    L second toe Podiatry ref      Relevant Orders    Ambulatory referral to Podiatry   Neoplasm of uncertain behavior of skin    Right cheek raised ulcerated ulcer x2.  Probable basal cell carcinoma.  The patient was referred to skin surgery center      Hypogonadism in male - Primary    Continue with testosterone therapy.  Obtain testosterone, LFTs, CBC, PSA      Relevant Orders   TSH (Completed)   Lipid panel (Completed)   Comprehensive metabolic panel (Completed)   Testosterone (Completed)   Bladder neck obstruction   Relevant Orders   PSA (Completed)   Testosterone (Completed)   Attention deficit disorder    Continue on Adderall.   Potential benefits of a long term stimulants use as well as potential risks  and complications were explained to the patient and were aknowledged.      OAB (overactive bladder)    On Flomax/Cialis daily      Low back pain    Follow-up with pain management.  He may need another injection in the hip.  Obtain lab work      Hip pain    Worse.  Right hip pain, arthritis Follow-up with pain management.  He may need another injection in the hip.  Obtain lab work      Other Visit Diagnoses     Needs flu shot       Relevant Orders   Flu Vaccine QUAD High Dose(Fluad) (Completed)         Meds ordered this encounter  Medications   amphetamine-dextroamphetamine (ADDERALL) 10 MG tablet    Sig: Take 1 tablet (10 mg total) by mouth 2 (two) times daily.    Dispense:  60 tablet    Refill:  0    Please fill on or after 03/24/22   amphetamine-dextroamphetamine (ADDERALL) 10 MG tablet    Sig: Take 1 tablet (10 mg total) by mouth 2 (two) times daily.    Dispense:  60 tablet    Refill:  0    Please fill on or after 04/23/22   amphetamine-dextroamphetamine (ADDERALL)  10 MG tablet    Sig: Take 1 tablet (10 mg total) by mouth 2 (two) times daily.    Dispense:  60 tablet    Refill:  0    Please fill on or after 05/23/22      Follow-up: Return in about 3 months (around 06/27/2022) for a follow-up  visit.  Walker Kehr, MD

## 2022-03-28 LAB — COMPREHENSIVE METABOLIC PANEL
ALT: 30 U/L (ref 0–53)
AST: 20 U/L (ref 0–37)
Albumin: 4.5 g/dL (ref 3.5–5.2)
Alkaline Phosphatase: 64 U/L (ref 39–117)
BUN: 16 mg/dL (ref 6–23)
CO2: 27 mEq/L (ref 19–32)
Calcium: 9.8 mg/dL (ref 8.4–10.5)
Chloride: 103 mEq/L (ref 96–112)
Creatinine, Ser: 1.1 mg/dL (ref 0.40–1.50)
GFR: 68.97 mL/min (ref >60.00)
Glucose, Bld: 84 mg/dL (ref 70–99)
Potassium: 3.9 mEq/L (ref 3.5–5.1)
Sodium: 140 mEq/L (ref 135–145)
Total Bilirubin: 0.7 mg/dL (ref 0.2–1.2)
Total Protein: 6.7 g/dL (ref 6.0–8.3)

## 2022-03-28 LAB — URINALYSIS
Bilirubin Urine: NEGATIVE
Hgb urine dipstick: NEGATIVE
Leukocytes,Ua: NEGATIVE
Nitrite: NEGATIVE
Specific Gravity, Urine: 1.02 (ref 1.000–1.030)
Total Protein, Urine: NEGATIVE
Urine Glucose: 100 — AB
Urobilinogen, UA: 0.2 (ref 0.0–1.0)
pH: 6 (ref 5.0–8.0)

## 2022-03-28 LAB — LIPID PANEL
Cholesterol: 206 mg/dL — ABNORMAL HIGH (ref 0–200)
HDL: 45.3 mg/dL (ref >39.00)
NonHDL: 160.97
Total CHOL/HDL Ratio: 5
Triglycerides: 249 mg/dL — ABNORMAL HIGH (ref 0.0–149.0)
VLDL: 49.8 mg/dL — ABNORMAL HIGH (ref 0.0–40.0)

## 2022-03-28 LAB — CBC WITH DIFFERENTIAL/PLATELET
Basophils Absolute: 0.1 10*3/uL (ref 0.0–0.1)
Basophils Relative: 0.4 % (ref 0.0–3.0)
Eosinophils Absolute: 0 10*3/uL (ref 0.0–0.7)
Eosinophils Relative: 0.3 % (ref 0.0–5.0)
HCT: 51.1 % (ref 39.0–52.0)
Hemoglobin: 17.2 g/dL — ABNORMAL HIGH (ref 13.0–17.0)
Lymphocytes Relative: 12.2 % (ref 12.0–46.0)
Lymphs Abs: 1.6 10*3/uL (ref 0.7–4.0)
MCHC: 33.6 g/dL (ref 30.0–36.0)
MCV: 95.3 fl (ref 78.0–100.0)
Monocytes Absolute: 1.2 10*3/uL — ABNORMAL HIGH (ref 0.1–1.0)
Monocytes Relative: 8.9 % (ref 3.0–12.0)
Neutro Abs: 10.3 10*3/uL — ABNORMAL HIGH (ref 1.4–7.7)
Neutrophils Relative %: 78.2 % — ABNORMAL HIGH (ref 43.0–77.0)
Platelets: 390 10*3/uL (ref 150.0–400.0)
RBC: 5.36 Mil/uL (ref 4.22–5.81)
RDW: 13.7 % (ref 11.5–15.5)
WBC: 13.2 10*3/uL — ABNORMAL HIGH (ref 4.0–10.5)

## 2022-03-28 LAB — TESTOSTERONE: Testosterone: 870.31 ng/dL (ref 300.00–890.00)

## 2022-03-28 LAB — LDL CHOLESTEROL, DIRECT: Direct LDL: 137 mg/dL

## 2022-03-28 LAB — PSA: PSA: 2.19 ng/mL (ref 0.10–4.00)

## 2022-03-28 LAB — TSH: TSH: 0.69 u[IU]/mL (ref 0.35–5.50)

## 2022-03-30 ENCOUNTER — Encounter: Payer: Self-pay | Admitting: Internal Medicine

## 2022-03-31 ENCOUNTER — Other Ambulatory Visit: Payer: Self-pay | Admitting: Internal Medicine

## 2022-03-31 DIAGNOSIS — D485 Neoplasm of uncertain behavior of skin: Secondary | ICD-10-CM

## 2022-04-02 DIAGNOSIS — N320 Bladder-neck obstruction: Secondary | ICD-10-CM | POA: Insufficient documentation

## 2022-04-02 NOTE — Assessment & Plan Note (Signed)
Continue with testosterone therapy.  Obtain testosterone, LFTs, CBC, PSA

## 2022-04-02 NOTE — Assessment & Plan Note (Signed)
Right cheek raised ulcerated ulcer x2.  Probable basal cell carcinoma.  The patient was referred to skin surgery center

## 2022-04-03 ENCOUNTER — Encounter: Payer: Self-pay | Admitting: Internal Medicine

## 2022-04-03 DIAGNOSIS — M25559 Pain in unspecified hip: Secondary | ICD-10-CM | POA: Insufficient documentation

## 2022-04-03 NOTE — Assessment & Plan Note (Signed)
Continue on Adderall.   Potential benefits of a long term stimulants use as well as potential risks  and complications were explained to the patient and were aknowledged.

## 2022-04-03 NOTE — Assessment & Plan Note (Signed)
Worse.  Right hip pain, arthritis Follow-up with pain management.  He may need another injection in the hip.  Obtain lab work

## 2022-04-03 NOTE — Assessment & Plan Note (Addendum)
Follow-up with pain management.  He may need another injection in the hip.  Obtain lab work

## 2022-04-03 NOTE — Assessment & Plan Note (Signed)
On Flomax/Cialis daily

## 2022-04-06 ENCOUNTER — Telehealth: Payer: Self-pay | Admitting: Podiatry

## 2022-04-06 ENCOUNTER — Ambulatory Visit (INDEPENDENT_AMBULATORY_CARE_PROVIDER_SITE_OTHER): Payer: Medicare Other | Admitting: Podiatry

## 2022-04-06 ENCOUNTER — Ambulatory Visit (INDEPENDENT_AMBULATORY_CARE_PROVIDER_SITE_OTHER): Payer: Medicare Other

## 2022-04-06 DIAGNOSIS — M7989 Other specified soft tissue disorders: Secondary | ICD-10-CM

## 2022-04-06 DIAGNOSIS — M2042 Other hammer toe(s) (acquired), left foot: Secondary | ICD-10-CM | POA: Diagnosis not present

## 2022-04-06 DIAGNOSIS — M79672 Pain in left foot: Secondary | ICD-10-CM | POA: Diagnosis not present

## 2022-04-06 NOTE — Progress Notes (Signed)
.Subjective:   Patient ID: Victor Montes, male   DOB: 66 y.o.   MRN: [id number]   HPI Chief Complaint  Patient presents with   Cyst    Patient came in today for a cyst on the left 2nd toe, started 1 year ago, patient denies any pain, patient states he has cut it off and another doctor has froze it, X-Rays taken today   66 year old male presents the office with above concerns.  He states that he had a cyst on the left second toe which started a year ago.  States that it was frozen off previously but is remained about the same.  Sure.  No drainage or pus today reports.  The lesion is tender with pressure.   Review of Systems  All other systems reviewed and are negative.  Past Medical History:  Diagnosis Date   ADD (attention deficit disorder)    Allergic rhinitis    Allergy    Asthma    Cancer ([place])    basal cell skin cancer    Cervical radiculopathy 2007   Right- Dr. Hal Neer    Constipation 2011   Glucose intolerance (impaired glucose tolerance)    Heart murmur    Hyperlipidemia    Hypogonadism male    Occipital neuralgia 2011   Left   OSA (obstructive sleep apnea)    Sleep apnea    no cpap currently     Past Surgical History:  Procedure Laterality Date   COLONOSCOPY     POLYPECTOMY     UVULOPALATOPLASTY     s/p     Current Outpatient Medications:    albuterol (VENTOLIN HFA) 108 (90 Base) MCG/ACT inhaler, Inhale 2 puffs into the lungs every 4 (four) hours as needed for wheezing or shortness of breath., Disp: 17 g, Rfl: 5   amphetamine-dextroamphetamine (ADDERALL) 10 MG tablet, Take 1 tablet (10 mg total) by mouth 2 (two) times daily., Disp: 60 tablet, Rfl: 0   amphetamine-dextroamphetamine (ADDERALL) 10 MG tablet, Take 1 tablet (10 mg total) by mouth 2 (two) times daily., Disp: 60 tablet, Rfl: 0   amphetamine-dextroamphetamine (ADDERALL) 10 MG tablet, Take 1 tablet (10 mg total) by mouth 2 (two) times daily., Disp: 60 tablet, Rfl: 0   b complex vitamins tablet, Take 1  tablet by mouth daily., Disp: , Rfl:    cetirizine (ZYRTEC) 10 MG tablet, Take 10 mg by mouth daily., Disp: , Rfl:    cyclobenzaprine (FLEXERIL) 10 MG tablet, Take 1 tablet by mouth three times daily as needed for muscle spasms., Disp: 180 tablet, Rfl: 0   guaiFENesin (MUCINEX) 600 MG 12 hr tablet, Take 600 mg by mouth daily with breakfast., Disp: , Rfl:    KRILL OIL PO, Take by mouth., Disp: , Rfl:    MAGNESIUM PO, Take 1 tablet by mouth daily., Disp: , Rfl:    montelukast (SINGULAIR) 10 MG tablet, Take 1 tablet (10 mg total) by mouth daily., Disp: 90 tablet, Rfl: 3   Multiple Vitamin (MULTIVITAMIN WITH MINERALS) TABS tablet, Take 1 tablet by mouth daily., Disp: , Rfl:    nabumetone (RELAFEN) 750 MG tablet, Take 1 tablet by mouth twice daily as needed for moderate pain., Disp: 180 tablet, Rfl: 0   sertraline (ZOLOFT) 50 MG tablet, Take 1 tablet by mouth daily., Disp: 90 tablet, Rfl: 0   SYRINGE-NEEDLE, DISP, 3 ML (BD ECLIPSE SYRINGE) 25G X 1" 3 ML MISC, USE AS DIRECTED FOR IM INJECTION, Disp: 50 each, Rfl: 11   tadalafil (CIALIS)  5 MG tablet, Take 1 tablet by mouth daily., Disp: 90 tablet, Rfl: 1   tamsulosin (FLOMAX) 0.4 MG CAPS capsule, Take 1 capsule by mouth daily., Disp: 90 capsule, Rfl: 0   testosterone cypionate (DEPOTESTOSTERONE CYPIONATE) 200 MG/ML injection, Inject 65m into the muscle every 2 weeks., Disp: 12 mL, Rfl: 1  No Known Allergies         Objective:  Physical Exam  General: AAO x3, NAD  Dermatological: Skin is warm, dry and supple bilateral. There are no open sores, no preulcerative lesions, no rash or signs of infection present.  Vascular: Dorsalis Pedis artery and Posterior Tibial artery pedal pulses are 2/4 bilateral with immedate capillary fill time. There is no pain with calf compression, swelling, warmth, erythema.   Neruologic: Grossly intact via light touch bilateral.   Musculoskeletal: On the dorsal aspect of the right second toe is a firm mobile soft  tissue mass.  Appears to be a mucoid cyst however no fluid is noted today.  Hammertoe deformity is noted also.  Gait: Unassisted, Nonantalgic.       Assessment:   Soft tissue mass, hammertoe deformity left second toe     Plan:  -Treatment options discussed including all alternatives, risks, and complications -Etiology of symptoms were discussed -X-rays were obtained and reviewed with the patient.  3 views of the left foot were obtained.  No subacute fracture or soft tissue calcifications noted.  Hammertoes present. -We discussed with conservative as well as surgical treatment options.  After discussing to the optionsto surgical excision.  Discussed with him soft tissue mass excision, arthroplasty of the DIPJ.  He wants to proceed with this.  Discussed doing this in the office versus the surgical centerunder local anesthesia only in the office. -The incision placement as well as the postoperative course was discussed with the patient. I discussed risks of the surgery which include, but not limited to, infection, bleeding, pain, swelling, need for further surgery, delayed or nonhealing, painful or ugly scar, numbness or sensation changes, over/under correction, recurrence, transfer lesions, further deformity, hardware failure, DVT/PE, loss of toe/foot. Patient understands these risks and wishes to proceed with surgery. The surgical consent was reviewed with the patient all 3 pages were signed. No promises or guarantees were given to the outcome of the procedure. All questions were answered to the best of my ability. Before the surgery the patient was encouraged to call the office if there is any further questions. The surgery will be performed in the office on an outpatient basis.    MTrula SladeDPM

## 2022-04-06 NOTE — Telephone Encounter (Signed)
DOS: 04/19/2022 (Office Surgery)  Medicare [employer] Senior Supplement  Exc. Benign Lesion 1.0 cm Lt ([id number]) Hammertoe Repair 2nd Lt ([id number])  DX: M20.42  Patients insurances do not require prior authorization for surgery.

## 2022-04-12 ENCOUNTER — Ambulatory Visit: Payer: Medicare Other | Admitting: Internal Medicine

## 2022-04-17 ENCOUNTER — Other Ambulatory Visit: Payer: Self-pay | Admitting: Podiatry

## 2022-04-17 ENCOUNTER — Ambulatory Visit (INDEPENDENT_AMBULATORY_CARE_PROVIDER_SITE_OTHER): Payer: Medicare Other | Admitting: Podiatry

## 2022-04-17 ENCOUNTER — Ambulatory Visit (INDEPENDENT_AMBULATORY_CARE_PROVIDER_SITE_OTHER): Payer: Medicare Other

## 2022-04-17 VITALS — BP 170/84 | HR 84 | Temp 99.0°F

## 2022-04-17 DIAGNOSIS — M79672 Pain in left foot: Secondary | ICD-10-CM

## 2022-04-17 DIAGNOSIS — D2371 Other benign neoplasm of skin of right lower limb, including hip: Secondary | ICD-10-CM | POA: Diagnosis not present

## 2022-04-17 DIAGNOSIS — M2042 Other hammer toe(s) (acquired), left foot: Secondary | ICD-10-CM

## 2022-04-17 DIAGNOSIS — M7989 Other specified soft tissue disorders: Secondary | ICD-10-CM | POA: Diagnosis not present

## 2022-04-17 MED ORDER — CEPHALEXIN 500 MG PO CAPS: 500.0000 mg | ORAL_CAPSULE | Freq: Three times a day (TID) | ORAL | 0 refills | Status: DC

## 2022-04-17 NOTE — Progress Notes (Addendum)
Subjective:  66 year old male presents the office today for surgical excision soft tissue mass, arthroplasty of the toe given the painful lesion.  He has no new concerns today.  Objective: AAO x3, NAD DP/PT pulses palpable bilaterally, CRT less than 3 seconds Exam is unchanged. There is a soft tissue mass noted along the left second DIPJ but digital contracture present.  Tenderness palpation of the skin with No pain with calf compression, swelling, warmth, erythema  Assessment: Skin lesion, digital deformity left second toe  Plan: -All treatment options discussed with the patient including all alternatives, risks, complications.  -Patient x-rays were performed.  3 views left foot were obtained.  Status post arthroplasty of the DIPJ.  Hammertoe present.  No evidence of acute fracture -We again discussed both conservative as well as surgical treatment options.  At this time he was proceed with surgical excision in the above procedure.  Alternatives risks and complications discussed.  Consent was signed previously but we again reviewed the surgery as well as postoperative course today.  Skin was cleaned with alcohol and mixture of 3 cc of lidocaine, Marcaine plain was infiltrated in a digital block fashion.  Once anesthetized he was brought to the procedure suite with the left lower extremities and scrubbed, prepped, draped in normal sterile fashion.  Tourniquet was applied.  Timeout was performed.  The left lower extremity exsanguinated and the pneumatic ankle tourniquet inflated to 250 mmHg.  At this time to semielliptical incisions were planned along the DIPJ on the area soft tissue mass with a 15 blade scalpel through the epidermis and the dermis removing a wedge of tissue. Mass was 0.5cm.  At this time the extensor tendon was then transected and reflected proximally.  Sagittal saw was utilized to resect the head of the middle phalanx.  Smoothed with a rasp.  Irrigated with saline, Betadine mixture.   Extensor tendon reapproximated with Monocryl and skin was then closed with nylon.  Betadine was placed over the incision followed by dry sterile dressing.  Tourniquet was released and was found to be in immediate capillary fill time of the digit.  He tolerated procedure well any complications. -Keflex -For pain he wants to do Tylenol, ibuprofen combo and he also has Flexeril at home.  Trula Slade DPM

## 2022-04-17 NOTE — Addendum Note (Signed)
Addended by: Geni Bers on: 04/17/2022 04:02 PM   Modules accepted: Orders

## 2022-04-19 ENCOUNTER — Other Ambulatory Visit: Payer: Self-pay | Admitting: Internal Medicine

## 2022-04-24 ENCOUNTER — Ambulatory Visit (INDEPENDENT_AMBULATORY_CARE_PROVIDER_SITE_OTHER): Payer: Medicare Other

## 2022-04-24 ENCOUNTER — Ambulatory Visit (INDEPENDENT_AMBULATORY_CARE_PROVIDER_SITE_OTHER): Payer: Medicare Other | Admitting: Podiatry

## 2022-04-24 DIAGNOSIS — M2042 Other hammer toe(s) (acquired), left foot: Secondary | ICD-10-CM

## 2022-04-24 NOTE — Progress Notes (Unsigned)
Patient presents today for post op visit # 1, patient of Dr. Jacqualyn Posey.   POV # 1 DOS 04/17/2022 REMOVAL OF SOFT TISSUE MASS & DISTAL IPJ ARTHROPLASTY LT 2ND TOE    He presents in his surgical shoe. Denies any falls or injury to the foot. Foot is swollen. No signs of infection. No calf pain or shortness of breath. Bandages dry and intact. Incision is intact. He denies any pain at this time.     Xrays taken today and reviewed by Dr. Jacqualyn Posey. He did take a look at her foot today as well.   Foot redressed today and placed him back in the surgical shoe. Reviewed icing and elevation. He will follow up with Dr. Jacqualyn Posey next week for POV# 2 suture removal.  --- -X-rays were obtained and reviewed.  3 views of the foot were obtained.  Status post arthroplasty of the second digit.  Hammertoe present.  No evidence of acute fracture complicating factors. -I independently evaluated the patient.  Status post arthroplasty second toe with excision of skin lesion which is healing well.  Sutures are intact there is no edema, erythema or signs of infection.  He has no pain.  I recommend dressing was applied.  He can keep the dressing clean, dry, intact and remain in surgical shoe until follow-up.  Plan for suture removal next appointment.  Monitor for signs or symptoms of infection.  Trula Slade DPM

## 2022-04-25 ENCOUNTER — Encounter: Payer: Self-pay | Admitting: Podiatry

## 2022-05-02 ENCOUNTER — Ambulatory Visit: Payer: Medicare Other

## 2022-05-07 ENCOUNTER — Other Ambulatory Visit: Payer: Self-pay | Admitting: Internal Medicine

## 2022-05-08 ENCOUNTER — Encounter: Payer: Self-pay | Admitting: Internal Medicine

## 2022-05-09 MED ORDER — TESTOSTERONE CYPIONATE 200 MG/ML IM SOLN: INTRAMUSCULAR | 1 refills | Status: DC

## 2022-05-11 ENCOUNTER — Encounter: Payer: Medicare Other | Admitting: Podiatry

## 2022-05-18 ENCOUNTER — Other Ambulatory Visit: Payer: Self-pay | Admitting: Internal Medicine

## 2022-06-09 ENCOUNTER — Other Ambulatory Visit: Payer: Self-pay | Admitting: Internal Medicine

## 2022-06-24 ENCOUNTER — Other Ambulatory Visit: Payer: Self-pay | Admitting: Internal Medicine

## 2022-06-27 ENCOUNTER — Encounter: Payer: Self-pay | Admitting: Internal Medicine

## 2022-06-27 ENCOUNTER — Ambulatory Visit (INDEPENDENT_AMBULATORY_CARE_PROVIDER_SITE_OTHER): Payer: Medicare Other | Admitting: Internal Medicine

## 2022-06-27 VITALS — BP 140/90 | HR 85 | Temp 98.2°F | Ht 78.0 in | Wt 238.0 lb

## 2022-06-27 DIAGNOSIS — E291 Testicular hypofunction: Secondary | ICD-10-CM

## 2022-06-27 DIAGNOSIS — M545 Low back pain, unspecified: Secondary | ICD-10-CM

## 2022-06-27 DIAGNOSIS — Z23 Encounter for immunization: Secondary | ICD-10-CM

## 2022-06-27 DIAGNOSIS — Z86010 Personal history of colonic polyps: Secondary | ICD-10-CM

## 2022-06-27 DIAGNOSIS — N486 Induration penis plastica: Secondary | ICD-10-CM

## 2022-06-27 DIAGNOSIS — F330 Major depressive disorder, recurrent, mild: Secondary | ICD-10-CM

## 2022-06-27 DIAGNOSIS — N3281 Overactive bladder: Secondary | ICD-10-CM

## 2022-06-27 DIAGNOSIS — M25551 Pain in right hip: Secondary | ICD-10-CM | POA: Diagnosis not present

## 2022-06-27 DIAGNOSIS — F988 Other specified behavioral and emotional disorders with onset usually occurring in childhood and adolescence: Secondary | ICD-10-CM

## 2022-06-27 DIAGNOSIS — G8929 Other chronic pain: Secondary | ICD-10-CM

## 2022-06-27 MED ORDER — TESTOSTERONE CYPIONATE 200 MG/ML IM SOLN: INTRAMUSCULAR | 1 refills | Status: DC

## 2022-06-27 MED ORDER — AMPHETAMINE-DEXTROAMPHETAMINE 10 MG PO TABS: 10.0000 mg | ORAL_TABLET | Freq: Two times a day (BID) | ORAL | 0 refills | Status: DC

## 2022-06-27 NOTE — Assessment & Plan Note (Signed)
Stable Will ref to see Dr Junious Silk

## 2022-06-27 NOTE — Assessment & Plan Note (Signed)
Continue on Adderall.   Potential benefits of a long term stimulants use as well as potential risks  and complications were explained to the patient and were aknowledged.

## 2022-06-27 NOTE — Assessment & Plan Note (Signed)
Cont on Flexeril, Nabumetone Rx prn

## 2022-06-27 NOTE — Addendum Note (Signed)
Addended by: Marijean Heath R on: 06/27/2022 02:14 PM   Modules accepted: Orders

## 2022-06-27 NOTE — Assessment & Plan Note (Addendum)
Worse Will ref to see Dr Junious Silk

## 2022-06-27 NOTE — Assessment & Plan Note (Signed)
On testosterone Rx  Potential benefits of a long term testosterone use as well as potential risks  and complications were explained to the patient and were aknowledged.

## 2022-06-27 NOTE — Progress Notes (Signed)
Subjective:  Patient ID: Victor Montes, male    DOB: [birth year]  Age: 67 y.o. MRN: [id number]  CC: No chief complaint on file.   HPI Victor Montes presents for ADD, LBP, R hip pain, polyps C/o penile curvature - worse   Outpatient Medications Prior to Visit  Medication Sig Dispense Refill   albuterol (VENTOLIN HFA) 108 (90 Base) MCG/ACT inhaler Inhale 2 puffs into the lungs every 4 (four) hours as needed for wheezing or shortness of breath. 17 g 5   b complex vitamins tablet Take 1 tablet by mouth daily.     cetirizine (ZYRTEC) 10 MG tablet Take 10 mg by mouth daily.     cyclobenzaprine (FLEXERIL) 10 MG tablet Take 1 tablet by mouth three times daily as needed for muscle spasms. 180 tablet 0   guaiFENesin (MUCINEX) 600 MG 12 hr tablet Take 600 mg by mouth daily with breakfast.     KRILL OIL PO Take by mouth.     MAGNESIUM PO Take 1 tablet by mouth daily.     montelukast (SINGULAIR) 10 MG tablet Take 1 tablet by mouth daily. 90 tablet 3   Multiple Vitamin (MULTIVITAMIN WITH MINERALS) TABS tablet Take 1 tablet by mouth daily.     nabumetone (RELAFEN) 750 MG tablet Take 1 tablet by mouth twice daily as needed for moderate pain. 180 tablet 0   sertraline (ZOLOFT) 50 MG tablet Take 1 tablet by mouth daily. 90 tablet 1   SYRINGE-NEEDLE, DISP, 3 ML (BD ECLIPSE SYRINGE) 25G X 1" 3 ML MISC USE AS DIRECTED FOR IM INJECTION 50 each 11   tadalafil (CIALIS) 5 MG tablet Take 1 tablet by mouth daily. 90 tablet 3   tamsulosin (FLOMAX) 0.4 MG CAPS capsule Take 1 capsule by mouth daily. 90 capsule 1   amphetamine-dextroamphetamine (ADDERALL) 10 MG tablet Take 1 tablet (10 mg total) by mouth 2 (two) times daily. 60 tablet 0   cephALEXin (KEFLEX) 500 MG capsule Take 1 capsule (500 mg total) by mouth 3 (three) times daily. 21 capsule 0   testosterone cypionate (DEPOTESTOSTERONE CYPIONATE) 200 MG/ML injection Inject 69m into the muscle every 2 weeks. 12 mL 1   amphetamine-dextroamphetamine (ADDERALL) 10 MG  tablet Take 1 tablet (10 mg total) by mouth 2 (two) times daily. 60 tablet 0   amphetamine-dextroamphetamine (ADDERALL) 10 MG tablet Take 1 tablet (10 mg total) by mouth 2 (two) times daily. 60 tablet 0   No facility-administered medications prior to visit.    ROS: Review of Systems  Constitutional:  Positive for fatigue. Negative for appetite change and unexpected weight change.  HENT:  Negative for congestion, nosebleeds, sneezing, sore throat and trouble swallowing.   Eyes:  Negative for itching and visual disturbance.  Respiratory:  Negative for cough.   Cardiovascular:  Negative for chest pain, palpitations and leg swelling.  Gastrointestinal:  Negative for abdominal distention, blood in stool, diarrhea and nausea.  Genitourinary:  Negative for frequency and hematuria.  Musculoskeletal:  Negative for back pain, gait problem, joint swelling and neck pain.  Skin:  Negative for rash.  Neurological:  Negative for dizziness, tremors, speech difficulty and weakness.  Psychiatric/Behavioral:  Negative for agitation, dysphoric mood and sleep disturbance. The patient is not nervous/anxious.     Objective:  BP (!) 140/90 (BP Location: Left Arm, Patient Position: Sitting)   Pulse 85   Temp 98.2 F (36.8 C) (Oral)   Ht '6\' 6"'$  (1.981 m)   Wt 238 lb (108 kg)   SpO2  97%   BMI 27.50 kg/m   BP Readings from Last 3 Encounters:  06/27/22 (!) 140/90  04/17/22 (!) 170/84  03/27/22 (!) 162/80    Wt Readings from Last 3 Encounters:  06/27/22 238 lb (108 kg)  03/27/22 246 lb 12.8 oz (111.9 kg)  01/10/22 245 lb 6.4 oz (111.3 kg)    Physical Exam Constitutional:      General: He is not in acute distress.    Appearance: Normal appearance. He is well-developed.     Comments: NAD  Eyes:     Conjunctiva/sclera: Conjunctivae normal.     Pupils: Pupils are equal, round, and reactive to light.  Neck:     Thyroid: No thyromegaly.     Vascular: No JVD.  Cardiovascular:     Rate and Rhythm:  Normal rate and regular rhythm.     Heart sounds: Normal heart sounds. No murmur heard.    No friction rub. No gallop.  Pulmonary:     Effort: Pulmonary effort is normal. No respiratory distress.     Breath sounds: Normal breath sounds. No wheezing or rales.  Chest:     Chest wall: No tenderness.  Abdominal:     General: Bowel sounds are normal. There is no distension.     Palpations: Abdomen is soft. There is no mass.     Tenderness: There is no abdominal tenderness. There is no guarding or rebound.  Musculoskeletal:        General: No tenderness. Normal range of motion.     Cervical back: Normal range of motion.  Lymphadenopathy:     Cervical: No cervical adenopathy.  Skin:    General: Skin is warm and dry.     Findings: No rash.  Neurological:     Mental Status: He is alert and oriented to person, place, and time.     Cranial Nerves: No cranial nerve deficit.     Motor: No abnormal muscle tone.     Coordination: Coordination normal.     Gait: Gait normal.     Deep Tendon Reflexes: Reflexes are normal and symmetric.  Psychiatric:        Behavior: Behavior normal.        Thought Content: Thought content normal.        Judgment: Judgment normal.   R ip w/full ROM  Lab Results  Component Value Date   WBC 13.2 (H) 03/27/2022   HGB 17.2 (H) 03/27/2022   HCT 51.1 03/27/2022   PLT 390.0 03/27/2022   GLUCOSE 84 03/27/2022   CHOL 206 (H) 03/27/2022   TRIG 249.0 (H) 03/27/2022   HDL 45.30 03/27/2022   LDLDIRECT 137.0 03/27/2022   LDLCALC 127 (H) 10/05/2021   ALT 30 03/27/2022   AST 20 03/27/2022   NA 140 03/27/2022   K 3.9 03/27/2022   CL 103 03/27/2022   CREATININE 1.10 03/27/2022   BUN 16 03/27/2022   CO2 27 03/27/2022   TSH 0.69 03/27/2022   PSA 2.19 03/27/2022   HGBA1C 5.6 07/11/2017    CT CARDIAC SCORING  Addendum Date: 06/18/2018   ADDENDUM REPORT: 06/18/2018 17:12 CLINICAL DATA:  Risk stratification EXAM: Coronary Calcium Score TECHNIQUE: The patient was  scanned on a Siemens Somatom 64 slice scanner. Axial non-contrast 3 mm slices were carried out through the heart. The data set was analyzed on a dedicated work station and scored using the [place]. FINDINGS: Non-cardiac: See separate report from [facility]. Ascending aorta: Normal diameter 3.5 cm Pericardium: Normal Coronary arteries:  No calcium noted IMPRESSION: Coronary calcium score of 0. Jenkins Rouge Electronically Signed   By: Jenkins Rouge M.D.   On: 06/18/2018 17:12   Result Date: 06/18/2018 EXAM: OVER-READ INTERPRETATION  CT CHEST The following report is an over-read performed by radiologist Dr. Aletta Edouard of [facility], [place] on 06/18/2018. This over-read does not include interpretation of cardiac or coronary anatomy or pathology. The coronary calcium score interpretation by the cardiologist is attached. COMPARISON:  None. FINDINGS: Vascular: No incidental findings. Mediastinum/Nodes: Visualized mediastinum and hilar regions show no evidence of lymphadenopathy or masses. Lungs/Pleura: Visualized lungs show no evidence of pulmonary edema, consolidation, pneumothorax, nodule or pleural fluid. Upper Abdomen: No acute abnormality. Musculoskeletal: Mild rightward convex scoliosis. Other: Bilateral gynecomastia. IMPRESSION: No significant incidental findings.  Bilateral gynecomastia present. Electronically Signed: By: Aletta Edouard M.D. On: 06/18/2018 17:02    Assessment & Plan:   Problem List Items Addressed This Visit       Endocrine   Hypogonadism in male    On testosterone Rx  Potential benefits of a long term testosterone use as well as potential risks  and complications were explained to the patient and were aknowledged.        Musculoskeletal and Integument   Peyronie's disease    Worse Will ref to see Dr Junious Silk      Relevant Orders   Ambulatory referral to Urology     Genitourinary   OAB (overactive bladder)     Stable Will ref to see Dr  Junious Silk      Relevant Orders   Ambulatory referral to Urology     Other   Low back pain    Cont on Flexeril, Nabumetone Rx prn      History of colon polyps    Last colon 01/2020 - due in 2024      Relevant Orders   Ambulatory referral to Gastroenterology   Hip pain    Right hip pain, arthritis Planning to have another steroid shot      Depression    Continue on Zoloft      Attention deficit disorder - Primary    Continue on Adderall.   Potential benefits of a long term stimulants use as well as potential risks  and complications were explained to the patient and were aknowledged.         Meds ordered this encounter  Medications   amphetamine-dextroamphetamine (ADDERALL) 10 MG tablet    Sig: Take 1 tablet (10 mg total) by mouth 2 (two) times daily.    Dispense:  60 tablet    Refill:  0    Please fill on or after 09/24/22   amphetamine-dextroamphetamine (ADDERALL) 10 MG tablet    Sig: Take 1 tablet (10 mg total) by mouth 2 (two) times daily.    Dispense:  60 tablet    Refill:  0    Please fill on or after 07/26/22   amphetamine-dextroamphetamine (ADDERALL) 10 MG tablet    Sig: Take 1 tablet (10 mg total) by mouth 2 (two) times daily.    Dispense:  60 tablet    Refill:  0    Please fill on or after 08/25/22   testosterone cypionate (DEPOTESTOSTERONE CYPIONATE) 200 MG/ML injection    Sig: Inject 22m into the muscle every 2 weeks.    Dispense:  12 mL    Refill:  1      Follow-up: Return in about 3 months (around 09/25/2022) for a follow-up visit.  AWalker Kehr MD

## 2022-06-27 NOTE — Assessment & Plan Note (Signed)
Last colon 01/2020 - due in 2024

## 2022-06-27 NOTE — Assessment & Plan Note (Signed)
Continue on Zoloft

## 2022-06-27 NOTE — Assessment & Plan Note (Addendum)
Right hip pain, arthritis Planning to have another steroid shot

## 2022-06-29 ENCOUNTER — Other Ambulatory Visit: Payer: Self-pay | Admitting: Internal Medicine

## 2022-06-29 ENCOUNTER — Telehealth: Payer: Self-pay | Admitting: *Deleted

## 2022-06-29 MED ORDER — TESTOSTERONE CYPIONATE 200 MG/ML IM SOLN: INTRAMUSCULAR | 1 refills | Status: DC

## 2022-06-29 MED ORDER — NABUMETONE 750 MG PO TABS: ORAL_TABLET | ORAL | 0 refills | Status: DC

## 2022-06-29 NOTE — Telephone Encounter (Signed)
Rec' fax pt needing refill on Nabumetone. Sent rx to [facility].Marland KitchenJohny Chess

## 2022-07-29 ENCOUNTER — Other Ambulatory Visit: Payer: Self-pay | Admitting: Internal Medicine

## 2022-08-05 IMAGING — DX DG LUMBAR SPINE 2-3V
3 series · 3 of 3 positions shown · non-contrast
Comparison: None.

CLINICAL DATA: Low back pain worsening for 6 months

EXAM:
LUMBAR SPINE - 2-3 VIEW

[l-spine ap]
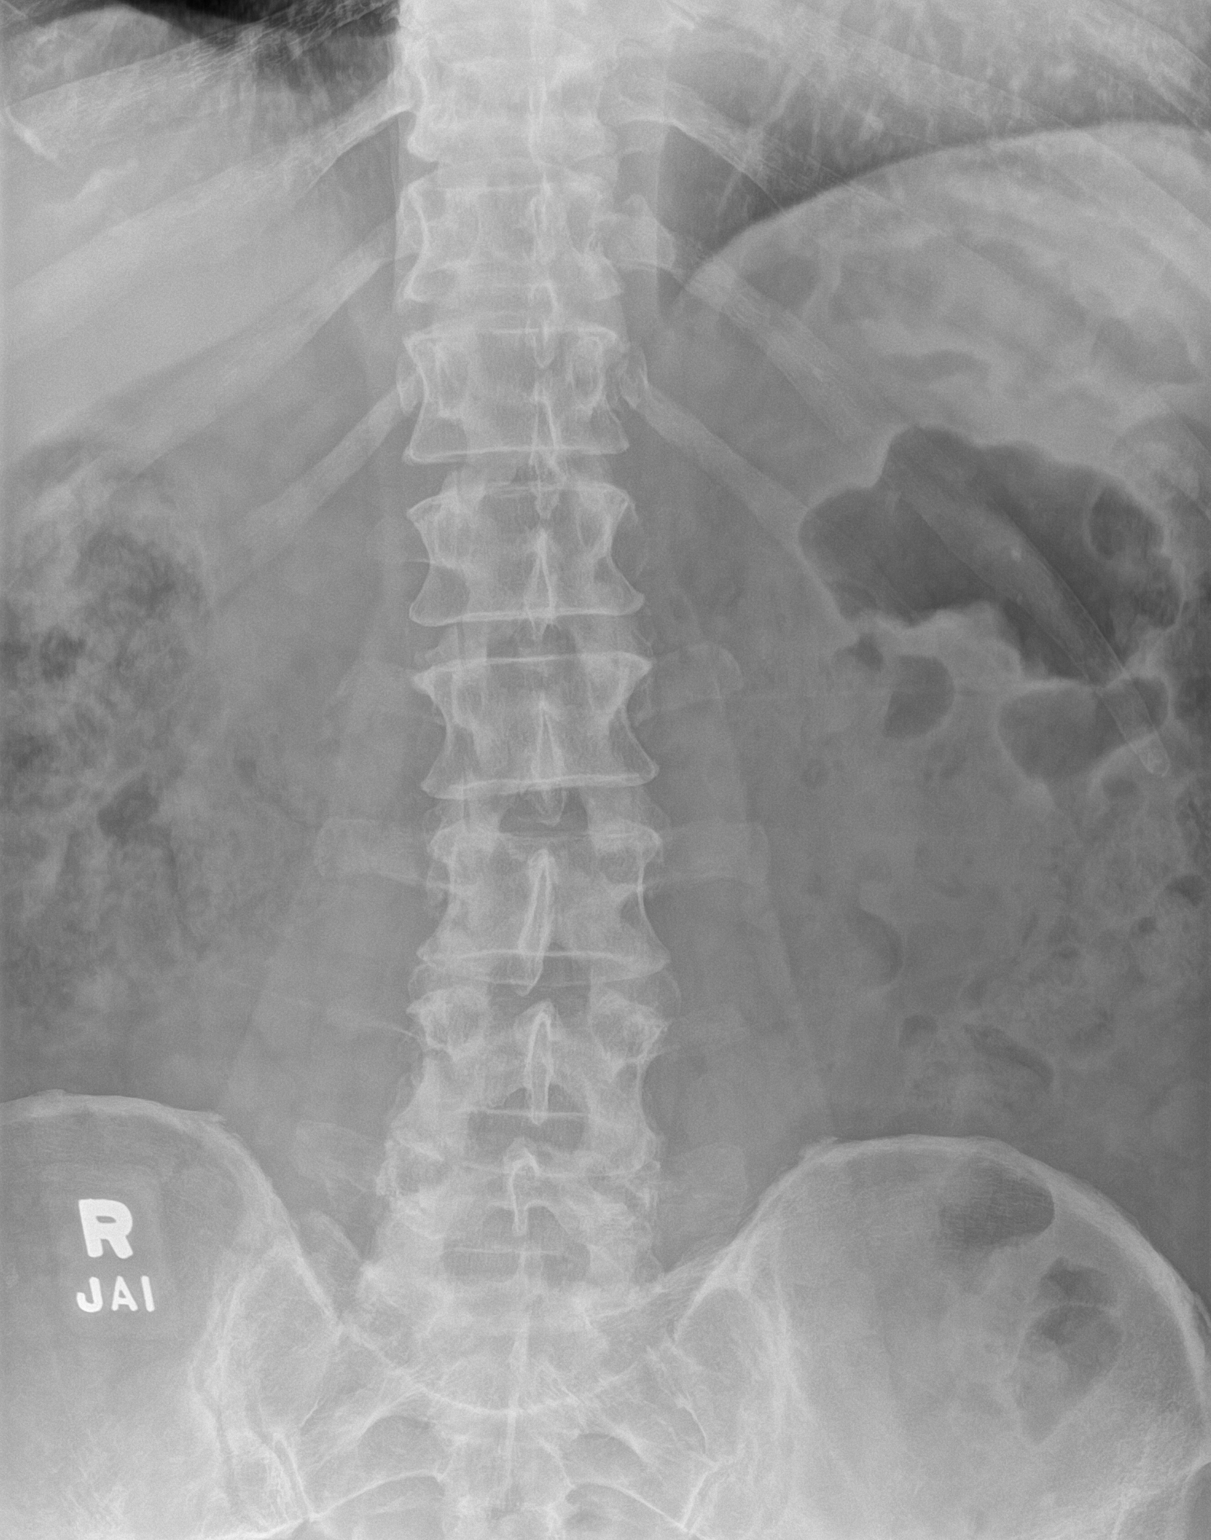

[l-spine lateral (1 of 2)]
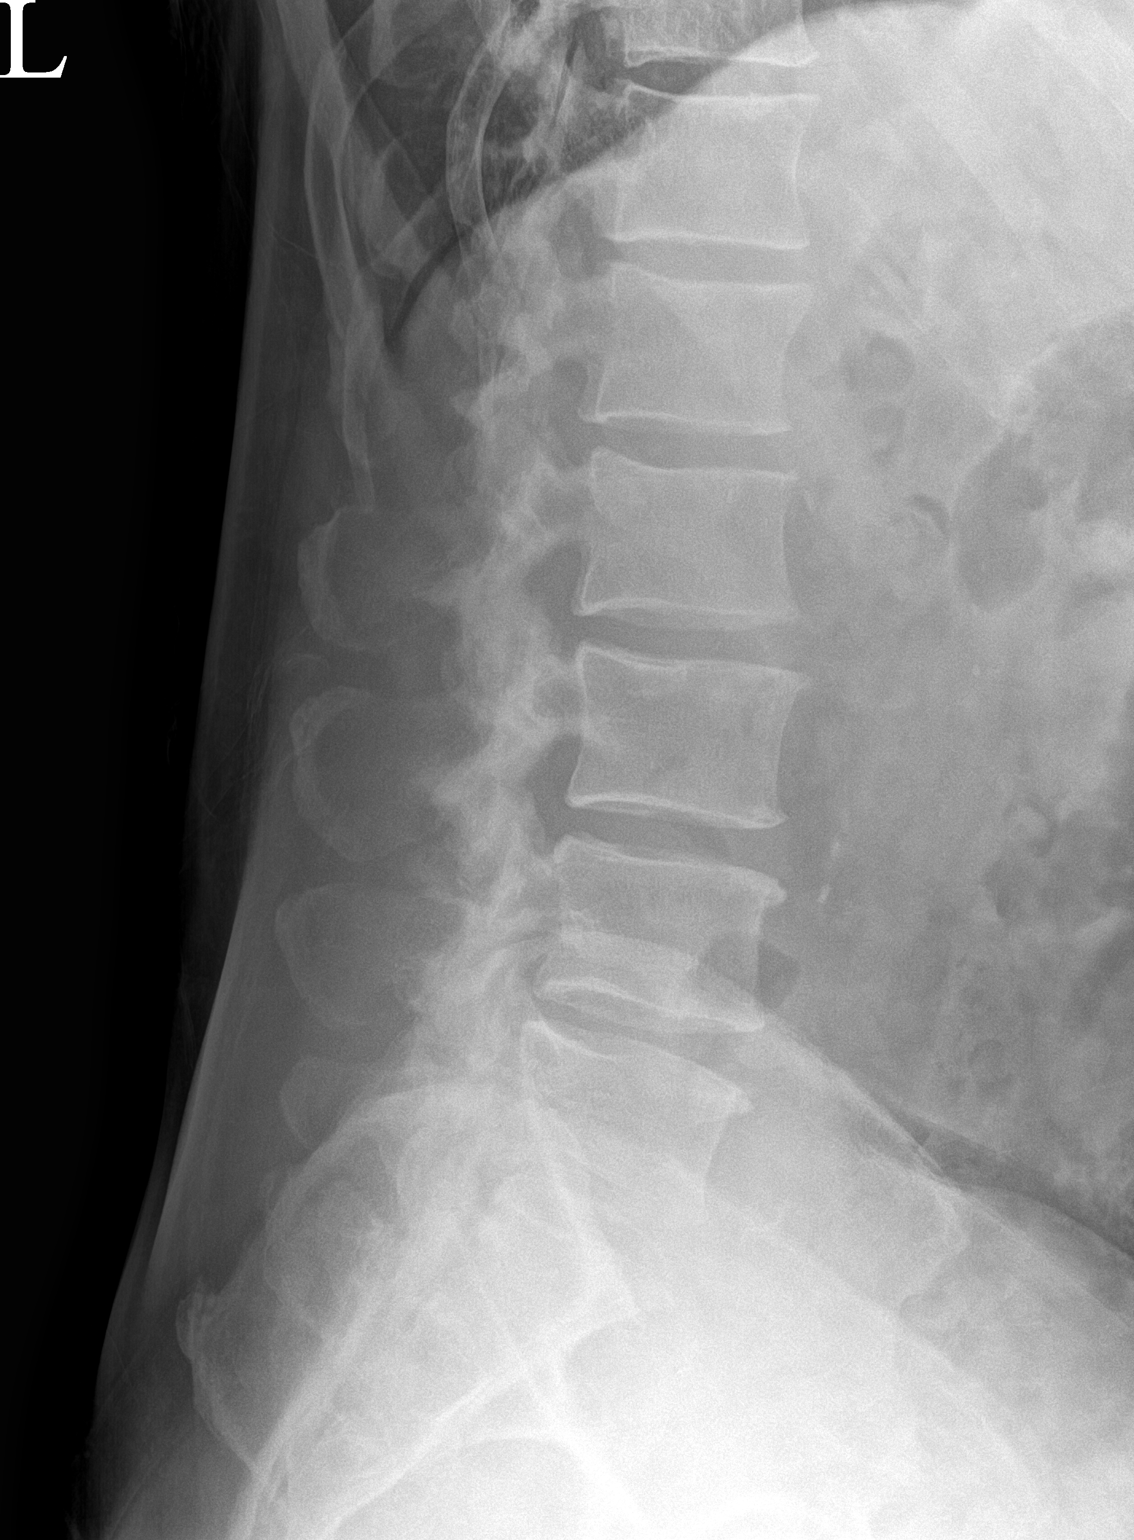

[l-spine lateral (2 of 2)]
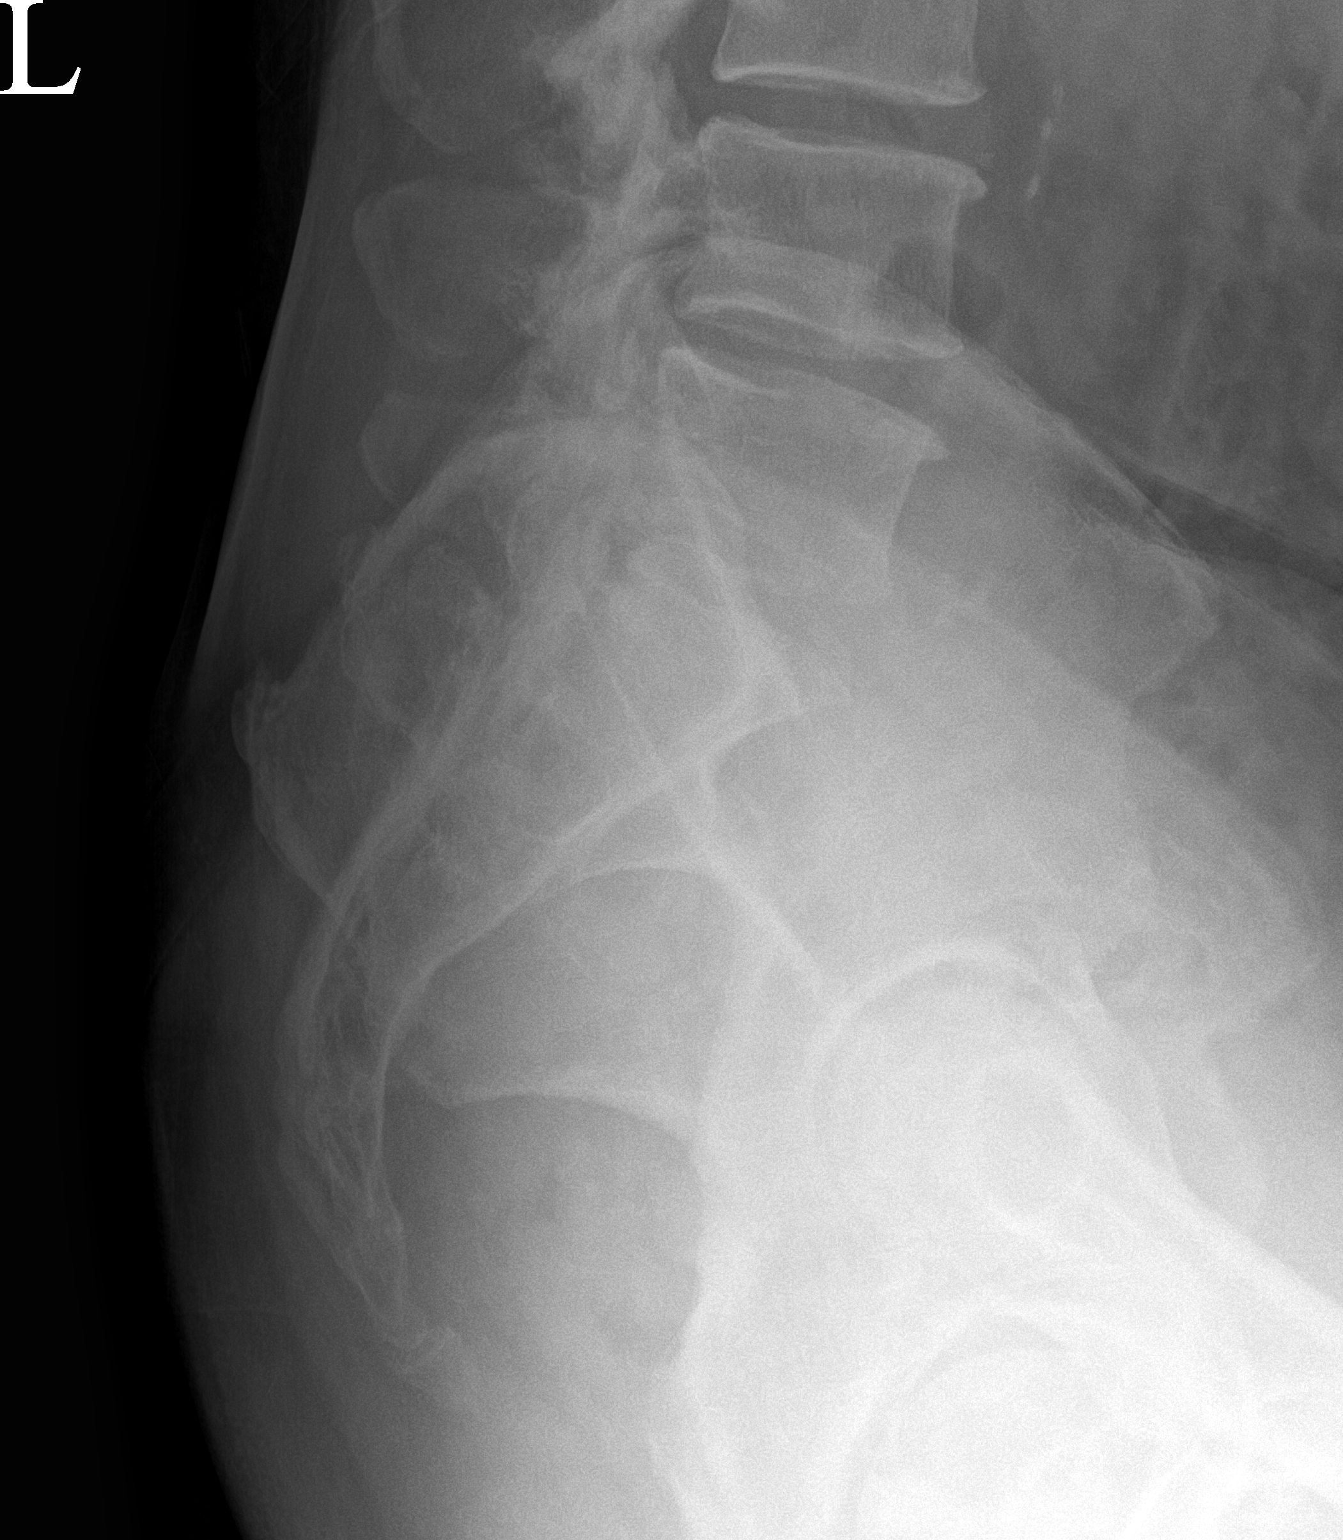

[3 of 3 positions shown; findings below may reference images not displayed]

FINDINGS: Frontal and lateral views of the lumbar spine are obtained. There
are 5 non-rib-bearing lumbar type vertebral bodies with mild left
convex curvature centered at L3. There are no acute fractures. Mild
spondylosis at L3-4 and L4-5. Diffuse facet hypertrophic changes are
seen, most pronounced at L3-4, L4-5 common L5-S1. Sacroiliac joints
are unremarkable.
IMPRESSION: 1. Lower lumbar spondylosis and facet hypertrophy.
2. Mild left convex scoliosis.
3. No acute fracture.

## 2022-08-07 ENCOUNTER — Other Ambulatory Visit: Payer: Self-pay | Admitting: Internal Medicine

## 2022-09-22 ENCOUNTER — Other Ambulatory Visit: Payer: Self-pay | Admitting: Internal Medicine

## 2022-09-26 ENCOUNTER — Ambulatory Visit (INDEPENDENT_AMBULATORY_CARE_PROVIDER_SITE_OTHER): Payer: Medicare Other | Admitting: Internal Medicine

## 2022-09-26 ENCOUNTER — Encounter: Payer: Self-pay | Admitting: Internal Medicine

## 2022-09-26 VITALS — BP 142/94 | HR 85 | Temp 97.8°F | Ht 78.0 in | Wt 233.0 lb

## 2022-09-26 DIAGNOSIS — E785 Hyperlipidemia, unspecified: Secondary | ICD-10-CM

## 2022-09-26 DIAGNOSIS — N486 Induration penis plastica: Secondary | ICD-10-CM | POA: Diagnosis not present

## 2022-09-26 DIAGNOSIS — E291 Testicular hypofunction: Secondary | ICD-10-CM

## 2022-09-26 DIAGNOSIS — F988 Other specified behavioral and emotional disorders with onset usually occurring in childhood and adolescence: Secondary | ICD-10-CM

## 2022-09-26 DIAGNOSIS — N320 Bladder-neck obstruction: Secondary | ICD-10-CM

## 2022-09-26 DIAGNOSIS — I10 Essential (primary) hypertension: Secondary | ICD-10-CM

## 2022-09-26 DIAGNOSIS — G8929 Other chronic pain: Secondary | ICD-10-CM

## 2022-09-26 DIAGNOSIS — M545 Low back pain, unspecified: Secondary | ICD-10-CM

## 2022-09-26 MED ORDER — AMPHETAMINE-DEXTROAMPHETAMINE 10 MG PO TABS: 10.0000 mg | ORAL_TABLET | Freq: Two times a day (BID) | ORAL | 0 refills | Status: DC

## 2022-09-26 NOTE — Assessment & Plan Note (Signed)
Doing better.   

## 2022-09-26 NOTE — Assessment & Plan Note (Addendum)
On penile Xyoflex  injections

## 2022-09-26 NOTE — Assessment & Plan Note (Signed)
Off Losartan. Re-start Losartan if BP is high -On Losartan if BP is high - not taking  -Victor Montes is getting his blood pressure check weekly at [facility] where he donates platelets .

## 2022-09-26 NOTE — Progress Notes (Signed)
Subjective:  Patient ID: Victor Montes, male    DOB: 1955-09-11  Age: 67 y.o. MRN: [id number]  CC: Medical Management of Chronic Issues (3 month f/u)   HPI Amarius Cuartas presents for hypogonadism, ADD, depression  Outpatient Medications Prior to Visit  Medication Sig Dispense Refill   b complex vitamins tablet Take 1 tablet by mouth daily.     cetirizine (ZYRTEC) 10 MG tablet Take 10 mg by mouth daily.     cyclobenzaprine (FLEXERIL) 10 MG tablet Take 1 tablet by mouth three times daily as needed for muscle spasms. 180 tablet 0   guaiFENesin (MUCINEX) 600 MG 12 hr tablet Take 600 mg by mouth daily with breakfast.     KRILL OIL PO Take by mouth.     MAGNESIUM PO Take 1 tablet by mouth daily.     montelukast (SINGULAIR) 10 MG tablet Take 1 tablet by mouth daily. 90 tablet 3   Multiple Vitamin (MULTIVITAMIN WITH MINERALS) TABS tablet Take 1 tablet by mouth daily.     nabumetone (RELAFEN) 750 MG tablet Take 1 tablet by mouth twice daily as needed for moderate pain. 180 tablet 0   sertraline (ZOLOFT) 50 MG tablet Take 1 tablet by mouth daily. 90 tablet 1   SYRINGE-NEEDLE, DISP, 3 ML (BD ECLIPSE SYRINGE) 25G X 1" 3 ML MISC USE AS DIRECTED FOR IM INJECTION 50 each 11   tadalafil (CIALIS) 5 MG tablet Take 1 tablet by mouth daily. 90 tablet 3   tamsulosin (FLOMAX) 0.4 MG CAPS capsule Take 1 capsule by mouth daily. 90 capsule 3   testosterone cypionate (DEPOTESTOSTERONE CYPIONATE) 200 MG/ML injection Inject 2ml into the muscle every 2 weeks. 12 mL 1   amphetamine-dextroamphetamine (ADDERALL) 10 MG tablet Take 1 tablet (10 mg total) by mouth 2 (two) times daily. 60 tablet 0   amphetamine-dextroamphetamine (ADDERALL) 10 MG tablet Take 1 tablet (10 mg total) by mouth 2 (two) times daily. 60 tablet 0   amphetamine-dextroamphetamine (ADDERALL) 10 MG tablet Take 1 tablet (10 mg total) by mouth 2 (two) times daily. 60 tablet 0   albuterol (VENTOLIN HFA) 108 (90 Base) MCG/ACT inhaler Inhale 2 puffs into  the lungs every 4 (four) hours as needed for wheezing or shortness of breath. (Patient not taking: Reported on 09/26/2022) 17 g 5   No facility-administered medications prior to visit.    ROS: Review of Systems  Constitutional:  Negative for appetite change and unexpected weight change.  HENT:  Negative for congestion, nosebleeds, sneezing, sore throat and trouble swallowing.   Eyes:  Negative for itching and visual disturbance.  Respiratory:  Negative for cough.   Cardiovascular:  Negative for chest pain, palpitations and leg swelling.  Gastrointestinal:  Negative for abdominal distention, blood in stool, diarrhea and nausea.  Genitourinary:  Negative for frequency and hematuria.  Musculoskeletal:  Positive for back pain. Negative for gait problem, joint swelling and neck pain.  Skin:  Negative for rash.  Neurological:  Negative for dizziness, tremors, speech difficulty and weakness.  Psychiatric/Behavioral:  Positive for decreased concentration and dysphoric mood. Negative for agitation, sleep disturbance and suicidal ideas. The patient is not nervous/anxious.     Objective:  BP (!) 142/94 (BP Location: Left Arm, Patient Position: Sitting, Cuff Size: Large)   Pulse 85   Temp 97.8 F (36.6 C) (Temporal)   Ht 6\' 6"  (1.981 m)   Wt 233 lb (105.7 kg)   SpO2 98%   BMI 26.93 kg/m   BP Readings from Last 3  Encounters:  09/26/22 (!) 142/94  06/27/22 (!) 140/90  04/17/22 (!) 170/84    Wt Readings from Last 3 Encounters:  09/26/22 233 lb (105.7 kg)  06/27/22 238 lb (108 kg)  03/27/22 246 lb 12.8 oz (111.9 kg)    Physical Exam Constitutional:      General: He is not in acute distress.    Appearance: Normal appearance. He is well-developed.     Comments: NAD  Eyes:     Conjunctiva/sclera: Conjunctivae normal.     Pupils: Pupils are equal, round, and reactive to light.  Neck:     Thyroid: No thyromegaly.     Vascular: No JVD.  Cardiovascular:     Rate and Rhythm: Normal rate  and regular rhythm.     Heart sounds: Normal heart sounds. No murmur heard.    No friction rub. No gallop.  Pulmonary:     Effort: Pulmonary effort is normal. No respiratory distress.     Breath sounds: Normal breath sounds. No wheezing or rales.  Chest:     Chest wall: No tenderness.  Abdominal:     General: Bowel sounds are normal. There is no distension.     Palpations: Abdomen is soft. There is no mass.     Tenderness: There is no abdominal tenderness. There is no guarding or rebound.  Musculoskeletal:        General: No tenderness. Normal range of motion.     Cervical back: Normal range of motion.  Lymphadenopathy:     Cervical: No cervical adenopathy.  Skin:    General: Skin is warm and dry.     Findings: No rash.  Neurological:     Mental Status: He is alert and oriented to person, place, and time.     Cranial Nerves: No cranial nerve deficit.     Motor: No abnormal muscle tone.     Coordination: Coordination normal.     Gait: Gait normal.     Deep Tendon Reflexes: Reflexes are normal and symmetric.  Psychiatric:        Behavior: Behavior normal.        Thought Content: Thought content normal.        Judgment: Judgment normal.     Lab Results  Component Value Date   WBC 13.2 (H) 03/27/2022   HGB 17.2 (H) 03/27/2022   HCT 51.1 03/27/2022   PLT 390.0 03/27/2022   GLUCOSE 84 03/27/2022   CHOL 206 (H) 03/27/2022   TRIG 249.0 (H) 03/27/2022   HDL 45.30 03/27/2022   LDLDIRECT 137.0 03/27/2022   LDLCALC 127 (H) 10/05/2021   ALT 30 03/27/2022   AST 20 03/27/2022   NA 140 03/27/2022   K 3.9 03/27/2022   CL 103 03/27/2022   CREATININE 1.10 03/27/2022   BUN 16 03/27/2022   CO2 27 03/27/2022   TSH 0.69 03/27/2022   PSA 2.19 03/27/2022   HGBA1C 5.6 07/11/2017    CT CARDIAC SCORING  Addendum Date: 06/18/2018   ADDENDUM REPORT: 06/18/2018 17:12 CLINICAL DATA:  Risk stratification EXAM: Coronary Calcium Score TECHNIQUE: The patient was scanned on a Siemens Somatom  64 slice scanner. Axial non-contrast 3 mm slices were carried out through the heart. The data set was analyzed on a dedicated work station and scored using the [place] method. FINDINGS: Non-cardiac: See separate report from [facility]. Ascending aorta: Normal diameter 3.5 cm Pericardium: Normal Coronary arteries: No calcium noted IMPRESSION: Coronary calcium score of 0. Charlton Haws Electronically Signed   By: Theron Arista  Eden Emms M.D.   On: 06/18/2018 17:12   Result Date: 06/18/2018 EXAM: OVER-READ INTERPRETATION  CT CHEST The following report is an over-read performed by radiologist Dr. Irish Lack of [facility] on 06/18/2018. This over-read does not include interpretation of cardiac or coronary anatomy or pathology. The coronary calcium score interpretation by the cardiologist is attached. COMPARISON:  None. FINDINGS: Vascular: No incidental findings. Mediastinum/Nodes: Visualized mediastinum and hilar regions show no evidence of lymphadenopathy or masses. Lungs/Pleura: Visualized lungs show no evidence of pulmonary edema, consolidation, pneumothorax, nodule or pleural fluid. Upper Abdomen: No acute abnormality. Musculoskeletal: Mild rightward convex scoliosis. Other: Bilateral gynecomastia. IMPRESSION: No significant incidental findings.  Bilateral gynecomastia present. Electronically Signed: By: Irish Lack M.D. On: 06/18/2018 17:02    Assessment & Plan:   Problem List Items Addressed This Visit     Hypogonadism in male   Relevant Orders   Lipid panel   Testosterone   Dyslipidemia - Primary    Cardiac CT calcium score is 0      Relevant Orders   TSH   Lipid panel   Comprehensive metabolic panel   Attention deficit disorder    Continue on Adderall.   Potential benefits of a long term stimulants use as well as potential risks  and complications were explained to the patient and were aknowledged.      Relevant Orders   TSH   Lipid panel   Hypertension    Off  Losartan. Re-start Losartan if BP is high -On Losartan if BP is high - not taking  -Jaquaylon is getting his blood pressure check weekly at [facility] where he donates platelets .      Relevant Orders   TSH   Lipid panel   Low back pain    Doing better      Bladder neck obstruction   Relevant Orders   PSA   Testosterone   Peyronie's disease    On penile Xyoflex  injections      Relevant Orders   TSH   Urinalysis   CBC with Differential/Platelet   Lipid panel   PSA      Meds ordered this encounter  Medications   amphetamine-dextroamphetamine (ADDERALL) 10 MG tablet    Sig: Take 1 tablet (10 mg total) by mouth 2 (two) times daily.    Dispense:  60 tablet    Refill:  0    Please fill on or after 10/24/22   amphetamine-dextroamphetamine (ADDERALL) 10 MG tablet    Sig: Take 1 tablet (10 mg total) by mouth 2 (two) times daily.    Dispense:  60 tablet    Refill:  0    Please fill on or after 11/23/22   amphetamine-dextroamphetamine (ADDERALL) 10 MG tablet    Sig: Take 1 tablet (10 mg total) by mouth 2 (two) times daily.    Dispense:  60 tablet    Refill:  0    Please fill on or after 12/23/22      Follow-up: Return in about 3 months (around 12/27/2022) for a follow-up visit.  Sonda Primes, MD

## 2022-09-26 NOTE — Assessment & Plan Note (Signed)
Continue on Adderall.   Potential benefits of a long term stimulants use as well as potential risks  and complications were explained to the patient and were aknowledged. 

## 2022-09-26 NOTE — Assessment & Plan Note (Signed)
Cardiac CT calcium score is 0 

## 2022-11-08 ENCOUNTER — Other Ambulatory Visit: Payer: Self-pay | Admitting: Internal Medicine

## 2022-12-18 ENCOUNTER — Telehealth: Payer: Self-pay | Admitting: *Deleted

## 2022-12-18 ENCOUNTER — Other Ambulatory Visit (INDEPENDENT_AMBULATORY_CARE_PROVIDER_SITE_OTHER): Payer: Medicare Other

## 2022-12-18 DIAGNOSIS — F988 Other specified behavioral and emotional disorders with onset usually occurring in childhood and adolescence: Secondary | ICD-10-CM | POA: Diagnosis not present

## 2022-12-18 DIAGNOSIS — E291 Testicular hypofunction: Secondary | ICD-10-CM

## 2022-12-18 DIAGNOSIS — I10 Essential (primary) hypertension: Secondary | ICD-10-CM

## 2022-12-18 DIAGNOSIS — N486 Induration penis plastica: Secondary | ICD-10-CM

## 2022-12-18 DIAGNOSIS — N320 Bladder-neck obstruction: Secondary | ICD-10-CM | POA: Diagnosis not present

## 2022-12-18 DIAGNOSIS — E785 Hyperlipidemia, unspecified: Secondary | ICD-10-CM | POA: Diagnosis not present

## 2022-12-18 LAB — CBC WITH DIFFERENTIAL/PLATELET
Basophils Absolute: 0.1 10*3/uL (ref 0.0–0.1)
Basophils Relative: 0.9 % (ref 0.0–3.0)
Eosinophils Absolute: 0 10*3/uL (ref 0.0–0.7)
Eosinophils Relative: 0.5 % (ref 0.0–5.0)
HCT: 54.8 % — ABNORMAL HIGH (ref 39.0–52.0)
Hemoglobin: 18.3 g/dL (ref 13.0–17.0)
Lymphocytes Relative: 14.6 % (ref 12.0–46.0)
Lymphs Abs: 1.4 10*3/uL (ref 0.7–4.0)
MCHC: 33.3 g/dL (ref 30.0–36.0)
MCV: 96.9 fl (ref 78.0–100.0)
Monocytes Absolute: 0.9 10*3/uL (ref 0.1–1.0)
Monocytes Relative: 9.8 % (ref 3.0–12.0)
Neutro Abs: 7 10*3/uL (ref 1.4–7.7)
Neutrophils Relative %: 74.2 % (ref 43.0–77.0)
Platelets: 352 10*3/uL (ref 150.0–400.0)
RBC: 5.65 Mil/uL (ref 4.22–5.81)
RDW: 13.4 % (ref 11.5–15.5)
WBC: 9.4 10*3/uL (ref 4.0–10.5)

## 2022-12-18 LAB — COMPREHENSIVE METABOLIC PANEL
ALT: 32 U/L (ref 0–53)
AST: 20 U/L (ref 0–37)
Albumin: 4.5 g/dL (ref 3.5–5.2)
Alkaline Phosphatase: 81 U/L (ref 39–117)
BUN: 20 mg/dL (ref 6–23)
CO2: 29 mEq/L (ref 19–32)
Calcium: 9.4 mg/dL (ref 8.4–10.5)
Chloride: 101 mEq/L (ref 96–112)
Creatinine, Ser: 1.28 mg/dL (ref 0.40–1.50)
GFR: 57.21 mL/min — ABNORMAL LOW (ref >60.00)
Glucose, Bld: 58 mg/dL — ABNORMAL LOW (ref 70–99)
Potassium: 4.2 mEq/L (ref 3.5–5.1)
Sodium: 140 mEq/L (ref 135–145)
Total Bilirubin: 1.1 mg/dL (ref 0.2–1.2)
Total Protein: 6.8 g/dL (ref 6.0–8.3)

## 2022-12-18 LAB — LIPID PANEL
Cholesterol: 180 mg/dL (ref 0–200)
HDL: 48.5 mg/dL (ref >39.00)
LDL Cholesterol: 100 mg/dL — ABNORMAL HIGH (ref 0–99)
NonHDL: 131.93
Total CHOL/HDL Ratio: 4
Triglycerides: 159 mg/dL — ABNORMAL HIGH (ref 0.0–149.0)
VLDL: 31.8 mg/dL (ref 0.0–40.0)

## 2022-12-18 LAB — URINALYSIS
Bilirubin Urine: NEGATIVE
Hgb urine dipstick: NEGATIVE
Ketones, ur: NEGATIVE
Leukocytes,Ua: NEGATIVE
Nitrite: NEGATIVE
Specific Gravity, Urine: <=1.005 — AB (ref 1.000–1.030)
Total Protein, Urine: NEGATIVE
Urine Glucose: NEGATIVE
Urobilinogen, UA: 0.2 (ref 0.0–1.0)
pH: 6.5 (ref 5.0–8.0)

## 2022-12-18 LAB — PSA: PSA: 2.49 ng/mL (ref 0.10–4.00)

## 2022-12-18 LAB — TSH: TSH: 1 u[IU]/mL (ref 0.35–5.50)

## 2022-12-18 LAB — TESTOSTERONE: Testosterone: 242.19 ng/dL — ABNORMAL LOW (ref 300.00–890.00)

## 2022-12-18 NOTE — Telephone Encounter (Signed)
Noted. Thanks.

## 2022-12-18 NOTE — Telephone Encounter (Signed)
CRITICAL VALUE STICKER  CRITICAL VALUE: Hemoglobin 18.2, hematocrit 53.7  RECEIVER (on-site recipient of call):Marcellina Jonsson, LPN   DATE & TIME NOTIFIED: 329 pm 12/18/22  MESSENGER (representative from lab):[facility] - unknown name  MD NOTIFIED: yes - by tele encounter and teams   TIME OF NOTIFICATION:330pm   RESPONSE:  will address

## 2022-12-20 ENCOUNTER — Encounter (INDEPENDENT_AMBULATORY_CARE_PROVIDER_SITE_OTHER): Payer: Self-pay

## 2022-12-21 ENCOUNTER — Other Ambulatory Visit: Payer: Self-pay | Admitting: Oncology

## 2022-12-21 DIAGNOSIS — Z006 Encounter for examination for normal comparison and control in clinical research program: Secondary | ICD-10-CM

## 2022-12-26 ENCOUNTER — Ambulatory Visit (INDEPENDENT_AMBULATORY_CARE_PROVIDER_SITE_OTHER): Payer: Medicare Other | Admitting: Internal Medicine

## 2022-12-26 ENCOUNTER — Encounter: Payer: Self-pay | Admitting: Internal Medicine

## 2022-12-26 VITALS — BP 118/70 | HR 84 | Temp 98.6°F | Ht 78.0 in | Wt 239.0 lb

## 2022-12-26 DIAGNOSIS — M2550 Pain in unspecified joint: Secondary | ICD-10-CM | POA: Diagnosis not present

## 2022-12-26 DIAGNOSIS — M25551 Pain in right hip: Secondary | ICD-10-CM

## 2022-12-26 DIAGNOSIS — M45A6 Non-radiographic axial spondyloarthritis of lumbar region: Secondary | ICD-10-CM

## 2022-12-26 DIAGNOSIS — E291 Testicular hypofunction: Secondary | ICD-10-CM | POA: Diagnosis not present

## 2022-12-26 DIAGNOSIS — I10 Essential (primary) hypertension: Secondary | ICD-10-CM

## 2022-12-26 DIAGNOSIS — D751 Secondary polycythemia: Secondary | ICD-10-CM

## 2022-12-26 DIAGNOSIS — M25552 Pain in left hip: Secondary | ICD-10-CM

## 2022-12-26 DIAGNOSIS — M549 Dorsalgia, unspecified: Secondary | ICD-10-CM

## 2022-12-26 DIAGNOSIS — G8929 Other chronic pain: Secondary | ICD-10-CM

## 2022-12-26 DIAGNOSIS — M545 Low back pain, unspecified: Secondary | ICD-10-CM

## 2022-12-26 DIAGNOSIS — E785 Hyperlipidemia, unspecified: Secondary | ICD-10-CM | POA: Diagnosis not present

## 2022-12-26 DIAGNOSIS — F988 Other specified behavioral and emotional disorders with onset usually occurring in childhood and adolescence: Secondary | ICD-10-CM

## 2022-12-26 DIAGNOSIS — M533 Sacrococcygeal disorders, not elsewhere classified: Secondary | ICD-10-CM | POA: Diagnosis not present

## 2022-12-26 LAB — URIC ACID: Uric Acid, Serum: 4.9 mg/dL (ref 4.0–7.8)

## 2022-12-26 LAB — SEDIMENTATION RATE: Sed Rate: 10 mm/hr (ref 0–20)

## 2022-12-26 MED ORDER — AMPHETAMINE-DEXTROAMPHETAMINE 10 MG PO TABS: 10.0000 mg | ORAL_TABLET | Freq: Two times a day (BID) | ORAL | 0 refills | Status: DC

## 2022-12-26 MED ORDER — AMPHETAMINE-DEXTROAMPHETAMINE 10 MG PO TABS
10.0000 mg | ORAL_TABLET | Freq: Two times a day (BID) | ORAL | 0 refills | Status: DC
Start: 2022-12-26 — End: 2023-04-24

## 2022-12-26 NOTE — Assessment & Plan Note (Signed)
Cont on Flexeril, Nabumetone Rx prn Ortho ref  Labs - HLA B27

## 2022-12-26 NOTE — Assessment & Plan Note (Signed)
Continue on Adderall.   Potential benefits of a long term stimulants use as well as potential risks  and complications were explained to the patient and were aknowledged.

## 2022-12-26 NOTE — Assessment & Plan Note (Signed)
Worse. Pain management in [place], [place].  He may need another injection in the hip.  Obtain lab work

## 2022-12-26 NOTE — Assessment & Plan Note (Addendum)
Start Phlebotomies ASAP

## 2022-12-26 NOTE — Assessment & Plan Note (Signed)
Worse ASA qd Start Phlebotomies ASAP

## 2022-12-26 NOTE — Assessment & Plan Note (Signed)
B OA F/u w/Pain management.  He may need another injection in the hip.  Obtain lab work  S/p steroid shot x1

## 2022-12-26 NOTE — Assessment & Plan Note (Signed)
BP Readings from Last 3 Encounters:  12/26/22 118/70  09/26/22 (!) 142/94  06/27/22 (!) 140/90

## 2022-12-26 NOTE — Assessment & Plan Note (Signed)
Cardiac CT calcium score is 0 

## 2022-12-26 NOTE — Progress Notes (Signed)
Subjective:  Patient ID: Victor Montes, male    DOB: 06-May-1956  Age: 68 y.o. MRN: [id number]  CC: Follow-up (3 mnth f/u)   HPI Victor Montes presents for severe pain in the SI joints worse w/standing, better w/sitting  H/o B hip OA - shots helped in the past H/o LBP F/u on ADD, polycythemia  Outpatient Medications Prior to Visit  Medication Sig Dispense Refill   b complex vitamins tablet Take 1 tablet by mouth daily.     cetirizine (ZYRTEC) 10 MG tablet Take 10 mg by mouth daily.     cyclobenzaprine (FLEXERIL) 10 MG tablet Take 1 tablet by mouth three times daily as needed for muscle spasms. 180 tablet 0   guaiFENesin (MUCINEX) 600 MG 12 hr tablet Take 600 mg by mouth daily with breakfast.     KRILL OIL PO Take by mouth.     MAGNESIUM PO Take 1 tablet by mouth daily.     montelukast (SINGULAIR) 10 MG tablet Take 1 tablet by mouth daily. 90 tablet 3   Multiple Vitamin (MULTIVITAMIN WITH MINERALS) TABS tablet Take 1 tablet by mouth daily.     nabumetone (RELAFEN) 750 MG tablet Take 1 tablet by mouth twice daily as needed for moderate pain. 180 tablet 0   sertraline (ZOLOFT) 50 MG tablet Take 1 tablet by mouth daily. 90 tablet 1   SYRINGE-NEEDLE, DISP, 3 ML (BD ECLIPSE SYRINGE) 25G X 1" 3 ML MISC USE AS DIRECTED FOR IM INJECTION 50 each 11   tadalafil (CIALIS) 5 MG tablet Take 1 tablet by mouth daily. 90 tablet 3   tamsulosin (FLOMAX) 0.4 MG CAPS capsule Take 1 capsule by mouth daily. 90 capsule 3   testosterone cypionate (DEPOTESTOSTERONE CYPIONATE) 200 MG/ML injection Inject 2ml into the muscle every 2 weeks. 12 mL 1   amphetamine-dextroamphetamine (ADDERALL) 10 MG tablet Take 1 tablet (10 mg total) by mouth 2 (two) times daily. 60 tablet 0   amphetamine-dextroamphetamine (ADDERALL) 10 MG tablet Take 1 tablet (10 mg total) by mouth 2 (two) times daily. 60 tablet 0   amphetamine-dextroamphetamine (ADDERALL) 10 MG tablet Take 1 tablet (10 mg total) by mouth 2 (two) times daily. 60  tablet 0   No facility-administered medications prior to visit.    ROS: Review of Systems  Constitutional:  Negative for appetite change, fatigue and unexpected weight change.  HENT:  Negative for congestion, nosebleeds, sneezing, sore throat and trouble swallowing.   Eyes:  Negative for itching and visual disturbance.  Respiratory:  Negative for cough.   Cardiovascular:  Negative for chest pain, palpitations and leg swelling.  Gastrointestinal:  Negative for abdominal distention, blood in stool, diarrhea and nausea.  Genitourinary:  Negative for frequency and hematuria.  Musculoskeletal:  Positive for arthralgias, back pain and gait problem. Negative for joint swelling and neck pain.  Skin:  Positive for color change. Negative for rash.  Neurological:  Negative for dizziness, tremors, speech difficulty and weakness.  Psychiatric/Behavioral:  Negative for agitation, dysphoric mood, sleep disturbance and suicidal ideas. The patient is not nervous/anxious.     Objective:  BP 118/70 (BP Location: Right Arm, Patient Position: Sitting, Cuff Size: Large)   Pulse 84   Temp 98.6 F (37 C) (Oral)   Ht 6\' 6"  (1.981 m)   Wt 239 lb (108.4 kg)   SpO2 92%   BMI 27.62 kg/m   BP Readings from Last 3 Encounters:  12/26/22 118/70  09/26/22 (!) 142/94  06/27/22 (!) 140/90    Wt  Readings from Last 3 Encounters:  12/26/22 239 lb (108.4 kg)  09/26/22 233 lb (105.7 kg)  06/27/22 238 lb (108 kg)    Physical Exam Constitutional:      General: He is not in acute distress.    Appearance: Normal appearance. He is well-developed.     Comments: NAD  Eyes:     Conjunctiva/sclera: Conjunctivae normal.     Pupils: Pupils are equal, round, and reactive to light.  Neck:     Thyroid: No thyromegaly.     Vascular: No JVD.  Cardiovascular:     Rate and Rhythm: Normal rate and regular rhythm.     Heart sounds: Normal heart sounds. No murmur heard.    No friction rub. No gallop.  Pulmonary:      Effort: Pulmonary effort is normal. No respiratory distress.     Breath sounds: Normal breath sounds. No wheezing or rales.  Chest:     Chest wall: No tenderness.  Abdominal:     General: Bowel sounds are normal. There is no distension.     Palpations: Abdomen is soft. There is no mass.     Tenderness: There is no abdominal tenderness. There is no guarding or rebound.  Musculoskeletal:        General: No tenderness. Normal range of motion.     Cervical back: Normal range of motion.  Lymphadenopathy:     Cervical: No cervical adenopathy.  Skin:    General: Skin is warm and dry.     Findings: No rash.  Neurological:     Mental Status: He is alert and oriented to person, place, and time.     Cranial Nerves: No cranial nerve deficit.     Motor: No abnormal muscle tone.     Coordination: Coordination normal.     Gait: Gait abnormal.     Deep Tendon Reflexes: Reflexes are normal and symmetric.  Psychiatric:        Behavior: Behavior normal.        Thought Content: Thought content normal.        Judgment: Judgment normal.   Antalgic gait Full ROM in hips Purplish face Str leg elev (-) B Painful LS hyperextending    A total time of 45 minutes was spent preparing to see the patient, reviewing tests, x-rays, operative reports and other medical records.  Also, obtaining history and performing comprehensive physical exam.  Additionally, counseling the patient regarding the above listed issues: ADD, pain, polycythemia...   Lab Results  Component Value Date   WBC 9.4 12/18/2022   HGB 18.3 (HH) 12/18/2022   HCT 54.8 (H) 12/18/2022   PLT 352.0 12/18/2022   GLUCOSE 58 (L) 12/18/2022   CHOL 180 12/18/2022   TRIG 159.0 (H) 12/18/2022   HDL 48.50 12/18/2022   LDLDIRECT 137.0 03/27/2022   LDLCALC 100 (H) 12/18/2022   ALT 32 12/18/2022   AST 20 12/18/2022   NA 140 12/18/2022   K 4.2 12/18/2022   CL 101 12/18/2022   CREATININE 1.28 12/18/2022   BUN 20 12/18/2022   CO2 29 12/18/2022    TSH 1.00 12/18/2022   PSA 2.49 12/18/2022   HGBA1C 5.6 07/11/2017    CT CARDIAC SCORING  Addendum Date: 06/18/2018   ADDENDUM REPORT: 06/18/2018 17:12 CLINICAL DATA:  Risk stratification EXAM: Coronary Calcium Score TECHNIQUE: The patient was scanned on a Siemens Somatom 64 slice scanner. Axial non-contrast 3 mm slices were carried out through the heart. The data set was analyzed on a dedicated  work station and scored using the [employer] . FINDINGS: Non-cardiac: See separate report from [facility]. Ascending aorta: Normal diameter 3.5 cm Pericardium: Normal Coronary arteries: No calcium noted IMPRESSION: Coronary calcium score of 0. Charlton Haws Electronically Signed   By: Charlton Haws M.D.   On: 06/18/2018 17:12   Result Date: 06/18/2018 EXAM: OVER-READ INTERPRETATION  CT CHEST The following report is an over-read performed by radiologist Dr. Irish Lack of [facility] on 06/18/2018. This over-read does not include interpretation of cardiac or coronary anatomy or pathology. The coronary calcium score interpretation by the cardiologist is attached. COMPARISON:  None. FINDINGS: Vascular: No incidental findings. Mediastinum/Nodes: Visualized mediastinum and hilar regions show no evidence of lymphadenopathy or masses. Lungs/Pleura: Visualized lungs show no evidence of pulmonary edema, consolidation, pneumothorax, nodule or pleural fluid. Upper Abdomen: No acute abnormality. Musculoskeletal: Mild rightward convex scoliosis. Other: Bilateral gynecomastia. IMPRESSION: No significant incidental findings.  Bilateral gynecomastia present. Electronically Signed: By: Irish Lack M.D. On: 06/18/2018 17:02    Assessment & Plan:   Problem List Items Addressed This Visit     Hypogonadism in male    Start Phlebotomies      Dyslipidemia    Cardiac CT calcium score is 0      Hypertension    BP Readings from Last 3 Encounters:  12/26/22 118/70  09/26/22 (!) 142/94   06/27/22 (!) 140/90         Hip pain    B OA F/u w/Pain management.  He may need another injection in the hip.  Obtain lab work  S/p steroid shot x1      Relevant Medications   amphetamine-dextroamphetamine (ADDERALL) 10 MG tablet   Other Relevant Orders   Ambulatory referral to Orthopedic Surgery   Rheumatoid factor   Sedimentation rate   HLA-B27 antigen   Uric acid   Chronic SI joint pain - Primary    Worse. Pain management in [place], [place].  He may need another injection in the hip.  Obtain lab work       Relevant Medications   amphetamine-dextroamphetamine (ADDERALL) 10 MG tablet   Other Relevant Orders   Ambulatory referral to Orthopedic Surgery   Rheumatoid factor   Sedimentation rate   HLA-B27 antigen   Uric acid   Other Visit Diagnoses     Arthralgia, unspecified joint       Relevant Orders   Rheumatoid factor   Sedimentation rate   HLA-B27 antigen   Uric acid   Chronic bilateral back pain, unspecified back location       Relevant Orders   HLA-B27 antigen   Non-radiographic axial spondyloarthritis of lumbar region [facility])       Relevant Orders   HLA-B27 antigen         Meds ordered this encounter  Medications   amphetamine-dextroamphetamine (ADDERALL) 10 MG tablet    Sig: Take 1 tablet (10 mg total) by mouth 2 (two) times daily.    Dispense:  60 tablet    Refill:  0    Please fill on or after 03/23/23   amphetamine-dextroamphetamine (ADDERALL) 10 MG tablet    Sig: Take 1 tablet (10 mg total) by mouth 2 (two) times daily.    Dispense:  60 tablet    Refill:  0    Please fill on or after 01/22/03/24   amphetamine-dextroamphetamine (ADDERALL) 10 MG tablet    Sig: Take 1 tablet (10 mg total) by mouth 2 (two) times daily.    Dispense:  60 tablet    Refill:  0    Please fill on or after 02/21/23      Follow-up: Return in about 4 months (around 04/27/2023) for a follow-up visit.  Sonda Primes, MD

## 2023-01-01 ENCOUNTER — Encounter: Payer: Self-pay | Admitting: Internal Medicine

## 2023-01-01 NOTE — Addendum Note (Signed)
Addended by: Tresa Garter on: 01/01/2023 08:01 AM   Modules accepted: Orders

## 2023-01-10 ENCOUNTER — Encounter: Payer: Self-pay | Admitting: Internal Medicine

## 2023-01-12 ENCOUNTER — Ambulatory Visit (INDEPENDENT_AMBULATORY_CARE_PROVIDER_SITE_OTHER): Payer: Medicare Other

## 2023-01-12 VITALS — Ht 78.0 in | Wt 239.0 lb

## 2023-01-12 DIAGNOSIS — Z0000 Encounter for general adult medical examination without abnormal findings: Secondary | ICD-10-CM

## 2023-01-12 NOTE — Progress Notes (Cosign Needed)
Subjective:   Victor Montes is a 67 y.o. male who presents for Medicare Annual/Subsequent preventive examination.  Visit Complete: Virtual  I connected with  Victor Montes on 01/12/23 by a audio enabled telemedicine application and verified that I am speaking with the correct person using two identifiers.  Patient Location: Home  Provider Location: Office/Clinic  I discussed the limitations of evaluation and management by telemedicine. The patient expressed understanding and agreed to proceed.  Patient Medicare AWV questionnaire was completed by the patient on 01/08/2023; I have confirmed that all information answered by patient is correct and no changes since this date.  Vital Signs: Because this visit was a virtual/telehealth visit, some criteria may be missing or patient reported. Any vitals not documented were not able to be obtained and vitals that have been documented are patient reported.    Review of Systems    Cardiac Risk Factors include: advanced age (>23men, >61 women);male gender;dyslipidemia;hypertension;Other (see comment), Risk factor comments: OSA     Objective:    Today's Vitals   01/08/23 1626 01/12/23 1030  Weight:  239 lb (108.4 kg)  Height:  6\' 6"  (1.981 m)  PainSc: 6     Body mass index is 27.62 kg/m.     01/12/2023   10:42 AM 01/09/2022   11:00 AM 12/27/2020    9:35 AM 06/09/2016    9:21 AM  Advanced Directives  Does Patient Have a Medical Advance Directive? Yes No No No  Type of Estate agent of [place];Living will     Copy of Healthcare Power of Attorney in Chart? No - copy requested     Would patient like information on creating a medical advance directive?  No - Patient declined No - Patient declined No - Patient declined    Current Medications (verified) Outpatient Encounter Medications as of 01/12/2023  Medication Sig   amphetamine-dextroamphetamine (ADDERALL) 10 MG tablet Take 1 tablet (10 mg total) by mouth 2 (two)  times daily.   aspirin 325 MG tablet Take 325 mg by mouth daily.   b complex vitamins tablet Take 1 tablet by mouth daily.   cetirizine (ZYRTEC) 10 MG tablet Take 10 mg by mouth daily.   cyclobenzaprine (FLEXERIL) 10 MG tablet Take 1 tablet by mouth three times daily as needed for muscle spasms.   KRILL OIL PO Take by mouth.   MAGNESIUM PO Take 1 tablet by mouth daily.   montelukast (SINGULAIR) 10 MG tablet Take 1 tablet by mouth daily.   Multiple Vitamin (MULTIVITAMIN WITH MINERALS) TABS tablet Take 1 tablet by mouth daily.   nabumetone (RELAFEN) 750 MG tablet Take 1 tablet by mouth twice daily as needed for moderate pain.   sertraline (ZOLOFT) 50 MG tablet Take 1 tablet by mouth daily.   SYRINGE-NEEDLE, DISP, 3 ML (BD ECLIPSE SYRINGE) 25G X 1" 3 ML MISC USE AS DIRECTED FOR IM INJECTION   tadalafil (CIALIS) 5 MG tablet Take 1 tablet by mouth daily.   tamsulosin (FLOMAX) 0.4 MG CAPS capsule Take 1 capsule by mouth daily.   testosterone cypionate (DEPOTESTOSTERONE CYPIONATE) 200 MG/ML injection Inject 2ml into the muscle every 2 weeks.   amphetamine-dextroamphetamine (ADDERALL) 10 MG tablet Take 1 tablet (10 mg total) by mouth 2 (two) times daily.   amphetamine-dextroamphetamine (ADDERALL) 10 MG tablet Take 1 tablet (10 mg total) by mouth 2 (two) times daily.   guaiFENesin (MUCINEX) 600 MG 12 hr tablet Take 600 mg by mouth daily with breakfast. (Patient not taking: Reported on  01/12/2023)   No facility-administered encounter medications on file as of 01/12/2023.    Allergies (verified) Patient has no known allergies.   History: Past Medical History:  Diagnosis Date   ADD (attention deficit disorder)    Allergic rhinitis    Allergy    Asthma    Cancer (HCC)    basal cell skin cancer    Cervical radiculopathy 2007   Right- Dr. Gerlene Fee    Constipation 2011   Glucose intolerance (impaired glucose tolerance)    Heart murmur    Hyperlipidemia    Hypogonadism male    Occipital  neuralgia 2011   Left   OSA (obstructive sleep apnea)    Sleep apnea    no cpap currently    Past Surgical History:  Procedure Laterality Date   COLONOSCOPY     POLYPECTOMY     UVULOPALATOPLASTY     s/p   Family History  Problem Relation Age of Onset   Colon cancer Mother 23   Cancer Mother 51       colon ca   Stroke Father    Heart disease Father        A fib   Hypertension Other    Coronary artery disease Neg Hx    Colon polyps Neg Hx    Esophageal cancer Neg Hx    Rectal cancer Neg Hx    Stomach cancer Neg Hx    Social History   Socioeconomic History   Marital status: Single    Spouse name: Not on file   Number of children: Not on file   Years of education: Not on file   Highest education level: 12th grade  Occupational History   Occupation: Retired  Tobacco Use   Smoking status: Some Days    Current packs/day: 0.10    Average packs/day: 0.1 packs/day for 20.0 years (2.0 ttl pk-yrs)    Types: E-cigarettes, Cigarettes   Smokeless tobacco: Never   Tobacco comments:    does vape    Vaping Use   Vaping status: Some Days   Substances: Nicotine, Flavoring, Nicotine-salt  Substance and Sexual Activity   Alcohol use: Not Currently    Alcohol/week: 3.0 standard drinks of alcohol    Types: 3 Cans of beer per week   Drug use: No   Sexual activity: Yes  Other Topics Concern   Not on file  Social History Narrative   Lives alone.   Social Determinants of Health   Financial Resource Strain: Low Risk  (01/08/2023)   Overall Financial Resource Strain (CARDIA)    Difficulty of Paying Living Expenses: Not hard at all  Food Insecurity: No Food Insecurity (01/08/2023)   Hunger Vital Sign    Worried About Running Out of Food in the Last Year: Never true    Ran Out of Food in the Last Year: Never true  Transportation Needs: No Transportation Needs (01/08/2023)   PRAPARE - Administrator, Civil Service (Medical): No    Lack of Transportation (Non-Medical):  No  Physical Activity: Insufficiently Active (01/08/2023)   Exercise Vital Sign    Days of Exercise per Week: 1 day    Minutes of Exercise per Session: 20 min  Stress: No Stress Concern Present (01/08/2023)   [employer] of Occupational Health - Occupational Stress Questionnaire    Feeling of Stress : Not at all  Social Connections: Moderately Isolated (01/08/2023)   Social Connection and Isolation Panel [NHANES]    Frequency of Communication with Friends  and Family: Three times a week    Frequency of Social Gatherings with Friends and Family: Patient declined    Attends Religious Services: 1 to 4 times per year    Active Member of [employer] or Organizations: No    Attends Engineer, structural: Patient declined    Marital Status: Never married    Tobacco Counseling Ready to quit: Not Answered Counseling given: Not Answered Tobacco comments: does vape     Clinical Intake:  Pre-visit preparation completed: Yes  Pain : 0-10 Pain Score: 6  (when standing-per pt) Pain Location: Hip Pain Orientation: Left Pain Radiating Towards: lower back Pain Descriptors / Indicators: Aching, Discomfort Pain Onset: More than a month ago Pain Frequency: Constant Pain Relieving Factors: Relafen 750 mg Effect of Pain on Daily Activities: when walking a distance  Pain Relieving Factors: Relafen 750 mg  BMI - recorded: 27.62 Nutritional Status: BMI 25 -29 Overweight Nutritional Risks: None  How often do you need to have someone help you when you read instructions, pamphlets, or other written materials from your doctor or pharmacy?: 1 - Never  Interpreter Needed?: No  Information entered by :: Raunel Dimartino, RMA   Activities of Daily Living    01/08/2023    4:26 PM  In your present state of health, do you have any difficulty performing the following activities:  Hearing? 0  Difficulty concentrating or making decisions? 0  Walking or climbing stairs? 0  Dressing or bathing? 0   Doing errands, shopping? 0  Preparing Food and eating ? N  Using the Toilet? N  In the past six months, have you accidently leaked urine? Y  Do you have problems with loss of bowel control? N  Managing your Medications? N  Managing your Finances? N  Housekeeping or managing your Housekeeping? N    Patient Care Team: Plotnikov, Georgina Quint, MD as PCP - General  Indicate any recent Medical Services you may have received from other than [facility] providers in the past year (date may be approximate).     Assessment:   This is a routine wellness examination for [employer].  Hearing/Vision screen Hearing Screening - Comments:: Denies hearing difficulties   Vision Screening - Comments:: Wears eyeglasses and contacts  Dietary issues and exercise activities discussed:     Goals Addressed             This Visit's Progress    Patient Stated   Not on track    Now that I am retired, I will increase my physical activity, continue to watch my diet and stay very hydrated.      Depression Screen    01/12/2023   10:44 AM 12/26/2022   11:20 AM 09/26/2022    1:26 PM 06/27/2022    1:23 PM 01/10/2022   11:20 AM 01/09/2022   11:01 AM 01/09/2022   11:00 AM  PHQ 2/9 Scores  PHQ - 2 Score 0 0 0 0 0 0 0  PHQ- 9 Score 3 0 2 0 2      Fall Risk    01/08/2023    4:26 PM 09/26/2022    1:26 PM 06/27/2022    1:22 PM 01/10/2022   11:20 AM 01/09/2022   11:01 AM  Fall Risk   Falls in the past year? 1 0 0 0 0  Number falls in past yr: 1 0 0 0 0  Injury with Fall? 0 0 0 0 0  Risk for fall due to :  No Fall  Risks No Fall Risks No Fall Risks   Follow up Falls evaluation completed;Falls prevention discussed Falls evaluation completed Falls evaluation completed  Falls evaluation completed;Education provided    MEDICARE RISK AT HOME: Medicare Risk at Home Any stairs in or around the home?: No If so, are there any without handrails?: No Home free of loose throw rugs in walkways, pet beds, electrical cords, etc?:  Yes Adequate lighting in your home to reduce risk of falls?: Yes Life alert?: No Use of a cane, walker or w/c?: No Grab bars in the bathroom?: Yes Shower chair or bench in shower?: No Elevated toilet seat or a handicapped toilet?: No  TIMED UP AND GO:  Was the test performed?  No    Cognitive Function:        01/12/2023   10:43 AM  6CIT Screen  What Year? 0 points  What month? 0 points  What time? 0 points  Count back from 20 0 points  Months in reverse 0 points  Repeat phrase 0 points  Total Score 0 points    Immunizations Immunization History  Administered Date(s) Administered   Fluad Quad(high Dose 65+) 03/31/2019, 03/30/2020, 03/30/2021, 03/27/2022   Influenza Split 03/01/2011   Influenza,inj,Quad PF,6+ Mos 01/16/2014, 07/05/2016, 02/04/2018   Influenza-Unspecified 03/17/2013   PFIZER(Purple Top)SARS-COV-2 Vaccination 07/21/2019, 08/19/2019, 03/05/2020   PNEUMOCOCCAL CONJUGATE-20 06/27/2022   Tdap 09/20/2011, 07/16/2022   Zoster Recombinant(Shingrix) 07/16/2022, 09/16/2022    TDAP status: Up to date  Flu Vaccine status: Due, Education has been provided regarding the importance of this vaccine. Advised may receive this vaccine at local pharmacy or Health Dept. Aware to provide a copy of the vaccination record if obtained from local pharmacy or Health Dept. Verbalized acceptance and understanding.  Pneumococcal vaccine status: Up to date  Covid-19 vaccine status: Information provided on how to obtain vaccines.   Qualifies for Shingles Vaccine? Yes   Zostavax completed Yes   Shingrix Completed?: Yes  Screening Tests Health Maintenance  Topic Date Due   COVID-19 Vaccine (4 - 2023-24 season) 01/20/2022   INFLUENZA VACCINE  12/21/2022   Colonoscopy  02/11/2023   Medicare Annual Wellness (AWV)  01/12/2024   DTaP/Tdap/Td (3 - Td or Tdap) 07/16/2032   Pneumonia Vaccine 55+ Years old  Completed   Hepatitis C Screening  Completed   HPV VACCINES  Aged Out    Zoster Vaccines- Shingrix  Discontinued    Health Maintenance  Health Maintenance Due  Topic Date Due   COVID-19 Vaccine (4 - 2023-24 season) 01/20/2022   INFLUENZA VACCINE  12/21/2022   Colonoscopy  02/11/2023    Colorectal cancer screening: Type of screening: Colonoscopy. Completed 02/11/2020. Repeat every 3 years  Lung Cancer Screening: (Low Dose CT Chest recommended if Age 44-80 years, 20 pack-year currently smoking OR have quit w/in 15years.) does not qualify.   Lung Cancer Screening Referral: N/A  Additional Screening:  Hepatitis C Screening: does qualify; Completed 11/29/2015  Vision Screening: Recommended annual ophthalmology exams for early detection of glaucoma and other disorders of the eye. Is the patient up to date with their annual eye exam?  No  Who is the provider or what is the name of the office in which the patient attends annual eye exams? [facility] If pt is not established with a provider, would they like to be referred to a provider to establish care? No .   Dental Screening: Recommended annual dental exams for proper oral hygiene   Community Resource Referral / Chronic Care  Management: CRR required this visit?  No   CCM required this visit?  No     Plan:     I have personally reviewed and noted the following in the patient's chart:   Medical and social history Use of alcohol, tobacco or illicit drugs  Current medications and supplements including opioid prescriptions. Patient is not currently taking opioid prescriptions. Functional ability and status Nutritional status Physical activity Advanced directives List of other physicians Hospitalizations, surgeries, and ER visits in previous 12 months Vitals Screenings to include cognitive, depression, and falls Referrals and appointments  In addition, I have reviewed and discussed with patient certain preventive protocols, quality metrics, and best practice recommendations. A  written personalized care plan for preventive services as well as general preventive health recommendations were provided to patient.     Lycan Davee L Tersea Aulds, CMA   01/12/2023   After Visit Summary: (MyChart) Due to this being a telephonic visit, the after visit summary with patients personalized plan was offered to patient via MyChart   Nurse Notes: Patient is due for a Flu and Covid vaccine.  He is also due for an annual eye exam ans also a colonoscopy.  Patient stated that he will call to get these scheduled soon.    Medical screening examination/treatment/procedure(s) were performed by non-physician practitioner and as supervising physician I was immediately available for consultation/collaboration.  I agree with above. Jacinta Shoe, MD

## 2023-01-12 NOTE — Patient Instructions (Addendum)
Mr. Hinzman , Thank you for taking time to come for your Medicare Wellness Visit. I appreciate your ongoing commitment to your health goals. Please review the following plan we discussed and let me know if I can assist you in the future.   Referrals/Orders/Follow-Ups/Clinician Recommendations: You will soon be due for Flu and Covid vaccines, as you can get these done at your pharmacy.  Remember to call and schedule an annual eye exam as well as a colonoscopy soon. It was nice talking with you today.  Aim for 30 minutes of exercise or brisk walking in place, 6-8 glasses of water, and 5 servings of fruits and vegetables each day.   This is a list of the screening recommended for you and due dates:  Health Maintenance  Topic Date Due   COVID-19 Vaccine (4 - 2023-24 season) 01/20/2022   Flu Shot  12/21/2022   Colon Cancer Screening  02/11/2023   Medicare Annual Wellness Visit  01/12/2024   DTaP/Tdap/Td vaccine (3 - Td or Tdap) 07/16/2032   Pneumonia Vaccine  Completed   Hepatitis C Screening  Completed   HPV Vaccine  Aged Out   Zoster (Shingles) Vaccine  Discontinued    Advanced directives: (Copy Requested) Please bring a copy of your health care power of attorney and living will to the office to be added to your chart at your convenience.  Next Medicare Annual Wellness Visit scheduled for next year: Yes

## 2023-01-18 ENCOUNTER — Other Ambulatory Visit (INDEPENDENT_AMBULATORY_CARE_PROVIDER_SITE_OTHER): Payer: Medicare Other

## 2023-01-18 DIAGNOSIS — G8929 Other chronic pain: Secondary | ICD-10-CM

## 2023-01-18 DIAGNOSIS — M533 Sacrococcygeal disorders, not elsewhere classified: Secondary | ICD-10-CM | POA: Diagnosis not present

## 2023-01-18 LAB — COMPREHENSIVE METABOLIC PANEL
ALT: 29 U/L (ref 0–53)
AST: 22 U/L (ref 0–37)
Albumin: 3.9 g/dL (ref 3.5–5.2)
Alkaline Phosphatase: 61 U/L (ref 39–117)
BUN: 16 mg/dL (ref 6–23)
CO2: 30 mEq/L (ref 19–32)
Calcium: 8.8 mg/dL (ref 8.4–10.5)
Chloride: 101 mEq/L (ref 96–112)
Creatinine, Ser: 1.06 mg/dL (ref 0.40–1.50)
GFR: 71.69 mL/min (ref >60.00)
Glucose, Bld: 124 mg/dL — ABNORMAL HIGH (ref 70–99)
Potassium: 3.6 mEq/L (ref 3.5–5.1)
Sodium: 140 mEq/L (ref 135–145)
Total Bilirubin: 1 mg/dL (ref 0.2–1.2)
Total Protein: 6.2 g/dL (ref 6.0–8.3)

## 2023-01-19 LAB — RHEUMATOID FACTOR: Rheumatoid fact SerPl-aCnc: 20 [IU]/mL — ABNORMAL HIGH (ref <14)

## 2023-01-20 ENCOUNTER — Other Ambulatory Visit: Payer: Self-pay | Admitting: Internal Medicine

## 2023-01-21 LAB — CYCLIC CITRUL PEPTIDE ANTIBODY, IGG: Cyclic Citrullin Peptide Ab: <16 U

## 2023-02-03 ENCOUNTER — Other Ambulatory Visit: Payer: Self-pay | Admitting: Internal Medicine

## 2023-02-06 ENCOUNTER — Encounter: Payer: Self-pay | Admitting: Gastroenterology

## 2023-02-06 ENCOUNTER — Encounter: Payer: Self-pay | Admitting: Internal Medicine

## 2023-02-07 ENCOUNTER — Encounter: Payer: Self-pay | Admitting: Internal Medicine

## 2023-02-10 ENCOUNTER — Other Ambulatory Visit: Payer: Self-pay | Admitting: Internal Medicine

## 2023-02-20 ENCOUNTER — Other Ambulatory Visit: Payer: Self-pay | Admitting: Internal Medicine

## 2023-02-28 ENCOUNTER — Encounter: Payer: Self-pay | Admitting: Internal Medicine

## 2023-02-28 ENCOUNTER — Other Ambulatory Visit: Payer: Self-pay | Admitting: Internal Medicine

## 2023-03-13 ENCOUNTER — Ambulatory Visit (INDEPENDENT_AMBULATORY_CARE_PROVIDER_SITE_OTHER): Payer: Medicare Other | Admitting: Podiatry

## 2023-03-13 VITALS — Ht 78.0 in | Wt 235.0 lb

## 2023-03-13 DIAGNOSIS — R52 Pain, unspecified: Secondary | ICD-10-CM

## 2023-03-13 DIAGNOSIS — L600 Ingrowing nail: Secondary | ICD-10-CM

## 2023-03-13 DIAGNOSIS — L603 Nail dystrophy: Secondary | ICD-10-CM

## 2023-03-13 NOTE — Patient Instructions (Signed)

## 2023-03-13 NOTE — Progress Notes (Signed)
Subjective: Chief Complaint  Patient presents with   Nail Problem    Nail on left second toe is curving/thickening. Hammer toe surgery was less than a year ago he believes.    67 year old male presents the office today with above concerns.  States left second toenail is painful is becoming thick he cannot trim himself.  No swelling redness or any drainage.  No open lesions.  Objective: AAO x3, NAD DP/PT pulses palpable bilaterally, CRT less than 3 seconds All the nails on the left foot have yellow discoloration.  The left second digit toenails hypertrophic, dystrophic and there is incurvation of the nail border under tenderness palpation of the entire toenail.  There is no edema, erythema, drainage or pus or any signs of infection noted today.  The incision for the prior surgery is well-healed. No pain with calf compression, swelling, warmth, erythema  Assessment: 67 year old male left second digit ingrown toenail, onychomycosis  Plan: -All treatment options discussed with the patient including all alternatives, risks, complications.  -We discussed different treatment options.  After discussion he agrees to proceed with total nail avulsion. -At this time, wants to proceed with total nail removal without chemical matricectomy to the left second digit toenail. Risks and complications were discussed with the patient for which they understand and  verbally consent to the procedure. Under sterile conditions a total of 3 mL of a mixture of 2% lidocaine plain and 0.5% Marcaine plain was infiltrated in a hallux block fashion. Once anesthetized, the skin was prepped in sterile fashion. A tourniquet was then applied.  Left second digit toenail was removed in total making sure remove all nail borders.  Once the nail was removed, the area was debrided and the underlying skin was intact. The area was irrigated and hemostasis was obtained.  A dry sterile dressing was applied. After application of the dressing  the tourniquet was removed and there is found to be an immediate capillary refill time to the digit. The patient tolerated the procedure well any complications. Post procedure instructions were discussed the patient for which he verbally understood. Follow-up in one week for nail check or sooner if any problems are to arise. Discussed signs/symptoms of worsening infection and directed to call the office immediately should any occur or go directly to the emergency room. In the meantime, encouraged to call the office with any questions, concerns, changes symptoms. -Nail sent for culture to [facility].  -Patient encouraged to call the office with any questions, concerns, change in symptoms.   Vivi Barrack DPM

## 2023-03-16 ENCOUNTER — Ambulatory Visit: Payer: Medicare Other | Admitting: Podiatry

## 2023-03-22 ENCOUNTER — Encounter: Payer: Self-pay | Admitting: Podiatry

## 2023-04-12 ENCOUNTER — Other Ambulatory Visit (HOSPITAL_COMMUNITY)
Admission: RE | Admit: 2023-04-12 | Discharge: 2023-04-12 | Disposition: A | Discharge status: Home / Self Care | Payer: Self-pay | Source: Ambulatory Visit | Attending: Oncology | Admitting: Oncology

## 2023-04-12 DIAGNOSIS — Z006 Encounter for examination for normal comparison and control in clinical research program: Secondary | ICD-10-CM | POA: Insufficient documentation

## 2023-04-24 ENCOUNTER — Ambulatory Visit: Payer: Medicare Other | Admitting: Internal Medicine

## 2023-04-24 ENCOUNTER — Encounter: Payer: Self-pay | Admitting: Internal Medicine

## 2023-04-24 VITALS — BP 158/88 | HR 91 | Temp 98.6°F | Ht 78.0 in | Wt 247.0 lb

## 2023-04-24 DIAGNOSIS — M545 Low back pain, unspecified: Secondary | ICD-10-CM | POA: Diagnosis not present

## 2023-04-24 DIAGNOSIS — I10 Essential (primary) hypertension: Secondary | ICD-10-CM

## 2023-04-24 DIAGNOSIS — M533 Sacrococcygeal disorders, not elsewhere classified: Secondary | ICD-10-CM

## 2023-04-24 DIAGNOSIS — E291 Testicular hypofunction: Secondary | ICD-10-CM

## 2023-04-24 DIAGNOSIS — F909 Attention-deficit hyperactivity disorder, unspecified type: Secondary | ICD-10-CM

## 2023-04-24 DIAGNOSIS — D751 Secondary polycythemia: Secondary | ICD-10-CM

## 2023-04-24 DIAGNOSIS — R3915 Urgency of urination: Secondary | ICD-10-CM | POA: Diagnosis not present

## 2023-04-24 DIAGNOSIS — N320 Bladder-neck obstruction: Secondary | ICD-10-CM

## 2023-04-24 DIAGNOSIS — M25552 Pain in left hip: Secondary | ICD-10-CM

## 2023-04-24 DIAGNOSIS — M25551 Pain in right hip: Secondary | ICD-10-CM

## 2023-04-24 DIAGNOSIS — F330 Major depressive disorder, recurrent, mild: Secondary | ICD-10-CM

## 2023-04-24 DIAGNOSIS — G8929 Other chronic pain: Secondary | ICD-10-CM | POA: Diagnosis not present

## 2023-04-24 DIAGNOSIS — E785 Hyperlipidemia, unspecified: Secondary | ICD-10-CM

## 2023-04-24 LAB — CBC WITH DIFFERENTIAL/PLATELET
Basophils Absolute: 0.1 10*3/uL (ref 0.0–0.1)
Basophils Relative: 0.6 % (ref 0.0–3.0)
Eosinophils Absolute: 0 10*3/uL (ref 0.0–0.7)
Eosinophils Relative: 0.3 % (ref 0.0–5.0)
HCT: 51.8 % (ref 39.0–52.0)
Hemoglobin: 17.4 g/dL — ABNORMAL HIGH (ref 13.0–17.0)
Lymphocytes Relative: 12.9 % (ref 12.0–46.0)
Lymphs Abs: 1.4 10*3/uL (ref 0.7–4.0)
MCHC: 33.7 g/dL (ref 30.0–36.0)
MCV: 96.5 fL (ref 78.0–100.0)
Monocytes Absolute: 0.9 10*3/uL (ref 0.1–1.0)
Monocytes Relative: 8.6 % (ref 3.0–12.0)
Neutro Abs: 8.5 10*3/uL — ABNORMAL HIGH (ref 1.4–7.7)
Neutrophils Relative %: 77.6 % — ABNORMAL HIGH (ref 43.0–77.0)
Platelets: 349 10*3/uL (ref 150.0–400.0)
RBC: 5.36 Mil/uL (ref 4.22–5.81)
RDW: 13.7 % (ref 11.5–15.5)
WBC: 10.9 10*3/uL — ABNORMAL HIGH (ref 4.0–10.5)

## 2023-04-24 LAB — COMPREHENSIVE METABOLIC PANEL
ALT: 34 U/L (ref 0–53)
AST: 23 U/L (ref 0–37)
Albumin: 4.3 g/dL (ref 3.5–5.2)
Alkaline Phosphatase: 69 U/L (ref 39–117)
BUN: 17 mg/dL (ref 6–23)
CO2: 30 meq/L (ref 19–32)
Calcium: 9.2 mg/dL (ref 8.4–10.5)
Chloride: 101 meq/L (ref 96–112)
Creatinine, Ser: 0.98 mg/dL (ref 0.40–1.50)
GFR: 78.63 mL/min (ref >60.00)
Glucose, Bld: 96 mg/dL (ref 70–99)
Potassium: 3.8 meq/L (ref 3.5–5.1)
Sodium: 139 meq/L (ref 135–145)
Total Bilirubin: 0.9 mg/dL (ref 0.2–1.2)
Total Protein: 6.9 g/dL (ref 6.0–8.3)

## 2023-04-24 LAB — TESTOSTERONE: Testosterone: 791.84 ng/dL (ref 300.00–890.00)

## 2023-04-24 LAB — GENECONNECT MOLECULAR SCREEN: Genetic Analysis Overall Interpretation: NEGATIVE

## 2023-04-24 LAB — TSH: TSH: 0.69 u[IU]/mL (ref 0.35–5.50)

## 2023-04-24 MED ORDER — SERTRALINE HCL 100 MG PO TABS: 100.0000 mg | ORAL_TABLET | Freq: Every day | ORAL | 3 refills | Status: DC

## 2023-04-24 MED ORDER — AMPHETAMINE-DEXTROAMPHETAMINE 10 MG PO TABS: 10.0000 mg | ORAL_TABLET | Freq: Two times a day (BID) | ORAL | 0 refills | Status: DC

## 2023-04-24 MED ORDER — AMPHETAMINE-DEXTROAMPHETAMINE 10 MG PO TABS
10.0000 mg | ORAL_TABLET | Freq: Two times a day (BID) | ORAL | 0 refills | Status: DC
Start: 2023-04-24 — End: 2023-07-24

## 2023-04-24 NOTE — Assessment & Plan Note (Signed)
  Doing well s/p Rezume  procedure

## 2023-04-24 NOTE — Assessment & Plan Note (Signed)
Check CBC 

## 2023-04-24 NOTE — Assessment & Plan Note (Signed)
Continue on Adderall.   Potential benefits of a long term stimulants use as well as potential risks  and complications were explained to the patient and were aknowledged.

## 2023-04-24 NOTE — Assessment & Plan Note (Signed)
BP Readings from Last 3 Encounters:  04/24/23 (!) 158/88  12/26/22 118/70  09/26/22 (!) 142/94

## 2023-04-24 NOTE — Assessment & Plan Note (Signed)
Cardiac CT calcium score is 0 

## 2023-04-24 NOTE — Assessment & Plan Note (Signed)
Cont on Flexeril, Nabumetone Rx prn Ortho ref  Labs - HLA B27

## 2023-04-24 NOTE — Assessment & Plan Note (Signed)
Doing well s/p Rezume  procedure

## 2023-04-24 NOTE — Progress Notes (Signed)
Subjective:  Patient ID: Victor Montes, male    DOB: 07-Mar-1956  Age: 67 y.o. MRN: [id number]  CC: Medical Management of Chronic Issues (3 month follow up )   HPI Victor Montes presents for ADD, hypogonadism, depression  Outpatient Medications Prior to Visit  Medication Sig Dispense Refill   aspirin 325 MG tablet Take 325 mg by mouth daily.     b complex vitamins tablet Take 1 tablet by mouth daily.     cetirizine (ZYRTEC) 10 MG tablet Take 10 mg by mouth daily.     guaiFENesin (MUCINEX) 600 MG 12 hr tablet Take 600 mg by mouth daily with breakfast.     KRILL OIL PO Take by mouth.     MAGNESIUM PO Take 1 tablet by mouth daily.     montelukast (SINGULAIR) 10 MG tablet Take 1 tablet by mouth daily. 90 tablet 3   Multiple Vitamin (MULTIVITAMIN WITH MINERALS) TABS tablet Take 1 tablet by mouth daily.     nabumetone (RELAFEN) 750 MG tablet Take 1 tablet by mouth twice daily as needed for moderate pain. 180 tablet 0   SYRINGE-NEEDLE, DISP, 3 ML (BD ECLIPSE SYRINGE) 25G X 1" 3 ML MISC USE AS DIRECTED FOR IM INJECTION 50 each 11   tadalafil (CIALIS) 5 MG tablet Take 1 tablet by mouth daily. 90 tablet 3   testosterone cypionate (DEPOTESTOSTERONE CYPIONATE) 200 MG/ML injection Inject 2 ml into the muscle every 2 weeks. 12 mL 1   amphetamine-dextroamphetamine (ADDERALL) 10 MG tablet Take 1 tablet (10 mg total) by mouth 2 (two) times daily. 60 tablet 0   sertraline (ZOLOFT) 50 MG tablet Take 1 tablet by mouth daily. 90 tablet 1   amphetamine-dextroamphetamine (ADDERALL) 10 MG tablet Take 1 tablet (10 mg total) by mouth 2 (two) times daily. 60 tablet 0   amphetamine-dextroamphetamine (ADDERALL) 10 MG tablet Take 1 tablet (10 mg total) by mouth 2 (two) times daily. 60 tablet 0   cyclobenzaprine (FLEXERIL) 10 MG tablet Take 1 tablet by mouth three times daily as needed for muscle spasms. 180 tablet 0   No facility-administered medications prior to visit.    ROS: Review of Systems   Constitutional:  Positive for fatigue and unexpected weight change. Negative for appetite change.  HENT:  Negative for congestion, nosebleeds, sneezing, sore throat and trouble swallowing.   Eyes:  Negative for itching and visual disturbance.  Respiratory:  Negative for cough.   Cardiovascular:  Negative for chest pain, palpitations and leg swelling.  Gastrointestinal:  Negative for abdominal distention, blood in stool, diarrhea and nausea.  Genitourinary:  Negative for frequency and hematuria.  Musculoskeletal:  Positive for arthralgias and back pain. Negative for gait problem, joint swelling and neck pain.  Skin:  Negative for rash.  Neurological:  Negative for dizziness, tremors, speech difficulty and weakness.  Psychiatric/Behavioral:  Positive for sleep disturbance. Negative for agitation, dysphoric mood and suicidal ideas. The patient is nervous/anxious.     Objective:  BP (!) 158/88 (BP Location: Left Arm, Patient Position: Sitting, Cuff Size: Normal)   Pulse 91   Temp 98.6 F (37 C) (Oral)   Ht 6\' 6"  (1.981 m)   Wt 247 lb (112 kg)   SpO2 98%   BMI 28.54 kg/m   BP Readings from Last 3 Encounters:  04/24/23 (!) 158/88  12/26/22 118/70  09/26/22 (!) 142/94    Wt Readings from Last 3 Encounters:  04/24/23 247 lb (112 kg)  03/13/23 235 lb (106.6 kg)  01/12/23 239  lb (108.4 kg)    Physical Exam Constitutional:      General: He is not in acute distress.    Appearance: He is well-developed. He is obese.     Comments: NAD  Eyes:     Conjunctiva/sclera: Conjunctivae normal.     Pupils: Pupils are equal, round, and reactive to light.  Neck:     Thyroid: No thyromegaly.     Vascular: No JVD.  Cardiovascular:     Rate and Rhythm: Normal rate and regular rhythm.     Heart sounds: Normal heart sounds. No murmur heard.    No friction rub. No gallop.  Pulmonary:     Effort: Pulmonary effort is normal. No respiratory distress.     Breath sounds: Normal breath sounds. No  wheezing or rales.  Chest:     Chest wall: No tenderness.  Abdominal:     General: Bowel sounds are normal. There is no distension.     Palpations: Abdomen is soft. There is no mass.     Tenderness: There is no abdominal tenderness. There is no guarding or rebound.  Musculoskeletal:        General: Tenderness present. Normal range of motion.     Cervical back: Normal range of motion.     Right lower leg: No edema.     Left lower leg: No edema.  Lymphadenopathy:     Cervical: No cervical adenopathy.  Skin:    General: Skin is warm and dry.     Findings: No rash.  Neurological:     Mental Status: He is alert and oriented to person, place, and time.     Cranial Nerves: No cranial nerve deficit.     Motor: No abnormal muscle tone.     Coordination: Coordination normal.     Gait: Gait normal.     Deep Tendon Reflexes: Reflexes are normal and symmetric.  Psychiatric:        Behavior: Behavior normal.        Thought Content: Thought content normal.        Judgment: Judgment normal.   LS w/pain  Lab Results  Component Value Date   WBC 10.9 (H) 04/24/2023   HGB 17.4 (H) 04/24/2023   HCT 51.8 04/24/2023   PLT 349.0 04/24/2023   GLUCOSE 96 04/24/2023   CHOL 180 12/18/2022   TRIG 159.0 (H) 12/18/2022   HDL 48.50 12/18/2022   LDLDIRECT 137.0 03/27/2022   LDLCALC 100 (H) 12/18/2022   ALT 34 04/24/2023   AST 23 04/24/2023   NA 139 04/24/2023   K 3.8 04/24/2023   CL 101 04/24/2023   CREATININE 0.98 04/24/2023   BUN 17 04/24/2023   CO2 30 04/24/2023   TSH 0.69 04/24/2023   PSA 2.49 12/18/2022   HGBA1C 5.6 07/11/2017    No results found.  Assessment & Plan:   Problem List Items Addressed This Visit     Dyslipidemia   Cardiac CT calcium score is 0      Relevant Orders   Comprehensive metabolic panel (Completed)   TSH (Completed)   Attention deficit disorder - Primary   Continue on Adderall.   Potential benefits of a long term stimulants use as well as potential  risks  and complications were explained to the patient and were aknowledged.      Urinary urgency    Doing well s/p Rezume  procedure      Hypertension   BP Readings from Last 3 Encounters:  04/24/23 (!) 158/88  12/26/22 118/70  09/26/22 (!) 142/94         Relevant Orders   CBC with Differential/Platelet (Completed)   Comprehensive metabolic panel (Completed)   TSH (Completed)   Testosterone (Completed)   Depression   Worse - seasonal Increase Zoloft to 100 mg/d Counseling suggested       Relevant Medications   sertraline (ZOLOFT) 100 MG tablet   Low back pain   Cont on Flexeril, Nabumetone Rx prn Ortho ref  Labs - HLA B27       Bladder neck obstruction   Doing well s/p Rezume  procedure      Hip pain   Relevant Medications   amphetamine-dextroamphetamine (ADDERALL) 10 MG tablet   Chronic SI joint pain   Relevant Medications   amphetamine-dextroamphetamine (ADDERALL) 10 MG tablet   sertraline (ZOLOFT) 100 MG tablet   Polycythemia   Check CBC      Other Visit Diagnoses       Hypogonadism male       Relevant Orders   Testosterone (Completed)         Meds ordered this encounter  Medications   amphetamine-dextroamphetamine (ADDERALL) 10 MG tablet    Sig: Take 1 tablet (10 mg total) by mouth 2 (two) times daily.    Dispense:  60 tablet    Refill:  0    Please fill on or after 03/23/23   amphetamine-dextroamphetamine (ADDERALL) 10 MG tablet    Sig: Take 1 tablet (10 mg total) by mouth 2 (two) times daily.    Dispense:  60 tablet    Refill:  0    Please fill on or after 04/22/03/24   amphetamine-dextroamphetamine (ADDERALL) 10 MG tablet    Sig: Take 1 tablet (10 mg total) by mouth 2 (two) times daily.    Dispense:  60 tablet    Refill:  0    Please fill on or after 05/22/23   sertraline (ZOLOFT) 100 MG tablet    Sig: Take 1 tablet (100 mg total) by mouth daily.    Dispense:  90 tablet    Refill:  3      Follow-up: Return in about 3  months (around 07/23/2023) for a follow-up visit.  Sonda Primes, MD

## 2023-04-24 NOTE — Assessment & Plan Note (Addendum)
Worse - seasonal Increase Zoloft to 100 mg/d Counseling suggested

## 2023-05-18 ENCOUNTER — Other Ambulatory Visit: Payer: Self-pay | Admitting: Internal Medicine

## 2023-05-23 ENCOUNTER — Other Ambulatory Visit: Payer: Self-pay | Admitting: Internal Medicine

## 2023-06-06 ENCOUNTER — Other Ambulatory Visit: Payer: Self-pay | Admitting: Internal Medicine

## 2023-06-13 ENCOUNTER — Telehealth: Payer: Self-pay | Admitting: Internal Medicine

## 2023-06-13 NOTE — Telephone Encounter (Signed)
Copied from CRM [id number]. Topic: Clinical - Prescription Issue >> Jun 13, 2023  2:18 PM Elizebeth Brooking wrote: Reason for CRM: [facility] called in regarding an early fill request on prescription  testosterone cypionate (DEPOTESTOSTERONE CYPIONATE) 200 MG/ML injection Stated that the shipment was sent on the 15th and ups tracking stated it was delivered on the 17 th but patient stated they never received it so [name] will need a approval for early fill request

## 2023-06-15 ENCOUNTER — Encounter: Payer: Self-pay | Admitting: Internal Medicine

## 2023-06-17 ENCOUNTER — Other Ambulatory Visit: Payer: Self-pay | Admitting: Internal Medicine

## 2023-06-17 MED ORDER — TESTOSTERONE CYPIONATE 200 MG/ML IM SOLN: INTRAMUSCULAR | 1 refills | Status: DC

## 2023-06-18 NOTE — Telephone Encounter (Signed)
I think I took care of it.  See other messages.  Thank you

## 2023-07-24 ENCOUNTER — Encounter: Payer: Self-pay | Admitting: Internal Medicine

## 2023-07-24 ENCOUNTER — Ambulatory Visit (INDEPENDENT_AMBULATORY_CARE_PROVIDER_SITE_OTHER): Payer: Medicare Other | Admitting: Internal Medicine

## 2023-07-24 VITALS — BP 122/80 | HR 89 | Temp 99.5°F | Ht 78.0 in | Wt 247.0 lb

## 2023-07-24 DIAGNOSIS — M25552 Pain in left hip: Secondary | ICD-10-CM | POA: Diagnosis not present

## 2023-07-24 DIAGNOSIS — F909 Attention-deficit hyperactivity disorder, unspecified type: Secondary | ICD-10-CM | POA: Diagnosis not present

## 2023-07-24 DIAGNOSIS — M25551 Pain in right hip: Secondary | ICD-10-CM | POA: Diagnosis not present

## 2023-07-24 DIAGNOSIS — E291 Testicular hypofunction: Secondary | ICD-10-CM

## 2023-07-24 DIAGNOSIS — M533 Sacrococcygeal disorders, not elsewhere classified: Secondary | ICD-10-CM

## 2023-07-24 DIAGNOSIS — G8929 Other chronic pain: Secondary | ICD-10-CM

## 2023-07-24 MED ORDER — AMPHETAMINE-DEXTROAMPHETAMINE 10 MG PO TABS: 10.0000 mg | ORAL_TABLET | Freq: Two times a day (BID) | ORAL | 0 refills | Status: DC

## 2023-07-24 NOTE — Progress Notes (Signed)
 Subjective:  Patient ID: Victor Montes, male    DOB: 07-Aug-1955  Age: 68 y.o. MRN: [id number]  CC: Medical Management of Chronic Issues (3 mnth f/u)   HPI Victor Montes presents for ADD, hypogonadism  Outpatient Medications Prior to Visit  Medication Sig Dispense Refill   aspirin 325 MG tablet Take 325 mg by mouth daily.     b complex vitamins tablet Take 1 tablet by mouth daily.     cetirizine (ZYRTEC) 10 MG tablet Take 10 mg by mouth daily.     guaiFENesin (MUCINEX) 600 MG 12 hr tablet Take 600 mg by mouth daily with breakfast.     KRILL OIL PO Take by mouth.     MAGNESIUM PO Take 1 tablet by mouth daily.     montelukast (SINGULAIR) 10 MG tablet Take 1 tablet by mouth daily. 90 tablet 2   Multiple Vitamin (MULTIVITAMIN WITH MINERALS) TABS tablet Take 1 tablet by mouth daily.     nabumetone (RELAFEN) 750 MG tablet Take 1 tablet by mouth twice daily as needed for moderate pain. 180 tablet 0   sertraline (ZOLOFT) 100 MG tablet Take 1 tablet (100 mg total) by mouth daily. 90 tablet 3   SYRINGE-NEEDLE, DISP, 3 ML (BD ECLIPSE SYRINGE) 25G X 1" 3 ML MISC USE AS DIRECTED FOR IM INJECTION 50 each 11   tadalafil (CIALIS) 5 MG tablet Take 1 tablet by mouth daily. 90 tablet 2   testosterone cypionate (DEPOTESTOSTERONE CYPIONATE) 200 MG/ML injection Inject 2 ml into the muscle every 2 weeks. 12 mL 1   amphetamine-dextroamphetamine (ADDERALL) 10 MG tablet Take 1 tablet (10 mg total) by mouth 2 (two) times daily. 60 tablet 0   amphetamine-dextroamphetamine (ADDERALL) 10 MG tablet Take 1 tablet (10 mg total) by mouth 2 (two) times daily. 60 tablet 0   amphetamine-dextroamphetamine (ADDERALL) 10 MG tablet Take 1 tablet (10 mg total) by mouth 2 (two) times daily. 60 tablet 0   No facility-administered medications prior to visit.    ROS: Review of Systems  Constitutional:  Negative for appetite change, fatigue and unexpected weight change.  HENT:  Negative for congestion, nosebleeds, sneezing,  sore throat and trouble swallowing.   Eyes:  Negative for itching and visual disturbance.  Respiratory:  Negative for cough.   Cardiovascular:  Negative for chest pain, palpitations and leg swelling.  Gastrointestinal:  Negative for abdominal distention, blood in stool, diarrhea and nausea.  Genitourinary:  Negative for frequency and hematuria.  Musculoskeletal:  Negative for back pain, gait problem, joint swelling and neck pain.  Skin:  Negative for rash.  Neurological:  Negative for dizziness, tremors, speech difficulty and weakness.  Psychiatric/Behavioral:  Negative for agitation, dysphoric mood and sleep disturbance. The patient is not nervous/anxious.     Objective:  BP 122/80   Pulse 89   Temp 99.5 F (37.5 C) (Oral)   Ht 6\' 6"  (1.981 m)   Wt 247 lb (112 kg)   SpO2 95%   BMI 28.54 kg/m   BP Readings from Last 3 Encounters:  07/24/23 122/80  04/24/23 (!) 158/88  12/26/22 118/70    Wt Readings from Last 3 Encounters:  07/24/23 247 lb (112 kg)  04/24/23 247 lb (112 kg)  03/13/23 235 lb (106.6 kg)    Physical Exam  Lab Results  Component Value Date   WBC 10.9 (H) 04/24/2023   HGB 17.4 (H) 04/24/2023   HCT 51.8 04/24/2023   PLT 349.0 04/24/2023   GLUCOSE 96 04/24/2023  CHOL 180 12/18/2022   TRIG 159.0 (H) 12/18/2022   HDL 48.50 12/18/2022   LDLDIRECT 137.0 03/27/2022   LDLCALC 100 (H) 12/18/2022   ALT 34 04/24/2023   AST 23 04/24/2023   NA 139 04/24/2023   K 3.8 04/24/2023   CL 101 04/24/2023   CREATININE 0.98 04/24/2023   BUN 17 04/24/2023   CO2 30 04/24/2023   TSH 0.69 04/24/2023   PSA 2.49 12/18/2022   HGBA1C 5.6 07/11/2017    No results found.  Assessment & Plan:   Problem List Items Addressed This Visit     Hypogonadism in male   Donating blood - next one is in April      Attention deficit disorder - Primary   Continue on Adderall.   Potential benefits of a long term stimulants use as well as potential risks  and complications were  explained to the patient and were aknowledged.      Hip pain   B OA F/u w/Pain management.  He may need another injection in the hip.  Obtain lab work  S/p steroid shot x1      Relevant Medications   amphetamine-dextroamphetamine (ADDERALL) 10 MG tablet   Chronic SI joint pain   Relevant Medications   amphetamine-dextroamphetamine (ADDERALL) 10 MG tablet      Meds ordered this encounter  Medications   amphetamine-dextroamphetamine (ADDERALL) 10 MG tablet    Sig: Take 1 tablet (10 mg total) by mouth 2 (two) times daily.    Dispense:  60 tablet    Refill:  0    Please fill on or after 10/22/2023   amphetamine-dextroamphetamine (ADDERALL) 10 MG tablet    Sig: Take 1 tablet (10 mg total) by mouth 2 (two) times daily.    Dispense:  60 tablet    Refill:  0    Please fill on or after 08/23/2023   amphetamine-dextroamphetamine (ADDERALL) 10 MG tablet    Sig: Take 1 tablet (10 mg total) by mouth 2 (two) times daily.    Dispense:  60 tablet    Refill:  0    Please fill on or after 09/22/2023      Follow-up: Return in about 3 months (around 10/24/2023) for a follow-up visit.  Sonda Primes, MD

## 2023-07-24 NOTE — Assessment & Plan Note (Signed)
B OA F/u w/Pain management.  He may need another injection in the hip.  Obtain lab work  S/p steroid shot x1

## 2023-07-24 NOTE — Assessment & Plan Note (Signed)
Continue on Adderall.   Potential benefits of a long term stimulants use as well as potential risks  and complications were explained to the patient and were aknowledged.

## 2023-07-24 NOTE — Assessment & Plan Note (Signed)
 Donating blood - next one is in April

## 2023-08-17 ENCOUNTER — Other Ambulatory Visit: Payer: Self-pay | Admitting: Internal Medicine

## 2023-09-11 ENCOUNTER — Other Ambulatory Visit: Payer: Self-pay | Admitting: Internal Medicine

## 2023-09-21 ENCOUNTER — Encounter: Payer: Self-pay | Admitting: Internal Medicine

## 2023-09-25 ENCOUNTER — Other Ambulatory Visit: Payer: Self-pay | Admitting: Internal Medicine

## 2023-09-25 MED ORDER — SERTRALINE HCL 100 MG PO TABS: 100.0000 mg | ORAL_TABLET | Freq: Every day | ORAL | 3 refills | Status: AC

## 2023-10-23 ENCOUNTER — Ambulatory Visit: Admitting: Internal Medicine

## 2023-10-23 ENCOUNTER — Ambulatory Visit (INDEPENDENT_AMBULATORY_CARE_PROVIDER_SITE_OTHER)

## 2023-10-23 ENCOUNTER — Encounter: Payer: Self-pay | Admitting: Internal Medicine

## 2023-10-23 VITALS — BP 118/70 | HR 96 | Temp 98.3°F | Ht 78.0 in | Wt 253.0 lb

## 2023-10-23 DIAGNOSIS — M25551 Pain in right hip: Secondary | ICD-10-CM | POA: Diagnosis not present

## 2023-10-23 DIAGNOSIS — G8929 Other chronic pain: Secondary | ICD-10-CM

## 2023-10-23 DIAGNOSIS — M25552 Pain in left hip: Secondary | ICD-10-CM

## 2023-10-23 DIAGNOSIS — E785 Hyperlipidemia, unspecified: Secondary | ICD-10-CM | POA: Diagnosis not present

## 2023-10-23 DIAGNOSIS — M542 Cervicalgia: Secondary | ICD-10-CM

## 2023-10-23 DIAGNOSIS — R972 Elevated prostate specific antigen [PSA]: Secondary | ICD-10-CM

## 2023-10-23 DIAGNOSIS — M545 Low back pain, unspecified: Secondary | ICD-10-CM | POA: Diagnosis not present

## 2023-10-23 DIAGNOSIS — M533 Sacrococcygeal disorders, not elsewhere classified: Secondary | ICD-10-CM

## 2023-10-23 DIAGNOSIS — E291 Testicular hypofunction: Secondary | ICD-10-CM

## 2023-10-23 MED ORDER — AMPHETAMINE-DEXTROAMPHETAMINE 10 MG PO TABS: 10.0000 mg | ORAL_TABLET | Freq: Two times a day (BID) | ORAL | 0 refills | Status: DC

## 2023-10-23 MED ORDER — PREDNISONE 10 MG PO TABS: ORAL_TABLET | ORAL | 1 refills | Status: AC

## 2023-10-23 NOTE — Progress Notes (Signed)
 Subjective:  Patient ID: Victor Montes, male    DOB: [birth year]  Age: 68 y.o. MRN: [id number]  CC: Medical Management of Chronic Issues   HPI Dyshon Philbin presents for R shoulder and upper arm pain at night and itching on the R biceps x 1 month Pt fell - LBP and B leg pain  Outpatient Medications Prior to Visit  Medication Sig Dispense Refill   aspirin 325 MG tablet Take 325 mg by mouth daily.     b complex vitamins tablet Take 1 tablet by mouth daily.     cetirizine (ZYRTEC) 10 MG tablet Take 10 mg by mouth daily.     KRILL OIL PO Take by mouth.     MAGNESIUM PO Take 1 tablet by mouth daily.     montelukast  (SINGULAIR ) 10 MG tablet Take 1 tablet by mouth daily. 90 tablet 2   Multiple Vitamin (MULTIVITAMIN WITH MINERALS) TABS tablet Take 1 tablet by mouth daily.     nabumetone  (RELAFEN ) 750 MG tablet Take 1 tablet by mouth twice daily as needed for moderate pain. 180 tablet 0   sertraline  (ZOLOFT ) 100 MG tablet Take 1 tablet (100 mg total) by mouth daily. 90 tablet 3   SYRINGE-NEEDLE, DISP, 3 ML (BD ECLIPSE SYRINGE) 25G X 1" 3 ML MISC USE AS DIRECTED FOR IM INJECTION 50 each 11   tadalafil  (CIALIS ) 5 MG tablet Take 1 tablet by mouth daily. 90 tablet 2   testosterone  cypionate (DEPOTESTOSTERONE CYPIONATE) 200 MG/ML injection Inject 2 ml into the muscle every 2 weeks. 12 mL 1   amphetamine -dextroamphetamine  (ADDERALL) 10 MG tablet Take 1 tablet (10 mg total) by mouth 2 (two) times daily. 60 tablet 0   amphetamine -dextroamphetamine  (ADDERALL) 10 MG tablet Take 1 tablet (10 mg total) by mouth 2 (two) times daily. 60 tablet 0   amphetamine -dextroamphetamine  (ADDERALL) 10 MG tablet Take 1 tablet (10 mg total) by mouth 2 (two) times daily. 60 tablet 0   guaiFENesin (MUCINEX) 600 MG 12 hr tablet Take 600 mg by mouth daily with breakfast.     No facility-administered medications prior to visit.    ROS: Review of Systems  Constitutional:  Negative for appetite change, fatigue and  unexpected weight change.  HENT:  Negative for congestion, nosebleeds, sneezing, sore throat and trouble swallowing.   Eyes:  Negative for itching and visual disturbance.  Respiratory:  Negative for cough.   Cardiovascular:  Negative for chest pain, palpitations and leg swelling.  Gastrointestinal:  Negative for abdominal distention, blood in stool, diarrhea and nausea.  Genitourinary:  Negative for frequency and hematuria.  Musculoskeletal:  Positive for back pain, gait problem and neck pain. Negative for joint swelling.  Skin:  Negative for rash.  Neurological:  Negative for dizziness, tremors, speech difficulty and weakness.  Psychiatric/Behavioral:  Negative for agitation, dysphoric mood and sleep disturbance. The patient is not nervous/anxious.     Objective:  BP 118/70   Pulse 96   Temp 98.3 F (36.8 C) (Oral)   Ht 6\' 6"  (1.981 m)   Wt 253 lb (114.8 kg)   SpO2 96%   BMI 29.24 kg/m   BP Readings from Last 3 Encounters:  10/23/23 118/70  07/24/23 122/80  04/24/23 (!) 158/88    Wt Readings from Last 3 Encounters:  10/23/23 253 lb (114.8 kg)  07/24/23 247 lb (112 kg)  04/24/23 247 lb (112 kg)    Physical Exam Constitutional:      General: He is not in acute distress.  Appearance: He is well-developed. He is obese.     Comments: NAD  Eyes:     Conjunctiva/sclera: Conjunctivae normal.     Pupils: Pupils are equal, round, and reactive to light.  Neck:     Thyroid : No thyromegaly.     Vascular: No JVD.  Cardiovascular:     Rate and Rhythm: Normal rate and regular rhythm.     Heart sounds: Normal heart sounds. No murmur heard.    No friction rub. No gallop.  Pulmonary:     Effort: Pulmonary effort is normal. No respiratory distress.     Breath sounds: Normal breath sounds. No wheezing or rales.  Chest:     Chest wall: No tenderness.  Abdominal:     General: Bowel sounds are normal. There is no distension.     Palpations: Abdomen is soft. There is no mass.      Tenderness: There is no abdominal tenderness. There is no guarding or rebound.  Musculoskeletal:        General: No tenderness. Normal range of motion.     Cervical back: Normal range of motion.  Lymphadenopathy:     Cervical: No cervical adenopathy.  Skin:    General: Skin is warm and dry.     Findings: No rash.  Neurological:     Mental Status: He is alert and oriented to person, place, and time.     Cranial Nerves: No cranial nerve deficit.     Motor: No abnormal muscle tone.     Coordination: Coordination normal.     Gait: Gait normal.     Deep Tendon Reflexes: Reflexes are normal and symmetric.  Psychiatric:        Behavior: Behavior normal.        Thought Content: Thought content normal.        Judgment: Judgment normal.     Neck tilting back caused pain  LS w/pain   Lab Results  Component Value Date   WBC 10.9 (H) 04/24/2023   HGB 17.4 (H) 04/24/2023   HCT 51.8 04/24/2023   PLT 349.0 04/24/2023   GLUCOSE 96 04/24/2023   CHOL 180 12/18/2022   TRIG 159.0 (H) 12/18/2022   HDL 48.50 12/18/2022   LDLDIRECT 137.0 03/27/2022   LDLCALC 100 (H) 12/18/2022   ALT 34 04/24/2023   AST 23 04/24/2023   NA 139 04/24/2023   K 3.8 04/24/2023   CL 101 04/24/2023   CREATININE 0.98 04/24/2023   BUN 17 04/24/2023   CO2 30 04/24/2023   TSH 0.69 04/24/2023   PSA 2.49 12/18/2022   HGBA1C 5.6 07/11/2017    No results found.  Assessment & Plan:   Problem List Items Addressed This Visit     Hypogonadism in male   Relevant Orders   TSH   Urinalysis   Comprehensive metabolic panel with GFR   Testosterone    Dyslipidemia   Relevant Orders   CBC with Differential/Platelet   Lipid panel   PSA   Cervical pain (neck) - Primary   X ray Steroids po       Relevant Orders   DG Cervical Spine Complete   Low back pain   Cont on Flexeril , Nabumetone  Rx prn Prednisone  taper X ray      Relevant Medications   predniSONE  (DELTASONE ) 10 MG tablet   Other Relevant Orders    DG Lumbar Spine 2-3 Views   Hip pain   Relevant Medications   amphetamine -dextroamphetamine  (ADDERALL) 10 MG tablet   Other Relevant  Orders   DG Cervical Spine Complete   TSH   Chronic SI joint pain   Relevant Medications   amphetamine -dextroamphetamine  (ADDERALL) 10 MG tablet   predniSONE  (DELTASONE ) 10 MG tablet   Other Relevant Orders   Lipid panel   PSA   Other Visit Diagnoses       Elevated PSA       Relevant Orders   PSA         Meds ordered this encounter  Medications   amphetamine -dextroamphetamine  (ADDERALL) 10 MG tablet    Sig: Take 1 tablet (10 mg total) by mouth 2 (two) times daily.    Dispense:  60 tablet    Refill:  0    Please fill on or after 01/20/2024   amphetamine -dextroamphetamine  (ADDERALL) 10 MG tablet    Sig: Take 1 tablet (10 mg total) by mouth 2 (two) times daily.    Dispense:  60 tablet    Refill:  0    Please fill on or after 11/21/2023   amphetamine -dextroamphetamine  (ADDERALL) 10 MG tablet    Sig: Take 1 tablet (10 mg total) by mouth 2 (two) times daily.    Dispense:  60 tablet    Refill:  0    Please fill on or after 12/21/2023   predniSONE  (DELTASONE ) 10 MG tablet    Sig: Prednisone  10 mg: take 4 tabs a day x 3 days; then 3 tabs a day x 4 days; then 2 tabs a day x 4 days, then 1 tab a day x 6 days, then stop. Take pc.    Dispense:  38 tablet    Refill:  1      Follow-up: No follow-ups on file.  Anitra Barn, MD

## 2023-10-23 NOTE — Assessment & Plan Note (Signed)
 Cont on Flexeril , Nabumetone  Rx prn Prednisone  taper X ray

## 2023-10-23 NOTE — Assessment & Plan Note (Addendum)
 X ray Steroids po

## 2023-10-31 ENCOUNTER — Ambulatory Visit: Payer: Self-pay | Admitting: Internal Medicine

## 2023-11-06 ENCOUNTER — Other Ambulatory Visit: Payer: Self-pay | Admitting: Internal Medicine

## 2023-11-06 MED ORDER — CYCLOBENZAPRINE HCL 10 MG PO TABS: 10.0000 mg | ORAL_TABLET | Freq: Three times a day (TID) | ORAL | 1 refills | Status: DC | PRN

## 2023-11-15 ENCOUNTER — Other Ambulatory Visit: Payer: Self-pay | Admitting: Internal Medicine

## 2023-11-18 ENCOUNTER — Other Ambulatory Visit: Payer: Self-pay | Admitting: Internal Medicine

## 2023-12-06 ENCOUNTER — Ambulatory Visit: Admitting: Podiatry

## 2023-12-13 ENCOUNTER — Ambulatory Visit: Admitting: Podiatry

## 2023-12-28 ENCOUNTER — Encounter: Payer: Self-pay | Admitting: Internal Medicine

## 2024-01-03 ENCOUNTER — Other Ambulatory Visit (INDEPENDENT_AMBULATORY_CARE_PROVIDER_SITE_OTHER)

## 2024-01-03 DIAGNOSIS — M25551 Pain in right hip: Secondary | ICD-10-CM | POA: Diagnosis not present

## 2024-01-03 DIAGNOSIS — E785 Hyperlipidemia, unspecified: Secondary | ICD-10-CM

## 2024-01-03 DIAGNOSIS — R972 Elevated prostate specific antigen [PSA]: Secondary | ICD-10-CM

## 2024-01-03 DIAGNOSIS — E291 Testicular hypofunction: Secondary | ICD-10-CM | POA: Diagnosis not present

## 2024-01-03 DIAGNOSIS — M25552 Pain in left hip: Secondary | ICD-10-CM | POA: Diagnosis not present

## 2024-01-03 DIAGNOSIS — G8929 Other chronic pain: Secondary | ICD-10-CM

## 2024-01-03 LAB — PSA: PSA: 2.82 ng/mL (ref 0.10–4.00)

## 2024-01-03 LAB — COMPREHENSIVE METABOLIC PANEL WITH GFR
ALT: 28 U/L (ref 0–53)
AST: 22 U/L (ref 0–37)
Albumin: 4.9 g/dL (ref 3.5–5.2)
Alkaline Phosphatase: 74 U/L (ref 39–117)
BUN: 12 mg/dL (ref 6–23)
CO2: 28 meq/L (ref 19–32)
Calcium: 9.5 mg/dL (ref 8.4–10.5)
Chloride: 98 meq/L (ref 96–112)
Creatinine, Ser: 0.95 mg/dL (ref 0.40–1.50)
GFR: 81.22 mL/min (ref >60.00)
Glucose, Bld: 87 mg/dL (ref 70–99)
Potassium: 4.4 meq/L (ref 3.5–5.1)
Sodium: 139 meq/L (ref 135–145)
Total Bilirubin: 0.7 mg/dL (ref 0.2–1.2)
Total Protein: 7.6 g/dL (ref 6.0–8.3)

## 2024-01-03 LAB — TESTOSTERONE: Testosterone: 1401.41 ng/dL — ABNORMAL HIGH (ref 300.00–890.00)

## 2024-01-03 LAB — CBC WITH DIFFERENTIAL/PLATELET
Basophils Absolute: 0.1 K/uL (ref 0.0–0.1)
Basophils Relative: 0.7 % (ref 0.0–3.0)
Eosinophils Absolute: 0 K/uL (ref 0.0–0.7)
Eosinophils Relative: 0.2 % (ref 0.0–5.0)
HCT: 53.4 % — ABNORMAL HIGH (ref 39.0–52.0)
Hemoglobin: 17.6 g/dL — ABNORMAL HIGH (ref 13.0–17.0)
Lymphocytes Relative: 14.2 % (ref 12.0–46.0)
Lymphs Abs: 1.4 K/uL (ref 0.7–4.0)
MCHC: 33 g/dL (ref 30.0–36.0)
MCV: 91.6 fl (ref 78.0–100.0)
Monocytes Absolute: 0.8 K/uL (ref 0.1–1.0)
Monocytes Relative: 7.8 % (ref 3.0–12.0)
Neutro Abs: 7.7 K/uL (ref 1.4–7.7)
Neutrophils Relative %: 77.1 % — ABNORMAL HIGH (ref 43.0–77.0)
Platelets: 431 K/uL — ABNORMAL HIGH (ref 150.0–400.0)
RBC: 5.83 Mil/uL — ABNORMAL HIGH (ref 4.22–5.81)
RDW: 16.6 % — ABNORMAL HIGH (ref 11.5–15.5)
WBC: 10 K/uL (ref 4.0–10.5)

## 2024-01-03 LAB — URINALYSIS
Bilirubin Urine: NEGATIVE
Hgb urine dipstick: NEGATIVE
Ketones, ur: NEGATIVE
Leukocytes,Ua: NEGATIVE
Nitrite: NEGATIVE
Specific Gravity, Urine: <=1.005 — AB (ref 1.000–1.030)
Total Protein, Urine: NEGATIVE
Urine Glucose: NEGATIVE
Urobilinogen, UA: 0.2 (ref 0.0–1.0)
pH: 7 (ref 5.0–8.0)

## 2024-01-03 LAB — LIPID PANEL
Cholesterol: 231 mg/dL — ABNORMAL HIGH (ref 0–200)
HDL: 48.4 mg/dL (ref >39.00)
LDL Cholesterol: 166 mg/dL — ABNORMAL HIGH (ref 0–99)
NonHDL: 182.38
Total CHOL/HDL Ratio: 5
Triglycerides: 83 mg/dL (ref 0.0–149.0)
VLDL: 16.6 mg/dL (ref 0.0–40.0)

## 2024-01-03 LAB — TSH: TSH: 1.02 u[IU]/mL (ref 0.35–5.50)

## 2024-01-06 ENCOUNTER — Other Ambulatory Visit: Payer: Self-pay | Admitting: Internal Medicine

## 2024-01-06 DIAGNOSIS — M25551 Pain in right hip: Secondary | ICD-10-CM

## 2024-01-18 ENCOUNTER — Other Ambulatory Visit: Payer: Self-pay

## 2024-01-18 ENCOUNTER — Ambulatory Visit (INDEPENDENT_AMBULATORY_CARE_PROVIDER_SITE_OTHER): Admitting: Sports Medicine

## 2024-01-18 ENCOUNTER — Encounter: Payer: Self-pay | Admitting: Sports Medicine

## 2024-01-18 ENCOUNTER — Ambulatory Visit: Admitting: Orthopaedic Surgery

## 2024-01-18 DIAGNOSIS — M160 Bilateral primary osteoarthritis of hip: Secondary | ICD-10-CM

## 2024-01-18 DIAGNOSIS — M1611 Unilateral primary osteoarthritis, right hip: Secondary | ICD-10-CM | POA: Diagnosis not present

## 2024-01-18 DIAGNOSIS — M1612 Unilateral primary osteoarthritis, left hip: Secondary | ICD-10-CM

## 2024-01-18 DIAGNOSIS — M25552 Pain in left hip: Secondary | ICD-10-CM

## 2024-01-18 DIAGNOSIS — M25551 Pain in right hip: Secondary | ICD-10-CM | POA: Diagnosis not present

## 2024-01-18 MED ORDER — METHYLPREDNISOLONE ACETATE 40 MG/ML IJ SUSP
60.0000 mg | INTRAMUSCULAR | Status: AC | PRN
  Administered 2024-01-18: 60 mg via INTRA_ARTICULAR

## 2024-01-18 MED ORDER — LIDOCAINE HCL 1 % IJ SOLN
4.0000 mL | INTRAMUSCULAR | Status: AC | PRN
  Administered 2024-01-18: 4 mL

## 2024-01-18 NOTE — Progress Notes (Signed)
   Procedure Note  Patient: Victor Montes             Date of Birth: 19-Jun-1955           MRN: [id number]             Visit Date: 01/18/2024  Procedures: Visit Diagnoses:  1. Bilateral primary osteoarthritis of hip   2. Pain in right hip   3. Pain in left hip    Large Joint Inj: R hip joint on 01/18/2024 12:56 PM Indications: pain Details: 22 G 3.5 in needle, ultrasound-guided anterior approach Medications: 4 mL lidocaine  1 %; 60 mg methylPREDNISolone  acetate 40 MG/ML Outcome: tolerated well, no immediate complications  Procedure: US -guided intra-articular hip injection, Right After discussion on risks/benefits/indications and informed verbal consent was obtained, a timeout was performed. Patient was lying supine on exam table. The hip was cleaned with betadine and alcohol swabs. Then utilizing ultrasound guidance, the patient's femoral head and neck junction was identified and subsequently injected with 4:1.5 lidocaine :depomedrol via an in-plane approach with ultrasound visualization of the injectate administered into the hip joint. Patient tolerated procedure well without immediate complications.  Procedure, treatment alternatives, risks and benefits explained, specific risks discussed. Consent was given by the patient. Immediately prior to procedure a time out was called to verify the correct patient, procedure, equipment, support staff and site/side marked as required. Patient was prepped and draped in the usual sterile fashion.    Large Joint Inj: L hip joint on 01/18/2024 12:56 PM Indications: pain Details: 22 G 3.5 in needle, ultrasound-guided anterior approach Medications: 4 mL lidocaine  1 %; 60 mg methylPREDNISolone  acetate 40 MG/ML Outcome: tolerated well, no immediate complications  Procedure: US -guided intra-articular hip injection, Left After discussion on risks/benefits/indications and informed verbal consent was obtained, a timeout was performed. Patient was lying supine  on exam table. The hip was cleaned with betadine and alcohol swabs. Then utilizing ultrasound guidance, the patient's femoral head and neck junction was identified and subsequently injected with 4:1.5 lidocaine :depomedrol via an in-plane approach with ultrasound visualization of the injectate administered into the hip joint. Patient tolerated procedure well without immediate complications.  Procedure, treatment alternatives, risks and benefits explained, specific risks discussed. Consent was given by the patient. Immediately prior to procedure a time out was called to verify the correct patient, procedure, equipment, support staff and site/side marked as required. Patient was prepped and draped in the usual sterile fashion.     - patient tolerated procedure well, discussed post-injection protocol - follow-up with Dr. Jerri as indicated; I am happy to see them as needed  Lonell Sprang, DO Primary Care Sports Medicine Physician  [facility]  This note was dictated using Dragon naturally speaking software and may contain errors in syntax, spelling, or content which have not been identified prior to signing this note.

## 2024-01-18 NOTE — Progress Notes (Signed)
 Office Visit Note   Patient: Victor Montes           Date of Birth: 08-May-1956           MRN: [id number] Visit Date: 01/18/2024              Requested by: Garald Karlynn GAILS, MD [address] [place],  [place] [address] PCP: Plotnikov, Karlynn GAILS, MD   Assessment & Plan: Visit Diagnoses:  1. Primary osteoarthritis of right hip   2. Primary osteoarthritis of left hip     Plan: History of Present Illness Victor Montes is a 68 year old male who presents with low back and bilateral hip pain.  He has experienced low back and bilateral hip pain for three years, originating in the buttocks and progressing to the lower back with walking. The pain can become severe enough to cause leg shaking. No groin pain is present.  He previously received Kenalog  40 injections in the hip joints under fluoroscopy, which provided relief, but the pain has returned. This impacts daily activities, such as grocery shopping, where he leans on the cart for support. He has become less active due to fear of falling.  He last received hip injections 15 months ago. He has been taking Relafen  daily for three years, but it is no longer effective. Increasing the dosage to one and a half tablets has not extended relief.  There is no numbness or tingling in his legs, although there are some sensation changes without significant pain.  Physical Exam MEASUREMENTS: Height- 6'6. MUSCULOSKELETAL: Right hip pain on internal rotation. Left hip pain on movement. Excellent hip strength.  Results RADIOLOGY Hip X-ray: Arthritic changes with osteophyte formation (10/2021) screenshot of x-rays on patient's phone  Assessment and Plan Bilateral hip osteoarthritis Chronic bilateral hip osteoarthritis with arthritic changes and pain exacerbated by ambulation. Current pain management with Relafen  is inadequate. Not a candidate for hip arthroplasty yet. - Arrange hip injections today if possible with Dr. Burnetta.  Follow-Up  Instructions: No follow-ups on file.   Orders:  No orders of the defined types were placed in this encounter.  No orders of the defined types were placed in this encounter.     Procedures: No procedures performed   Clinical Data: No additional findings.   Subjective: Chief Complaint  Patient presents with   Right Hip - Pain   Left Hip - Pain   Lower Back - Pain    HPI  Review of Systems  Constitutional: Negative.   HENT: Negative.    Eyes: Negative.   Respiratory: Negative.    Cardiovascular: Negative.   Gastrointestinal: Negative.   Endocrine: Negative.   Genitourinary: Negative.   Skin: Negative.   Allergic/Immunologic: Negative.   Neurological: Negative.   Hematological: Negative.   Psychiatric/Behavioral: Negative.    All other systems reviewed and are negative.    Objective: Vital Signs: There were no vitals taken for this visit.  Physical Exam Vitals and nursing note reviewed.  Constitutional:      Appearance: He is well-developed.  HENT:     Head: Normocephalic and atraumatic.  Eyes:     Pupils: Pupils are equal, round, and reactive to light.  Pulmonary:     Effort: Pulmonary effort is normal.  Abdominal:     Palpations: Abdomen is soft.  Musculoskeletal:        General: Normal range of motion.     Cervical back: Neck supple.  Skin:    General: Skin is warm.  Neurological:     Mental Status: He is alert and oriented to person, place, and time.  Psychiatric:        Behavior: Behavior normal.        Thought Content: Thought content normal.        Judgment: Judgment normal.     Ortho Exam  Specialty Comments:  No specialty comments available.  Imaging: No results found.   PMFS History: Patient Active Problem List   Diagnosis Date Noted   Chronic SI joint pain 12/26/2022   Polycythemia 12/26/2022   Peyronie's disease 06/27/2022   History of colon polyps 06/27/2022   Hip pain 04/03/2022   Bladder neck obstruction 04/02/2022    Mucous cyst of joint 03/27/2022   Plantar wart 08/06/2021   Depression 12/27/2020   Low back pain 12/27/2020   OAB (overactive bladder) 04/08/2019   Trigeminal neuralgia 03/31/2019   Discoloration of skin of foot 12/10/2018   Paresthesia of foot 12/10/2018   Asthmatic bronchitis 10/09/2017   Hypertension 07/11/2017   Epididymitis 07/05/2016   Achilles tendinitis of left lower extremity 08/26/2015   Retrocalcaneal bursitis 08/26/2015   Heel spur 08/26/2015   Heel pain 08/25/2015   Erectile dysfunction 10/28/2014   Insomnia 06/05/2013   Grief 06/04/2013   Urinary urgency 04/07/2013   Obstructive sleep apnea 04/07/2012   Well adult exam 09/20/2011   Paresthesia 09/20/2011   Fatigue 11/25/2010   Occipital neuralgia of right side 04/20/2010   Otitis media 03/30/2010   Headache 03/30/2010   SHOULDER PAIN 10/25/2009   CHANGE IN BOWELS 10/06/2009   SEBACEOUS CYST, INFECTED 05/19/2009   Cervical pain (neck) 05/19/2009   PARESTHESIA 05/19/2009   Tobacco abuse 05/19/2009   OTHER CONGENITAL ANOMALY GALLBLADDER BDS&LIVER 12/16/2007   WEIGHT LOSS 12/02/2007   ORAL ULCER 11/21/2007   Neoplasm of uncertain behavior of skin 04/10/2007   ELBOW PAIN 04/10/2007   Hypogonadism in male 04/08/2007   GLUCOSE INTOLERANCE 04/08/2007   Dyslipidemia 04/08/2007   Attention deficit disorder 04/08/2007   SINUSITIS- ACUTE-NOS 04/08/2007   Allergic rhinitis 04/08/2007   Past Medical History:  Diagnosis Date   ADD (attention deficit disorder)    Allergic rhinitis    Allergy    Asthma    Cancer (HCC)    basal cell skin cancer    Cervical radiculopathy 2007   Right- Dr. Carles    Constipation 2011   Glucose intolerance (impaired glucose tolerance)    Heart murmur    Hyperlipidemia    Hypogonadism male    Occipital neuralgia 2011   Left   OSA (obstructive sleep apnea)    Sleep apnea    no cpap currently     Family History  Problem Relation Age of Onset   Colon cancer Mother 5    Cancer Mother 24       colon ca   Stroke Father    Heart disease Father        A fib   Hypertension Other    Coronary artery disease Neg Hx    Colon polyps Neg Hx    Esophageal cancer Neg Hx    Rectal cancer Neg Hx    Stomach cancer Neg Hx     Past Surgical History:  Procedure Laterality Date   COLONOSCOPY     POLYPECTOMY     UVULOPALATOPLASTY     s/p   Social History   Occupational History   Occupation: Retired  Tobacco Use   Smoking status: Some Days    Current packs/day:  0.10    Average packs/day: 0.1 packs/day for 20.0 years (2.0 ttl pk-yrs)    Types: E-cigarettes, Cigarettes   Smokeless tobacco: Never   Tobacco comments:    does vape    Vaping Use   Vaping status: Some Days   Substances: Nicotine, Flavoring, Nicotine-salt  Substance and Sexual Activity   Alcohol use: Not Currently    Alcohol/week: 3.0 standard drinks of alcohol    Types: 3 Cans of beer per week   Drug use: No   Sexual activity: Yes

## 2024-01-22 ENCOUNTER — Ambulatory Visit: Admitting: Internal Medicine

## 2024-01-22 ENCOUNTER — Ambulatory Visit
Admission: RE | Admit: 2024-01-22 | Discharge: 2024-01-22 | Disposition: A | Discharge status: Home / Self Care | Source: Ambulatory Visit | Attending: Internal Medicine | Admitting: Internal Medicine

## 2024-01-22 VITALS — BP 138/50 | HR 85 | Temp 98.2°F | Ht 78.0 in | Wt 239.0 lb

## 2024-01-22 DIAGNOSIS — M533 Sacrococcygeal disorders, not elsewhere classified: Secondary | ICD-10-CM

## 2024-01-22 DIAGNOSIS — F909 Attention-deficit hyperactivity disorder, unspecified type: Secondary | ICD-10-CM

## 2024-01-22 DIAGNOSIS — R296 Repeated falls: Secondary | ICD-10-CM | POA: Diagnosis not present

## 2024-01-22 DIAGNOSIS — G8929 Other chronic pain: Secondary | ICD-10-CM

## 2024-01-22 DIAGNOSIS — D751 Secondary polycythemia: Secondary | ICD-10-CM

## 2024-01-22 DIAGNOSIS — E291 Testicular hypofunction: Secondary | ICD-10-CM | POA: Diagnosis not present

## 2024-01-22 DIAGNOSIS — M25551 Pain in right hip: Secondary | ICD-10-CM

## 2024-01-22 DIAGNOSIS — M545 Low back pain, unspecified: Secondary | ICD-10-CM | POA: Diagnosis not present

## 2024-01-22 DIAGNOSIS — M25552 Pain in left hip: Secondary | ICD-10-CM

## 2024-01-22 MED ORDER — AMPHETAMINE-DEXTROAMPHETAMINE 10 MG PO TABS: 10.0000 mg | ORAL_TABLET | Freq: Two times a day (BID) | ORAL | 0 refills | Status: AC

## 2024-01-22 MED ORDER — AMPHETAMINE-DEXTROAMPHETAMINE 10 MG PO TABS: 10.0000 mg | ORAL_TABLET | Freq: Two times a day (BID) | ORAL | 0 refills | Status: DC

## 2024-01-22 MED ORDER — CYCLOBENZAPRINE HCL 10 MG PO TABS: 10.0000 mg | ORAL_TABLET | Freq: Three times a day (TID) | ORAL | 1 refills | Status: AC | PRN

## 2024-01-22 NOTE — Assessment & Plan Note (Signed)
Continue on Adderall.   Potential benefits of a long term stimulants use as well as potential risks  and complications were explained to the patient and were aknowledged.

## 2024-01-22 NOTE — Progress Notes (Signed)
 Subjective:  Patient ID: Victor Montes, male    DOB: December 13, 1955  Age: 68 y.o. MRN: [id number]  CC: Follow-up (31mo f/u; want to discuss balance issues; been going on for about a month and a half; probably fell about 2-3times; also want to discuss latest lab results specifically cholesterol levels )   HPI Victor Montes presents for ADD, chronic pain in the hips - s/p B hip injections: better (Victor Montes)  Victor Montes fell a few times - he lost his balance like someone pushed me) x 2 months  Outpatient Medications Prior to Visit  Medication Sig Dispense Refill   aspirin 325 MG tablet Take 325 mg by mouth daily.     b complex vitamins tablet Take 1 tablet by mouth daily.     cetirizine (ZYRTEC) 10 MG tablet Take 10 mg by mouth daily.     MAGNESIUM PO Take 1 tablet by mouth daily.     montelukast  (SINGULAIR ) 10 MG tablet Take 1 tablet by mouth daily. 90 tablet 2   Multiple Vitamin (MULTIVITAMIN WITH MINERALS) TABS tablet Take 1 tablet by mouth daily.     nabumetone  (RELAFEN ) 750 MG tablet Take 1 tablet by mouth twice daily as needed for moderate pain. 180 tablet 0   predniSONE  (DELTASONE ) 10 MG tablet Prednisone  10 mg: take 4 tabs a day x 3 days; then 3 tabs a day x 4 days; then 2 tabs a day x 4 days, then 1 tab a day x 6 days, then stop. Take pc. 38 tablet 1   sertraline  (ZOLOFT ) 100 MG tablet Take 1 tablet (100 mg total) by mouth daily. 90 tablet 3   SYRINGE-NEEDLE, DISP, 3 ML (BD ECLIPSE SYRINGE) 25G X 1 3 ML MISC USE AS DIRECTED FOR IM INJECTION 50 each 11   tadalafil  (CIALIS ) 5 MG tablet Take 1 tablet by mouth daily. 90 tablet 2   testosterone  cypionate (DEPOTESTOSTERONE CYPIONATE) 200 MG/ML injection Inject 2 ml into the muscle every 2 weeks. 12 mL 1   amphetamine -dextroamphetamine  (ADDERALL) 10 MG tablet Take 1 tablet (10 mg total) by mouth 2 (two) times daily. 60 tablet 0   amphetamine -dextroamphetamine  (ADDERALL) 10 MG tablet Take 1 tablet (10 mg total) by mouth 2 (two) times daily. 60 tablet 0    amphetamine -dextroamphetamine  (ADDERALL) 10 MG tablet Take 1 tablet (10 mg total) by mouth 2 (two) times daily. 60 tablet 0   cyclobenzaprine  (FLEXERIL ) 10 MG tablet Take 1 tablet (10 mg total) by mouth 3 (three) times daily as needed for muscle spasms. 30 tablet 1   KRILL OIL PO Take by mouth.     No facility-administered medications prior to visit.    ROS: Review of Systems  Constitutional:  Negative for appetite change, fatigue and unexpected weight change.  HENT:  Negative for congestion, nosebleeds, sneezing, sore throat and trouble swallowing.   Eyes:  Negative for itching and visual disturbance.  Respiratory:  Negative for cough.   Cardiovascular:  Negative for chest pain, palpitations and leg swelling.  Gastrointestinal:  Negative for abdominal distention, blood in stool, diarrhea and nausea.  Genitourinary:  Negative for frequency and hematuria.  Musculoskeletal:  Positive for gait problem. Negative for back pain, joint swelling and neck pain.  Skin:  Negative for rash.  Neurological:  Negative for dizziness, tremors, speech difficulty and weakness.  Psychiatric/Behavioral:  Negative for agitation, dysphoric mood, sleep disturbance and suicidal ideas. The patient is not nervous/anxious.     Objective:  BP (!) 138/50   Pulse 85  Temp 98.2 F (36.8 C)   Ht 6' 6 (1.981 m)   Wt 239 lb (108.4 kg)   SpO2 97%   BMI 27.62 kg/m   BP Readings from Last 3 Encounters:  01/22/24 (!) 138/50  10/23/23 118/70  07/24/23 122/80    Wt Readings from Last 3 Encounters:  01/22/24 239 lb (108.4 kg)  10/23/23 253 lb (114.8 kg)  07/24/23 247 lb (112 kg)    Physical Exam Constitutional:      General: He is not in acute distress.    Appearance: He is well-developed.     Comments: NAD  Eyes:     Conjunctiva/sclera: Conjunctivae normal.     Pupils: Pupils are equal, round, and reactive to light.  Neck:     Thyroid : No thyromegaly.     Vascular: No JVD.  Cardiovascular:      Rate and Rhythm: Normal rate and regular rhythm.     Heart sounds: Normal heart sounds. No murmur heard.    No friction rub. No gallop.  Pulmonary:     Effort: Pulmonary effort is normal. No respiratory distress.     Breath sounds: Normal breath sounds. No wheezing or rales.  Chest:     Chest wall: No tenderness.  Abdominal:     General: Bowel sounds are normal. There is no distension.     Palpations: Abdomen is soft. There is no mass.     Tenderness: There is no abdominal tenderness. There is no guarding or rebound.  Musculoskeletal:        General: No tenderness. Normal range of motion.     Cervical back: Normal range of motion.  Lymphadenopathy:     Cervical: No cervical adenopathy.  Skin:    General: Skin is warm and dry.     Findings: No rash.  Neurological:     Mental Status: He is alert and oriented to person, place, and time.     Cranial Nerves: No cranial nerve deficit.     Motor: No abnormal muscle tone.     Coordination: Coordination normal.     Gait: Gait abnormal.     Deep Tendon Reflexes: Reflexes are normal and symmetric.  Psychiatric:        Behavior: Behavior normal.        Thought Content: Thought content normal.        Judgment: Judgment normal.   Wider base gait, ataxic Knee flexors are weak R>L Hyperreflexia B knees Poor str line walk  Lab Results  Component Value Date   WBC 10.0 01/03/2024   HGB 17.6 (H) 01/03/2024   HCT 53.4 (H) 01/03/2024   PLT 431.0 (H) 01/03/2024   GLUCOSE 87 01/03/2024   CHOL 231 (H) 01/03/2024   TRIG 83.0 01/03/2024   HDL 48.40 01/03/2024   LDLDIRECT 137.0 03/27/2022   LDLCALC 166 (H) 01/03/2024   ALT 28 01/03/2024   AST 22 01/03/2024   NA 139 01/03/2024   K 4.4 01/03/2024   CL 98 01/03/2024   CREATININE 0.95 01/03/2024   BUN 12 01/03/2024   CO2 28 01/03/2024   TSH 1.02 01/03/2024   PSA 2.82 01/03/2024   HGBA1C 5.6 07/11/2017    No results found.  Assessment & Plan:   Problem List Items Addressed This Visit      Attention deficit disorder   Continue on Adderall.   Potential benefits of a long term stimulants use as well as potential risks  and complications were explained to the patient and were aknowledged.  Chronic SI joint pain   Relevant Medications   cyclobenzaprine  (FLEXERIL ) 10 MG tablet   amphetamine -dextroamphetamine  (ADDERALL) 10 MG tablet   Falls frequently - Primary   Sahas fell a few times - he lost his balance like someone pushed me) x 2 months.Brain vs spinal stenosis issue Head CT today. May need an MRI      Relevant Orders   CT HEAD WO CONTRAST ( )   Hip pain   Relevant Medications   amphetamine -dextroamphetamine  (ADDERALL) 10 MG tablet   Hypogonadism in male   Donating blood - next one is in April      Low back pain   Tristin fell a few times - he lost his balance like someone pushed me) x 2 months.Brain vs spinal stenosis issue Head CT today. May need an MRI      Relevant Medications   cyclobenzaprine  (FLEXERIL ) 10 MG tablet   Polycythemia   Donating blood regular at [facility]         Meds ordered this encounter  Medications   cyclobenzaprine  (FLEXERIL ) 10 MG tablet    Sig: Take 1 tablet (10 mg total) by mouth 3 (three) times daily as needed for muscle spasms.    Dispense:  30 tablet    Refill:  1   amphetamine -dextroamphetamine  (ADDERALL) 10 MG tablet    Sig: Take 1 tablet (10 mg total) by mouth 2 (two) times daily.    Dispense:  60 tablet    Refill:  0    Please fill on or after 02/19/2024   amphetamine -dextroamphetamine  (ADDERALL) 10 MG tablet    Sig: Take 1 tablet (10 mg total) by mouth 2 (two) times daily.    Dispense:  60 tablet    Refill:  0    Please fill on or after 03/20/2024   amphetamine -dextroamphetamine  (ADDERALL) 10 MG tablet    Sig: Take 1 tablet (10 mg total) by mouth 2 (two) times daily.    Dispense:  60 tablet    Refill:  0    Please fill on or after 04/19/2024      Follow-up: Return in about 3 months (around  04/22/2024) for a follow-up visit.  Marolyn Noel, MD

## 2024-01-22 NOTE — Assessment & Plan Note (Signed)
 Victor Montes fell a few times - he lost his balance like someone pushed me) x 2 months.Brain vs spinal stenosis issue Head CT today. May need an MRI

## 2024-01-22 NOTE — Assessment & Plan Note (Signed)
 Donating blood regular at [facility]

## 2024-01-22 NOTE — Assessment & Plan Note (Signed)
 Donating blood - next one is in April

## 2024-01-23 ENCOUNTER — Ambulatory Visit: Payer: Self-pay | Admitting: Internal Medicine

## 2024-02-22 ENCOUNTER — Ambulatory Visit

## 2024-02-22 VITALS — Ht 78.0 in | Wt 236.0 lb

## 2024-02-22 DIAGNOSIS — Z0000 Encounter for general adult medical examination without abnormal findings: Secondary | ICD-10-CM

## 2024-02-22 DIAGNOSIS — K635 Polyp of colon: Secondary | ICD-10-CM | POA: Diagnosis not present

## 2024-02-22 NOTE — Patient Instructions (Addendum)
 Victor Montes,  Thank you for taking the time for your Medicare Wellness Visit. I appreciate your continued commitment to your health goals. Please review the care plan we discussed, and feel free to reach out if I can assist you further.  Medicare recommends these wellness visits once per year to help you and your care team stay ahead of potential health issues. These visits are designed to focus on prevention, allowing your provider to concentrate on managing your acute and chronic conditions during your regular appointments.  Please note that Annual Wellness Visits do not include a physical exam. Some assessments may be limited, especially if the visit was conducted virtually. If needed, we may recommend a separate in-person follow-up with your provider.  Ongoing Care Seeing your primary care provider every 3 to 6 months helps us  monitor your health and provide consistent, personalized care.   Referrals If a referral was made during today's visit and you haven't received any updates within two weeks, please contact the referred provider directly to check on the status.  Recommended Screenings:  Health Maintenance  Topic Date Due   Colon Cancer Screening  02/11/2023   Flu Shot  12/21/2023   COVID-19 Vaccine (6 - 2025-26 season) 04/12/2024   Medicare Annual Wellness Visit  02/21/2025   DTaP/Tdap/Td vaccine (3 - Td or Tdap) 07/16/2032   Pneumococcal Vaccine for age over 94  Completed   Hepatitis C Screening  Completed   HPV Vaccine  Aged Out   Meningitis B Vaccine  Aged Out   Zoster (Shingles) Vaccine  Discontinued       02/22/2024    9:32 AM  Advanced Directives  Does Patient Have a Medical Advance Directive? Yes  Type of Estate agent of [place];Living will  Copy of Healthcare Power of Attorney in Chart? No - copy requested   Advance Care Planning is important because it: Ensures you receive medical care that aligns with your values, goals, and  preferences. Provides guidance to your family and loved ones, reducing the emotional burden of decision-making during critical moments.  Vision: Annual vision screenings are recommended for early detection of glaucoma, cataracts, and diabetic retinopathy. These exams can also reveal signs of chronic conditions such as diabetes and high blood pressure.  Dental: Annual dental screenings help detect early signs of oral cancer, gum disease, and other conditions linked to overall health, including heart disease and diabetes.

## 2024-02-22 NOTE — Progress Notes (Signed)
 Subjective:  Please attest and cosign this visit due to patients primary care provider not being in the office at the time the visit was completed.  (Pt of Dr. DELENA. Plotnikov)   Victor Montes is a 68 y.o. who presents for a Medicare Wellness preventive visit.  As a reminder, Annual Wellness Visits don't include a physical exam, and some assessments may be limited, especially if this visit is performed virtually. We may recommend an in-person follow-up visit with your provider if needed.  Visit Complete: Virtual I connected with  [name] Feathers on 02/22/24 by a audio enabled telemedicine application and verified that I am speaking with the correct person using two identifiers.  Patient Location: Home  Provider Location: Office/Clinic  I discussed the limitations of evaluation and management by telemedicine. The patient expressed understanding and agreed to proceed.  Vital Signs: Because this visit was a virtual/telehealth visit, some criteria may be missing or patient reported. Any vitals not documented were not able to be obtained and vitals that have been documented are patient reported.  VideoDeclined- This patient declined Librarian, academic. Therefore the visit was completed with audio only.  Persons Participating in Visit: Patient.  AWV Questionnaire: Yes: Patient Medicare AWV questionnaire was completed by the patient on 02/21/2024; I have confirmed that all information answered by patient is correct and no changes since this date.  Cardiac Risk Factors include: advanced age (>48men, >59 women);dyslipidemia;hypertension;male gender;smoking/ tobacco exposure (smoking > vaping)     Objective:    Today's Vitals   02/22/24 0933  Weight: 236 lb (107 kg)  Height: 6' 6 (1.981 m)   Body mass index is 27.27 kg/m.     02/22/2024    9:32 AM 01/12/2023   10:42 AM 01/09/2022   11:00 AM 12/27/2020    9:35 AM 06/09/2016    9:21 AM  Advanced Directives  Does  Patient Have a Medical Advance Directive? Yes Yes No No No   Type of Estate agent of [place];Living will Healthcare Power of [place];Living will     Copy of Healthcare Power of Attorney in Chart? No - copy requested No - copy requested     Would patient like information on creating a medical advance directive?   No - Patient declined No - Patient declined No - Patient declined      Data saved with a previous flowsheet row definition    Current Medications (verified) Outpatient Encounter Medications as of 02/22/2024  Medication Sig   amphetamine -dextroamphetamine  (ADDERALL) 10 MG tablet Take 1 tablet (10 mg total) by mouth 2 (two) times daily.   amphetamine -dextroamphetamine  (ADDERALL) 10 MG tablet Take 1 tablet (10 mg total) by mouth 2 (two) times daily.   amphetamine -dextroamphetamine  (ADDERALL) 10 MG tablet Take 1 tablet (10 mg total) by mouth 2 (two) times daily.   aspirin 325 MG tablet Take 325 mg by mouth daily.   b complex vitamins tablet Take 1 tablet by mouth daily.   cetirizine (ZYRTEC) 10 MG tablet Take 10 mg by mouth daily.   cyclobenzaprine  (FLEXERIL ) 10 MG tablet Take 1 tablet (10 mg total) by mouth 3 (three) times daily as needed for muscle spasms.   MAGNESIUM PO Take 1 tablet by mouth daily.   montelukast  (SINGULAIR ) 10 MG tablet Take 1 tablet by mouth daily.   Multiple Vitamin (MULTIVITAMIN WITH MINERALS) TABS tablet Take 1 tablet by mouth daily.   nabumetone  (RELAFEN ) 750 MG tablet Take 1 tablet by mouth twice daily as needed  for moderate pain.   predniSONE  (DELTASONE ) 10 MG tablet Prednisone  10 mg: take 4 tabs a day x 3 days; then 3 tabs a day x 4 days; then 2 tabs a day x 4 days, then 1 tab a day x 6 days, then stop. Take pc.   sertraline  (ZOLOFT ) 100 MG tablet Take 1 tablet (100 mg total) by mouth daily.   SYRINGE-NEEDLE, DISP, 3 ML (BD ECLIPSE SYRINGE) 25G X 1 3 ML MISC USE AS DIRECTED FOR IM INJECTION   tadalafil  (CIALIS ) 5 MG tablet Take 1  tablet by mouth daily.   testosterone  cypionate (DEPOTESTOSTERONE CYPIONATE) 200 MG/ML injection Inject 2 ml into the muscle every 2 weeks.   [DISCONTINUED] KRILL OIL PO Take by mouth.   No facility-administered encounter medications on file as of 02/22/2024.    Allergies (verified) Patient has no known allergies.   History: Past Medical History:  Diagnosis Date   ADD (attention deficit disorder)    Allergic rhinitis    Allergy    Asthma    Cancer (HCC)    basal cell skin cancer    Cervical radiculopathy 2007   Right- Dr. Carles    Constipation 2011   Glucose intolerance (impaired glucose tolerance)    Heart murmur    Hyperlipidemia    Hypogonadism male    Occipital neuralgia 2011   Left   OSA (obstructive sleep apnea)    Sleep apnea    no cpap currently    Past Surgical History:  Procedure Laterality Date   COLONOSCOPY     POLYPECTOMY     UVULOPALATOPLASTY     s/p   Family History  Problem Relation Age of Onset   Colon cancer Mother 79   Cancer Mother 23       colon ca   Stroke Father    Heart disease Father        A fib   Hypertension Other    Coronary artery disease Neg Hx    Colon polyps Neg Hx    Esophageal cancer Neg Hx    Rectal cancer Neg Hx    Stomach cancer Neg Hx    Social History   Socioeconomic History   Marital status: Single    Spouse name: Not on file   Number of children: Not on file   Years of education: Not on file   Highest education level: Some college, no degree  Occupational History   Occupation: Retired  Tobacco Use   Smoking status: Some Days    Current packs/day: 0.10    Average packs/day: 0.1 packs/day for 20.0 years (2.0 ttl pk-yrs)    Types: E-cigarettes, Cigarettes   Smokeless tobacco: Never   Tobacco comments:    does vape    Vaping Use   Vaping status: Some Days   Substances: Nicotine, Flavoring, Nicotine-salt  Substance and Sexual Activity   Alcohol use: Not Currently   Drug use: No   Sexual activity: Yes   Other Topics Concern   Not on file  Social History Narrative   Lives alone.   Social Drivers of Corporate investment banker Strain: Low Risk  (02/22/2024)   Overall Financial Resource Strain (CARDIA)    Difficulty of Paying Living Expenses: Not hard at all  Food Insecurity: No Food Insecurity (02/22/2024)   Hunger Vital Sign    Worried About Running Out of Food in the Last Year: Never true    Ran Out of Food in the Last Year: Never true  Transportation Needs: No Transportation Needs (02/22/2024)   PRAPARE - Administrator, Civil Service (Medical): No    Lack of Transportation (Non-Medical): No  Physical Activity: Insufficiently Active (02/22/2024)   Exercise Vital Sign    Days of Exercise per Week: 2 days    Minutes of Exercise per Session: 30 min  Stress: No Stress Concern Present (02/22/2024)   [employer] of Occupational Health - Occupational Stress Questionnaire    Feeling of Stress: Only a little  Social Connections: Moderately Isolated (02/22/2024)   Social Connection and Isolation Panel    Frequency of Communication with Friends and Family: Twice a week    Frequency of Social Gatherings with Friends and Family: Once a week    Attends Religious Services: 1 to 4 times per year    Active Member of [employer] or Organizations: No    Attends Engineer, structural: Never    Marital Status: Never married    Tobacco Counseling Ready to quit: No Counseling given: Yes Tobacco comments: does vape      Clinical Intake:  Pre-visit preparation completed: Yes  Pain : No/denies pain     BMI - recorded: 27.27 Nutritional Status: BMI 25 -29 Overweight Nutritional Risks: None Diabetes: No  Lab Results  Component Value Date   HGBA1C 5.6 07/11/2017   HGBA1C 5.5 01/02/2013     How often do you need to have someone help you when you read instructions, pamphlets, or other written materials from your doctor or pharmacy?: 1 - Never  Interpreter Needed?:  No  Information entered by :: Verdie Saba, CMA   Activities of Daily Living     02/22/2024    9:36 AM 02/21/2024    7:23 PM  In your present state of health, do you have any difficulty performing the following activities:  Hearing? 0 0  Vision? 0 0  Difficulty concentrating or making decisions? 0 0  Walking or climbing stairs? 0 0  Dressing or bathing? 0 0  Doing errands, shopping? 0 0  Preparing Food and eating ? N N  Using the Toilet? N N  In the past six months, have you accidently leaked urine? Y Y  Do you have problems with loss of bowel control? N N  Managing your Medications? N N  Managing your Finances? N N  Housekeeping or managing your Housekeeping? N N    Patient Care Team: Plotnikov, Karlynn GAILS, MD as PCP - General Dr. Debby Ohara, OD (Optometry) Jerri Kay HERO, MD as Attending Physician (Orthopedic Surgery)  I have updated your Care Teams any recent Medical Services you may have received from other providers in the past year.     Assessment:   This is a routine wellness examination for [employer].  Hearing/Vision screen Hearing Screening - Comments:: Denies hearing difficulties   Vision Screening - Comments:: Wears contact lenses - plans to schedule an appt with an Ophthalmologist     Goals Addressed               This Visit's Progress     Patient Stated (pt-stated)        Patient stated he plans to continue to watch diet and manage weight - manage hips discomfort and activity       Depression Screen     02/22/2024    9:37 AM 01/22/2024    1:40 PM 07/24/2023   11:16 AM 04/24/2023   11:27 AM 01/12/2023   10:44 AM 12/26/2022   11:20 AM  09/26/2022    1:26 PM  PHQ 2/9 Scores  PHQ - 2 Score 3 2 0 2 0 0 0  PHQ- 9 Score 19 7 0 7 3 0 2    Fall Risk     02/22/2024    9:36 AM 02/21/2024    7:23 PM 01/22/2024    1:48 PM 07/24/2023   11:15 AM 04/24/2023   11:27 AM  Fall Risk   Falls in the past year? 1 1 1  0 1  Number falls in past yr: 1 1 1  0 0  Comment 3       Injury with Fall? 0 0 0 0 0  Risk for fall due to :   Impaired balance/gait No Fall Risks History of fall(s)  Follow up Falls evaluation completed;Falls prevention discussed  Falls evaluation completed Falls evaluation completed Falls evaluation completed    MEDICARE RISK AT HOME:  Medicare Risk at Home Any stairs in or around the home?: No If so, are there any without handrails?: No Home free of loose throw rugs in walkways, pet beds, electrical cords, etc?: Yes Adequate lighting in your home to reduce risk of falls?: Yes Life alert?: No Use of a cane, walker or w/c?: No Grab bars in the bathroom?: No Shower chair or bench in shower?: No Elevated toilet seat or a handicapped toilet?: No  TIMED UP AND GO:  Was the test performed?  No  Cognitive Function: 6CIT completed        02/22/2024    9:41 AM 01/12/2023   10:43 AM  6CIT Screen  What Year? 0 points 0 points  What month? 0 points 0 points  What time? 0 points 0 points  Count back from 20 0 points 0 points  Months in reverse 0 points 0 points  Repeat phrase 0 points 0 points  Total Score 0 points 0 points    Immunizations Immunization History  Administered Date(s) Administered   Fluad Quad(high Dose 65+) 03/31/2019, 03/30/2020, 03/30/2021, 03/27/2022   Influenza Split 03/01/2011   Influenza,inj,Quad PF,6+ Mos 01/16/2014, 07/05/2016, 02/04/2018   Influenza-Unspecified 03/17/2013, 01/21/2023   PFIZER(Purple Top)SARS-COV-2 Vaccination 07/21/2019, 08/19/2019, 03/05/2020   PNEUMOCOCCAL CONJUGATE-20 06/27/2022   Pfizer(Comirnaty)Fall Seasonal Vaccine 12 years and older 02/16/2024   Tdap 09/20/2011, 07/16/2022   Unspecified SARS-COV-2 Vaccination 02/27/2023   Zoster Recombinant(Shingrix) 07/16/2022, 09/16/2022    Screening Tests Health Maintenance  Topic Date Due   Colonoscopy  02/11/2023   Influenza Vaccine  12/21/2023   COVID-19 Vaccine (6 - 2025-26 season) 04/12/2024   Medicare Annual Wellness (AWV)   02/21/2025   DTaP/Tdap/Td (3 - Td or Tdap) 07/16/2032   Pneumococcal Vaccine: 50+ Years  Completed   Hepatitis C Screening  Completed   HPV VACCINES  Aged Out   Meningococcal B Vaccine  Aged Out   Zoster Vaccines- Shingrix  Discontinued    Health Maintenance Items Addressed:  Referral sent to GI for colonoscopy due to h/o colon polyps  Additional Screening:  Vision Screening: Recommended annual ophthalmology exams for early detection of glaucoma and other disorders of the eye. Is the patient up to date with their annual eye exam?  No  Who is the provider or what is the name of the office in which the patient attends annual eye exams? Plans to schedule an appt with an Ophthalmologist   Dental Screening: Recommended annual dental exams for proper oral hygiene  Community Resource Referral / Chronic Care Management: CRR required this visit?  No   CCM required this  visit?  No   Plan:    I have personally reviewed and noted the following in the patient's chart:   Medical and social history Use of alcohol, tobacco or illicit drugs  Current medications and supplements including opioid prescriptions. Patient is not currently taking opioid prescriptions. Functional ability and status Nutritional status Physical activity Advanced directives List of other physicians Hospitalizations, surgeries, and ER visits in previous 12 months Vitals Screenings to include cognitive, depression, and falls Referrals and appointments  In addition, I have reviewed and discussed with patient certain preventive protocols, quality metrics, and best practice recommendations. A written personalized care plan for preventive services as well as general preventive health recommendations were provided to patient.   Verdie CHRISTELLA Saba, CMA   02/22/2024   After Visit Summary: (MyChart) Due to this being a telephonic visit, the after visit summary with patients personalized plan was offered to patient via  MyChart   Notes: Nothing significant to report at this time.

## 2024-03-07 ENCOUNTER — Other Ambulatory Visit: Payer: Self-pay | Admitting: Internal Medicine

## 2024-03-18 ENCOUNTER — Encounter: Payer: Self-pay | Admitting: Orthopaedic Surgery

## 2024-03-18 ENCOUNTER — Ambulatory Visit (INDEPENDENT_AMBULATORY_CARE_PROVIDER_SITE_OTHER): Admitting: Orthopaedic Surgery

## 2024-03-18 DIAGNOSIS — M25552 Pain in left hip: Secondary | ICD-10-CM

## 2024-03-18 DIAGNOSIS — M160 Bilateral primary osteoarthritis of hip: Secondary | ICD-10-CM

## 2024-03-18 DIAGNOSIS — M25551 Pain in right hip: Secondary | ICD-10-CM | POA: Diagnosis not present

## 2024-03-18 NOTE — Progress Notes (Signed)
 Office Visit Note   Patient: Victor Montes           Date of Birth: 23-May-1955           MRN: [id number] Visit Date: 03/18/2024              Requested by: Garald Karlynn GAILS, MD [address] [place],  [place] [address] PCP: Plotnikov, Karlynn GAILS, MD   Assessment & Plan: Visit Diagnoses:  1. Bilateral primary osteoarthritis of hip   2. Pain in left hip   3. Pain in right hip     Plan: History of Present Illness [name] is a 68 year old male who presents with worsening back and hip pain.  He experiences severe back pain that affects his balance, feeling as if someone is pushing him over, necessitating a 'two step' to regain stability. The pain is primarily in the back with occasional radiation down the sides. Injections have provided minimal relief, and the pain remains significant.  He actively participates in physical therapy at a facility and at home using equipment like a ball and straps, but continues to experience significant discomfort. Hip pain is less intense than the back pain but persists despite previous treatments. Radiating pain occurs occasionally.  Assessment and Plan Chronic low back pain Chronic low back pain with increased intensity and instability. Possible sciatic involvement. - Order MRI of the lumbar spine. - Obtain insurance approval for MRI.  Chronic hip pain Chronic hip pain persists with reduced intensity compared to back pain.  Follow-Up Instructions: No follow-ups on file.   Orders:  Orders Placed This Encounter  Procedures   MR Lumbar Spine w/o contrast   No orders of the defined types were placed in this encounter.     Procedures: No procedures performed   Clinical Data: No additional findings.   Subjective: Chief Complaint  Patient presents with   Lower Back - Pain    HPI  Review of Systems  Constitutional: Negative.   HENT: Negative.    Eyes: Negative.   Respiratory: Negative.    Cardiovascular: Negative.    Gastrointestinal: Negative.   Endocrine: Negative.   Genitourinary: Negative.   Skin: Negative.   Allergic/Immunologic: Negative.   Neurological: Negative.   Hematological: Negative.   Psychiatric/Behavioral: Negative.    All other systems reviewed and are negative.    Objective: Vital Signs: There were no vitals taken for this visit.  Physical Exam Vitals and nursing note reviewed.  Constitutional:      Appearance: He is well-developed.  HENT:     Head: Normocephalic and atraumatic.  Eyes:     Pupils: Pupils are equal, round, and reactive to light.  Pulmonary:     Effort: Pulmonary effort is normal.  Abdominal:     Palpations: Abdomen is soft.  Musculoskeletal:        General: Normal range of motion.     Cervical back: Neck supple.  Skin:    General: Skin is warm.  Neurological:     Mental Status: He is alert and oriented to person, place, and time.  Psychiatric:        Behavior: Behavior normal.        Thought Content: Thought content normal.        Judgment: Judgment normal.     Ortho Exam  Specialty Comments:  No specialty comments available.  Imaging: No results found.   PMFS History: Patient Active Problem List   Diagnosis Date Noted   Falls frequently  01/22/2024   Chronic SI joint pain 12/26/2022   Polycythemia 12/26/2022   Peyronie's disease 06/27/2022   History of colon polyps 06/27/2022   Hip pain 04/03/2022   Bladder neck obstruction 04/02/2022   Mucous cyst of joint 03/27/2022   Plantar wart 08/06/2021   Depression 12/27/2020   Low back pain 12/27/2020   OAB (overactive bladder) 04/08/2019   Trigeminal neuralgia 03/31/2019   Discoloration of skin of foot 12/10/2018   Paresthesia of foot 12/10/2018   Asthmatic bronchitis 10/09/2017   Hypertension 07/11/2017   Epididymitis 07/05/2016   Achilles tendinitis of left lower extremity 08/26/2015   Retrocalcaneal bursitis 08/26/2015   Heel spur 08/26/2015   Heel pain 08/25/2015    Erectile dysfunction 10/28/2014   Insomnia 06/05/2013   Grief 06/04/2013   Urinary urgency 04/07/2013   Obstructive sleep apnea 04/07/2012   Well adult exam 09/20/2011   Paresthesia 09/20/2011   Fatigue 11/25/2010   Occipital neuralgia of right side 04/20/2010   Otitis media 03/30/2010   Headache 03/30/2010   SHOULDER PAIN 10/25/2009   CHANGE IN BOWELS 10/06/2009   SEBACEOUS CYST, INFECTED 05/19/2009   Cervical pain (neck) 05/19/2009   PARESTHESIA 05/19/2009   Tobacco abuse 05/19/2009   OTHER CONGENITAL ANOMALY GALLBLADDER BDS&LIVER 12/16/2007   WEIGHT LOSS 12/02/2007   ORAL ULCER 11/21/2007   Neoplasm of uncertain behavior of skin 04/10/2007   ELBOW PAIN 04/10/2007   Hypogonadism in male 04/08/2007   GLUCOSE INTOLERANCE 04/08/2007   Dyslipidemia 04/08/2007   Attention deficit disorder 04/08/2007   SINUSITIS- ACUTE-NOS 04/08/2007   Allergic rhinitis 04/08/2007   Past Medical History:  Diagnosis Date   ADD (attention deficit disorder)    Allergic rhinitis    Allergy    Asthma    Cancer (HCC)    basal cell skin cancer    Cervical radiculopathy 2007   Right- Dr. Carles    Constipation 2011   Glucose intolerance (impaired glucose tolerance)    Heart murmur    Hyperlipidemia    Hypogonadism male    Occipital neuralgia 2011   Left   OSA (obstructive sleep apnea)    Sleep apnea    no cpap currently     Family History  Problem Relation Age of Onset   Colon cancer Mother 52   Cancer Mother 3       colon ca   Stroke Father    Heart disease Father        A fib   Hypertension Other    Coronary artery disease Neg Hx    Colon polyps Neg Hx    Esophageal cancer Neg Hx    Rectal cancer Neg Hx    Stomach cancer Neg Hx     Past Surgical History:  Procedure Laterality Date   COLONOSCOPY     POLYPECTOMY     UVULOPALATOPLASTY     s/p   Social History   Occupational History   Occupation: Retired  Tobacco Use   Smoking status: Some Days    Current packs/day:  0.10    Average packs/day: 0.1 packs/day for 20.0 years (2.0 ttl pk-yrs)    Types: E-cigarettes, Cigarettes   Smokeless tobacco: Never   Tobacco comments:    does vape    Vaping Use   Vaping status: Some Days   Substances: Nicotine, Flavoring, Nicotine-salt  Substance and Sexual Activity   Alcohol use: Not Currently   Drug use: No   Sexual activity: Yes

## 2024-03-24 ENCOUNTER — Encounter: Payer: Self-pay | Admitting: Radiology

## 2024-04-04 ENCOUNTER — Ambulatory Visit
Admission: RE | Admit: 2024-04-04 | Discharge: 2024-04-04 | Disposition: A | Discharge status: Home / Self Care | Source: Ambulatory Visit | Attending: Orthopaedic Surgery | Admitting: Orthopaedic Surgery

## 2024-04-04 DIAGNOSIS — M25552 Pain in left hip: Secondary | ICD-10-CM

## 2024-04-04 DIAGNOSIS — M160 Bilateral primary osteoarthritis of hip: Secondary | ICD-10-CM

## 2024-04-04 DIAGNOSIS — M25551 Pain in right hip: Secondary | ICD-10-CM

## 2024-04-09 ENCOUNTER — Ambulatory Visit (INDEPENDENT_AMBULATORY_CARE_PROVIDER_SITE_OTHER): Admitting: Orthopaedic Surgery

## 2024-04-09 DIAGNOSIS — M545 Low back pain, unspecified: Secondary | ICD-10-CM

## 2024-04-09 DIAGNOSIS — G8929 Other chronic pain: Secondary | ICD-10-CM | POA: Diagnosis not present

## 2024-04-09 MED ORDER — TRAMADOL HCL 50 MG PO TABS: 50.0000 mg | ORAL_TABLET | Freq: Every day | ORAL | 0 refills | Status: AC | PRN

## 2024-04-09 NOTE — Progress Notes (Signed)
 Office Visit Note   Patient: Victor Montes           Date of Birth: 12/27/1955           MRN: [id number] Visit Date: 04/09/2024              Requested by: Garald Karlynn GAILS, MD [address] [place],  [place] [address] PCP: Plotnikov, Karlynn GAILS, MD   Assessment & Plan: Visit Diagnoses:  1. Chronic bilateral low back pain without sciatica     Plan: History of Present Illness Victor Montes is a 68 year old male with spinal stenosis and degenerative disc disease who presents to review MRI.  He experiences significant back pain, exacerbated by activities such as lifting or walking, leading to altered posture. MRI reveals spinal stenosis and a pinched nerve at L3-4, with degenerative disc disease and arthritis at L4-5 and L5-S1.  He has completed four months of physical therapy and uses home equipment but continues to experience pain. He takes Relafen  (750 mg) up to three times daily and has previously used tramadol . He avoids opiates for pain management.  No weakness or paralysis in his legs.  Results RADIOLOGY Lumbar spine MRI: Spinal stenosis and nerve root compression at L3-4, disc degeneration and osteoarthritis at L4-5 and L5-S1.  Assessment and Plan Lumbar spinal stenosis with radiculopathy and degenerative disc disease MRI shows stenosis and radiculopathy at L3-4, degenerative disc disease at L4-5 and L5-S1. No weakness or paralysis. Focus on pain management. - Ordered epidural steroid injection at L3-4, L4-5, and L5-S1. - Coordinated with Dr. Eldonna for procedure. - Prescribed tramadol .  Chronic low back pain Pain persists despite Relafen . Prefers non-opioid options. - Prescribed tramadol .  Follow-Up Instructions: No follow-ups on file.   Orders:  No orders of the defined types were placed in this encounter.  Meds ordered this encounter  Medications   traMADol  (ULTRAM ) 50 MG tablet    Sig: Take 1-2 tablets (50-100 mg total) by mouth daily as needed.     Dispense:  20 tablet    Refill:  0      Procedures: No procedures performed   Clinical Data: No additional findings.   Subjective: Chief Complaint  Patient presents with   Lower Back - Pain    HPI  Review of Systems  Constitutional: Negative.   HENT: Negative.    Eyes: Negative.   Respiratory: Negative.    Cardiovascular: Negative.   Gastrointestinal: Negative.   Endocrine: Negative.   Genitourinary: Negative.   Skin: Negative.   Allergic/Immunologic: Negative.   Neurological: Negative.   Hematological: Negative.   Psychiatric/Behavioral: Negative.    All other systems reviewed and are negative.    Objective: Vital Signs: There were no vitals taken for this visit.  Physical Exam Vitals and nursing note reviewed.  Constitutional:      Appearance: He is well-developed.  HENT:     Head: Normocephalic and atraumatic.  Eyes:     Pupils: Pupils are equal, round, and reactive to light.  Pulmonary:     Effort: Pulmonary effort is normal.  Abdominal:     Palpations: Abdomen is soft.  Musculoskeletal:        General: Normal range of motion.     Cervical back: Neck supple.  Skin:    General: Skin is warm.  Neurological:     Mental Status: He is alert and oriented to person, place, and time.  Psychiatric:        Behavior: Behavior normal.  Thought Content: Thought content normal.        Judgment: Judgment normal.     Ortho Exam  Specialty Comments:  No specialty comments available.  Imaging: No results found.   PMFS History: Patient Active Problem List   Diagnosis Date Noted   Falls frequently 01/22/2024   Chronic SI joint pain 12/26/2022   Polycythemia 12/26/2022   Peyronie's disease 06/27/2022   History of colon polyps 06/27/2022   Hip pain 04/03/2022   Bladder neck obstruction 04/02/2022   Mucous cyst of joint 03/27/2022   Plantar wart 08/06/2021   Depression 12/27/2020   Low back pain 12/27/2020   OAB (overactive bladder)  04/08/2019   Trigeminal neuralgia 03/31/2019   Discoloration of skin of foot 12/10/2018   Paresthesia of foot 12/10/2018   Asthmatic bronchitis 10/09/2017   Hypertension 07/11/2017   Epididymitis 07/05/2016   Achilles tendinitis of left lower extremity 08/26/2015   Retrocalcaneal bursitis 08/26/2015   Heel spur 08/26/2015   Heel pain 08/25/2015   Erectile dysfunction 10/28/2014   Insomnia 06/05/2013   Grief 06/04/2013   Urinary urgency 04/07/2013   Obstructive sleep apnea 04/07/2012   Well adult exam 09/20/2011   Paresthesia 09/20/2011   Fatigue 11/25/2010   Occipital neuralgia of right side 04/20/2010   Otitis media 03/30/2010   Headache 03/30/2010   SHOULDER PAIN 10/25/2009   CHANGE IN BOWELS 10/06/2009   SEBACEOUS CYST, INFECTED 05/19/2009   Cervical pain (neck) 05/19/2009   PARESTHESIA 05/19/2009   Tobacco abuse 05/19/2009   OTHER CONGENITAL ANOMALY GALLBLADDER BDS&LIVER 12/16/2007   WEIGHT LOSS 12/02/2007   ORAL ULCER 11/21/2007   Neoplasm of uncertain behavior of skin 04/10/2007   ELBOW PAIN 04/10/2007   Hypogonadism in male 04/08/2007   GLUCOSE INTOLERANCE 04/08/2007   Dyslipidemia 04/08/2007   Attention deficit disorder 04/08/2007   SINUSITIS- ACUTE-NOS 04/08/2007   Allergic rhinitis 04/08/2007   Past Medical History:  Diagnosis Date   ADD (attention deficit disorder)    Allergic rhinitis    Allergy    Asthma    Cancer (HCC)    basal cell skin cancer    Cervical radiculopathy 2007   Right- Dr. Carles    Constipation 2011   Glucose intolerance (impaired glucose tolerance)    Heart murmur    Hyperlipidemia    Hypogonadism male    Occipital neuralgia 2011   Left   OSA (obstructive sleep apnea)    Sleep apnea    no cpap currently     Family History  Problem Relation Age of Onset   Colon cancer Mother 67   Cancer Mother 75       colon ca   Stroke Father    Heart disease Father        A fib   Hypertension Other    Coronary artery disease Neg Hx     Colon polyps Neg Hx    Esophageal cancer Neg Hx    Rectal cancer Neg Hx    Stomach cancer Neg Hx     Past Surgical History:  Procedure Laterality Date   COLONOSCOPY     POLYPECTOMY     UVULOPALATOPLASTY     s/p   Social History   Occupational History   Occupation: Retired  Tobacco Use   Smoking status: Some Days    Current packs/day: 0.10    Average packs/day: 0.1 packs/day for 20.0 years (2.0 ttl pk-yrs)    Types: E-cigarettes, Cigarettes   Smokeless tobacco: Never   Tobacco comments:  does vape    Vaping Use   Vaping status: Some Days   Substances: Nicotine, Flavoring, Nicotine-salt  Substance and Sexual Activity   Alcohol use: Not Currently   Drug use: No   Sexual activity: Yes

## 2024-04-22 ENCOUNTER — Encounter: Payer: Self-pay | Admitting: Internal Medicine

## 2024-04-22 ENCOUNTER — Ambulatory Visit: Admitting: Internal Medicine

## 2024-04-22 VITALS — BP 128/84 | HR 90 | Temp 98.2°F | Ht 78.0 in | Wt 237.2 lb

## 2024-04-22 DIAGNOSIS — N3281 Overactive bladder: Secondary | ICD-10-CM

## 2024-04-22 DIAGNOSIS — M25551 Pain in right hip: Secondary | ICD-10-CM

## 2024-04-22 DIAGNOSIS — I10 Essential (primary) hypertension: Secondary | ICD-10-CM | POA: Diagnosis not present

## 2024-04-22 DIAGNOSIS — E291 Testicular hypofunction: Secondary | ICD-10-CM

## 2024-04-22 DIAGNOSIS — M545 Low back pain, unspecified: Secondary | ICD-10-CM | POA: Diagnosis not present

## 2024-04-22 DIAGNOSIS — N320 Bladder-neck obstruction: Secondary | ICD-10-CM | POA: Diagnosis not present

## 2024-04-22 DIAGNOSIS — D751 Secondary polycythemia: Secondary | ICD-10-CM | POA: Diagnosis not present

## 2024-04-22 DIAGNOSIS — G8929 Other chronic pain: Secondary | ICD-10-CM

## 2024-04-22 DIAGNOSIS — M533 Sacrococcygeal disorders, not elsewhere classified: Secondary | ICD-10-CM

## 2024-04-22 DIAGNOSIS — M25552 Pain in left hip: Secondary | ICD-10-CM

## 2024-04-22 DIAGNOSIS — F909 Attention-deficit hyperactivity disorder, unspecified type: Secondary | ICD-10-CM

## 2024-04-22 MED ORDER — AMPHETAMINE-DEXTROAMPHETAMINE 10 MG PO TABS: 10.0000 mg | ORAL_TABLET | Freq: Two times a day (BID) | ORAL | 0 refills | Status: AC

## 2024-04-22 MED ORDER — TESTOSTERONE CYPIONATE 200 MG/ML IM SOLN: INTRAMUSCULAR | 1 refills | Status: DC

## 2024-04-22 NOTE — Progress Notes (Signed)
 Subjective:  Patient ID: Victor Montes, male    DOB: October 29, 1955  Age: 67 y.o. MRN: [id number]  CC: Medical Management of Chronic Issues (3 Month follow up)   HPI Victor Montes presents for ADD, hypogonadism, LBP - had an MRI - epidural inj was recommended for pinched nerve - R side (05/05/24)..   Outpatient Medications Prior to Visit  Medication Sig Dispense Refill   amphetamine -dextroamphetamine  (ADDERALL) 10 MG tablet Take 1 tablet (10 mg total) by mouth 2 (two) times daily. 60 tablet 0   aspirin 325 MG tablet Take 325 mg by mouth daily.     b complex vitamins tablet Take 1 tablet by mouth daily.     cetirizine (ZYRTEC) 10 MG tablet Take 10 mg by mouth daily.     cyclobenzaprine  (FLEXERIL ) 10 MG tablet Take 1 tablet (10 mg total) by mouth 3 (three) times daily as needed for muscle spasms. 30 tablet 1   MAGNESIUM PO Take 1 tablet by mouth daily.     montelukast  (SINGULAIR ) 10 MG tablet Take 1 tablet by mouth daily. 90 tablet 2   Multiple Vitamin (MULTIVITAMIN WITH MINERALS) TABS tablet Take 1 tablet by mouth daily.     nabumetone  (RELAFEN ) 750 MG tablet Take 1 tablet by mouth twice daily as needed for moderate pain. 180 tablet 0   predniSONE  (DELTASONE ) 10 MG tablet Prednisone  10 mg: take 4 tabs a day x 3 days; then 3 tabs a day x 4 days; then 2 tabs a day x 4 days, then 1 tab a day x 6 days, then stop. Take pc. 38 tablet 1   sertraline  (ZOLOFT ) 100 MG tablet Take 1 tablet (100 mg total) by mouth daily. 90 tablet 3   SYRINGE-NEEDLE, DISP, 3 ML (BD ECLIPSE SYRINGE) 25G X 1 3 ML MISC USE AS DIRECTED FOR IM INJECTION 50 each 11   tadalafil  (CIALIS ) 5 MG tablet Take 1 tablet by mouth daily. 90 tablet 1   traMADol  (ULTRAM ) 50 MG tablet Take 1-2 tablets (50-100 mg total) by mouth daily as needed. 20 tablet 0   amphetamine -dextroamphetamine  (ADDERALL) 10 MG tablet Take 1 tablet (10 mg total) by mouth 2 (two) times daily. 60 tablet 0   amphetamine -dextroamphetamine  (ADDERALL) 10 MG tablet Take  1 tablet (10 mg total) by mouth 2 (two) times daily. 60 tablet 0   testosterone  cypionate (DEPOTESTOSTERONE CYPIONATE) 200 MG/ML injection Inject 2 ml into the muscle every 2 weeks. 12 mL 1   No facility-administered medications prior to visit.    ROS: Review of Systems  Constitutional:  Negative for appetite change, fatigue and unexpected weight change.  HENT:  Negative for congestion, nosebleeds, sneezing, sore throat and trouble swallowing.   Eyes:  Negative for itching and visual disturbance.  Respiratory:  Negative for cough.   Cardiovascular:  Negative for chest pain, palpitations and leg swelling.  Gastrointestinal:  Negative for abdominal distention, blood in stool, diarrhea and nausea.  Genitourinary:  Negative for frequency and hematuria.  Musculoskeletal:  Positive for back pain and gait problem. Negative for joint swelling and neck pain.  Skin:  Negative for rash.  Neurological:  Negative for dizziness, tremors, speech difficulty and weakness.  Psychiatric/Behavioral:  Positive for decreased concentration. Negative for agitation, dysphoric mood and sleep disturbance. The patient is not nervous/anxious.     Objective:  BP 128/84   Pulse 90   Temp 98.2 F (36.8 C)   Ht 6' 6 (1.981 m)   Wt 237 lb 3.2 oz (107.6 kg)  SpO2 96%   BMI 27.41 kg/m   BP Readings from Last 3 Encounters:  04/22/24 128/84  01/22/24 (!) 138/50  10/23/23 118/70    Wt Readings from Last 3 Encounters:  04/22/24 237 lb 3.2 oz (107.6 kg)  02/22/24 236 lb (107 kg)  01/22/24 239 lb (108.4 kg)    Physical Exam Constitutional:      General: He is not in acute distress.    Appearance: Normal appearance. He is well-developed.     Comments: NAD  Eyes:     Conjunctiva/sclera: Conjunctivae normal.     Pupils: Pupils are equal, round, and reactive to light.  Neck:     Thyroid : No thyromegaly.     Vascular: No JVD.  Cardiovascular:     Rate and Rhythm: Normal rate and regular rhythm.     Heart  sounds: Normal heart sounds. No murmur heard.    No friction rub. No gallop.  Pulmonary:     Effort: Pulmonary effort is normal. No respiratory distress.     Breath sounds: Normal breath sounds. No wheezing or rales.  Chest:     Chest wall: No tenderness.  Abdominal:     General: Bowel sounds are normal. There is no distension.     Palpations: Abdomen is soft. There is no mass.     Tenderness: There is abdominal tenderness. There is no guarding or rebound.  Musculoskeletal:        General: No tenderness. Normal range of motion.     Cervical back: Normal range of motion.     Right lower leg: No edema.     Left lower leg: No edema.  Lymphadenopathy:     Cervical: No cervical adenopathy.  Skin:    General: Skin is warm and dry.     Findings: No rash.  Neurological:     Mental Status: He is alert and oriented to person, place, and time.     Cranial Nerves: No cranial nerve deficit.     Motor: No abnormal muscle tone.     Coordination: Coordination normal.     Gait: Gait normal.     Deep Tendon Reflexes: Reflexes are normal and symmetric. Reflexes normal.  Psychiatric:        Behavior: Behavior normal.        Thought Content: Thought content normal.        Judgment: Judgment normal.     Lab Results  Component Value Date   WBC 10.0 01/03/2024   HGB 17.6 (H) 01/03/2024   HCT 53.4 (H) 01/03/2024   PLT 431.0 (H) 01/03/2024   GLUCOSE 87 01/03/2024   CHOL 231 (H) 01/03/2024   TRIG 83.0 01/03/2024   HDL 48.40 01/03/2024   LDLDIRECT 137.0 03/27/2022   LDLCALC 166 (H) 01/03/2024   ALT 28 01/03/2024   AST 22 01/03/2024   NA 139 01/03/2024   K 4.4 01/03/2024   CL 98 01/03/2024   CREATININE 0.95 01/03/2024   BUN 12 01/03/2024   CO2 28 01/03/2024   TSH 1.02 01/03/2024   PSA 2.82 01/03/2024   HGBA1C 5.6 07/11/2017    MR Lumbar Spine w/o contrast Result Date: 04/05/2024 CLINICAL DATA:  Initial evaluation for chronic lower back pain with bilateral hip and buttock pain with  numbness. EXAM: MRI LUMBAR SPINE WITHOUT CONTRAST TECHNIQUE: Multiplanar, multisequence MR imaging of the lumbar spine was performed. No intravenous contrast was administered. COMPARISON:  None Available. FINDINGS: Segmentation: Standard. Lowest well-formed disc space labeled the L5-S1 level. Alignment: Straightening of  the normal lumbar lordosis. Underlying mild dextroscoliosis. Trace anterolisthesis of L3 on L4. Vertebrae: Vertebral body height maintained without acute or chronic fracture. Decreased T1 signal intensity within the visualized bone marrow, nonspecific, but most commonly related to anemia, smoking or obesity. No worrisome osseous lesions. Mild reactive marrow edema present about the right L3-4 facet due to facet arthritis (series 7, image 5). Conus medullaris and cauda equina: Conus extends to the L1 level. Conus medullaris within normal limits. Nerve roots of the cauda equina are somewhat irregular at the level of L3-4 due to stenosis. Paraspinal and other soft tissues: Unremarkable. Disc levels: L1-2: Minimal annular disc bulge. Mild to moderate left facet hypertrophy. No spinal stenosis. Foramina remain patent. L2-3: Mild intervertebral disc space narrowing with disc desiccation diffuse disc bulge. Mild bilateral facet hypertrophy. No spinal stenosis. Foramina remain patent. L3-4: Degenerative disc space narrowing with disc desiccation and diffuse disc bulge, slightly asymmetric to the right. Moderate to severe right worse than left facet hypertrophy with associated small joint effusions, greater on the right. Superimposed 10 mm synovial cyst present at the medial aspect of the right L3-4 facet (series 7, image 8).). Resultant severe multifactorial spinal stenosis. Moderate right worse than left L3 foraminal narrowing. L4-5: Disc desiccation with mild disc bulge. Moderate right with mild left facet hypertrophy. Borderline mild narrowing of the lateral recesses bilaterally. Central canal remains  patent. Mild right L4 foraminal stenosis. Left neural foramen remains patent. L5-S1: Disc desiccation with mild diffuse disc bulge. Mild facet hypertrophy. No significant spinal stenosis. Mild bilateral L5 foraminal narrowing. IMPRESSION: 1. Multifactorial degenerative changes at L3-4 with resultant severe spinal stenosis, with moderate right worse than left L3 foraminal narrowing. 2. Mild disc bulging with facet hypertrophy at L4-5 and L5-S1 with resultant mild right L4 and bilateral L5 foraminal stenosis. 3. Moderate to severe bilateral facet hypertrophy at L3-4 with associated mild reactive marrow edema on the right. Finding could serve as a source for lower back pain. Electronically Signed   By: Morene Hoard M.D.   On: 04/05/2024 04:33    Assessment & Plan:   Problem List Items Addressed This Visit     ADD (attention deficit disorder)   Continue on Adderall.   Potential benefits of a long term stimulants use as well as potential risks  and complications were explained to the patient and were aknowledged.      Relevant Orders   Comprehensive metabolic panel with GFR   T4, free   Testosterone    TSH   PSA   CBC with Differential/Platelet   Lipid panel   Urinalysis   Bladder neck obstruction   Relevant Orders   Comprehensive metabolic panel with GFR   PSA   CBC with Differential/Platelet   Chronic SI joint pain   Relevant Medications   amphetamine -dextroamphetamine  (ADDERALL) 10 MG tablet   Hip pain   Relevant Medications   amphetamine -dextroamphetamine  (ADDERALL) 10 MG tablet   Hypertension   BP Readings from Last 3 Encounters:  04/22/24 128/84  01/22/24 (!) 138/50  10/23/23 118/70         Relevant Orders   Comprehensive metabolic panel with GFR   T4, free   Testosterone    TSH   PSA   CBC with Differential/Platelet   Lipid panel   Urinalysis   Hypogonadism in male   Donating blood - next one is in April CBC, LFT On testosterone  Rx since 2010 Potential  benefits of a long term testosterone  use as well as potential risks  and complications were explained to the patient and were aknowledged.      Relevant Orders   Comprehensive metabolic panel with GFR   T4, free   Testosterone    TSH   PSA   CBC with Differential/Platelet   Lipid panel   Urinalysis   Low back pain - Primary   Victor Montes fell a few times again Seeing Dr Jerri. LBP - had an MRI - epidural inj was recommended for pinched nerve - R side (05/05/24).      OAB (overactive bladder)    Stable Seeing Dr Nieves      Polycythemia      Meds ordered this encounter  Medications   amphetamine -dextroamphetamine  (ADDERALL) 10 MG tablet    Sig: Take 1 tablet (10 mg total) by mouth 2 (two) times daily.    Dispense:  60 tablet    Refill:  0    Please fill on or after 05/19/2024   amphetamine -dextroamphetamine  (ADDERALL) 10 MG tablet    Sig: Take 1 tablet (10 mg total) by mouth 2 (two) times daily.    Dispense:  60 tablet    Refill:  0    Please fill on or after 06/18/2024   testosterone  cypionate (DEPOTESTOSTERONE CYPIONATE) 200 MG/ML injection    Sig: Inject 2 ml into the muscle every 2 weeks.    Dispense:  12 mL    Refill:  1      Follow-up: Return in about 3 months (around 07/21/2024) for a follow-up visit.  Marolyn Noel, MD

## 2024-04-22 NOTE — Assessment & Plan Note (Signed)
 Donating blood - next one is in April CBC, LFT On testosterone  Rx since 2010 Potential benefits of a long term testosterone  use as well as potential risks  and complications were explained to the patient and were aknowledged.

## 2024-04-22 NOTE — Assessment & Plan Note (Addendum)
 Victor Montes fell a few times again Seeing Dr Jerri. LBP - had an MRI - epidural inj was recommended for pinched nerve - R side (05/05/24).

## 2024-04-22 NOTE — Assessment & Plan Note (Signed)
 Stable Seeing Dr Nieves

## 2024-04-22 NOTE — Assessment & Plan Note (Signed)
Continue on Adderall.   Potential benefits of a long term stimulants use as well as potential risks  and complications were explained to the patient and were aknowledged.

## 2024-04-22 NOTE — Assessment & Plan Note (Signed)
 BP Readings from Last 3 Encounters:  04/22/24 128/84  01/22/24 (!) 138/50  10/23/23 118/70

## 2024-05-05 ENCOUNTER — Ambulatory Visit: Admitting: Physical Medicine and Rehabilitation

## 2024-05-05 ENCOUNTER — Other Ambulatory Visit: Payer: Self-pay

## 2024-05-05 VITALS — BP 151/80 | HR 80

## 2024-05-05 DIAGNOSIS — M5416 Radiculopathy, lumbar region: Secondary | ICD-10-CM

## 2024-05-05 MED ORDER — METHYLPREDNISOLONE ACETATE 40 MG/ML IJ SUSP
40.0000 mg | Freq: Once | INTRAMUSCULAR | Status: AC
  Administered 2024-05-05: 13:00:00 40 mg

## 2024-05-05 NOTE — Progress Notes (Signed)
 Victor Montes - 68 y.o. male MRN [id number]  Date of birth: 1956-01-27  Office Visit Note: Visit Date: 05/05/2024 PCP: Garald Karlynn GAILS, MD Referred by: Garald Karlynn GAILS, MD  Subjective: Chief Complaint  Patient presents with   Lower Back - Pain   HPI:  Victor Montes is a 68 y.o. male who comes in today at the request of Dr. Ozell Cummins for planned Bilateral L3-4 Lumbar Transforaminal epidural steroid injection with fluoroscopic guidance.  The patient has failed conservative care including home exercise, medications, time and activity modification.  This injection will be diagnostic and hopefully therapeutic.  Please see requesting physician notes for further details and justification.  Consider facet injection aspiration depending on outcome. Should consider possible decompression.   ROS Otherwise per HPI.  Assessment & Plan: Visit Diagnoses:    ICD-10-CM   1. Lumbar radiculopathy  M54.16 XR C-ARM NO REPORT    Epidural Steroid injection    methylPREDNISolone  acetate (DEPO-MEDROL ) injection 40 mg      Plan: No additional findings.   Meds & Orders:  Meds ordered this encounter  Medications   methylPREDNISolone  acetate (DEPO-MEDROL ) injection 40 mg    Orders Placed This Encounter  Procedures   XR C-ARM NO REPORT   Epidural Steroid injection    Follow-up: Return for visit to requesting provider as needed.   Procedures: No procedures performed  Lumbosacral Transforaminal Epidural Steroid Injection - Sub-Pedicular Approach with Fluoroscopic Guidance  Patient: Victor Montes      Date of Birth: 1955-07-13 MRN: [id number] PCP: Garald Karlynn GAILS, MD      Visit Date: 05/05/2024   Universal Protocol:    Date/Time: 05/05/2024  Consent Given By: the patient  Position: PRONE  Additional Comments: Vital signs were monitored before and after the procedure. Patient was prepped and draped in the usual sterile fashion. The correct patient, procedure, and site was  verified.   Injection Procedure Details:   Procedure diagnoses: Lumbar radiculopathy [M54.16]    Meds Administered:  Meds ordered this encounter  Medications   methylPREDNISolone  acetate (DEPO-MEDROL ) injection 40 mg    Laterality: Bilateral  Location/Site: L3  Needle:5.0 in., 22 ga.  Short bevel or Quincke spinal needle  Needle Placement: Transforaminal  Findings:    -Comments: Excellent flow of contrast along the nerve, nerve root and into the epidural space.  Procedure Details: After squaring off the end-plates to get a true AP view, the C-arm was positioned so that an oblique view of the foramen as noted above was visualized. The target area is just inferior to the nose of the scotty dog or sub pedicular. The soft tissues overlying this structure were infiltrated with 2-3 ml. of 1% Lidocaine  without Epinephrine.  The spinal needle was inserted toward the target using a trajectory view along the fluoroscope beam.  Under AP and lateral visualization, the needle was advanced so it did not puncture dura and was located close the 6 O'Clock position of the pedical in AP tracterory. Biplanar projections were used to confirm position. Aspiration was confirmed to be negative for CSF and/or blood. A 1-2 ml. volume of Isovue-250 was injected and flow of contrast was noted at each level. Radiographs were obtained for documentation purposes.   After attaining the desired flow of contrast documented above, a 0.5 to 1.0 ml test dose of 0.25% Marcaine was injected into each respective transforaminal space.  The patient was observed for 90 seconds post injection.  After no sensory deficits were reported, and normal lower extremity  motor function was noted,   the above injectate was administered so that equal amounts of the injectate were placed at each foramen (level) into the transforaminal epidural space.   Additional Comments:  The patient tolerated the procedure well Dressing: 2 x 2  sterile gauze and Band-Aid    Post-procedure details: Patient was observed during the procedure. Post-procedure instructions were reviewed.  Patient left the clinic in stable condition.    Clinical History: CLINICAL DATA:  Initial evaluation for chronic lower back pain with bilateral hip and buttock pain with numbness.   EXAM: MRI LUMBAR SPINE WITHOUT CONTRAST   TECHNIQUE: Multiplanar, multisequence MR imaging of the lumbar spine was performed. No intravenous contrast was administered.   COMPARISON:  None Available.   FINDINGS: Segmentation: Standard. Lowest well-formed disc space labeled the L5-S1 level.   Alignment: Straightening of the normal lumbar lordosis. Underlying mild dextroscoliosis. Trace anterolisthesis of L3 on L4.   Vertebrae: Vertebral body height maintained without acute or chronic fracture. Decreased T1 signal intensity within the visualized bone marrow, nonspecific, but most commonly related to anemia, smoking or obesity. No worrisome osseous lesions. Mild reactive marrow edema present about the right L3-4 facet due to facet arthritis (series 7, image 5).   Conus medullaris and cauda equina: Conus extends to the L1 level. Conus medullaris within normal limits. Nerve roots of the cauda equina are somewhat irregular at the level of L3-4 due to stenosis.   Paraspinal and other soft tissues: Unremarkable.   Disc levels:   L1-2: Minimal annular disc bulge. Mild to moderate left facet hypertrophy. No spinal stenosis. Foramina remain patent.   L2-3: Mild intervertebral disc space narrowing with disc desiccation diffuse disc bulge. Mild bilateral facet hypertrophy. No spinal stenosis. Foramina remain patent.   L3-4: Degenerative disc space narrowing with disc desiccation and diffuse disc bulge, slightly asymmetric to the right. Moderate to severe right worse than left facet hypertrophy with associated small joint effusions, greater on the right.  Superimposed 10 mm synovial cyst present at the medial aspect of the right L3-4 facet (series 7, image 8).). Resultant severe multifactorial spinal stenosis. Moderate right worse than left L3 foraminal narrowing.   L4-5: Disc desiccation with mild disc bulge. Moderate right with mild left facet hypertrophy. Borderline mild narrowing of the lateral recesses bilaterally. Central canal remains patent. Mild right L4 foraminal stenosis. Left neural foramen remains patent.   L5-S1: Disc desiccation with mild diffuse disc bulge. Mild facet hypertrophy. No significant spinal stenosis. Mild bilateral L5 foraminal narrowing.   IMPRESSION: 1. Multifactorial degenerative changes at L3-4 with resultant severe spinal stenosis, with moderate right worse than left L3 foraminal narrowing. 2. Mild disc bulging with facet hypertrophy at L4-5 and L5-S1 with resultant mild right L4 and bilateral L5 foraminal stenosis. 3. Moderate to severe bilateral facet hypertrophy at L3-4 with associated mild reactive marrow edema on the right. Finding could serve as a source for lower back pain.     Electronically Signed   By: Morene Hoard M.D.   On: 04/05/2024 04:33     Objective:  VS:  HT:    WT:   BMI:     BP:(!) 151/80  HR:80bpm  TEMP: ( )  RESP:  Physical Exam   Imaging: No results found.

## 2024-05-05 NOTE — Progress Notes (Signed)
 Pain Scale   Average Pain 3 Patient advising he has chronic lower back pain that increase when standing and is constant        +Driver, -BT, -Dye Allergies.

## 2024-05-05 NOTE — Procedures (Signed)
 Lumbosacral Transforaminal Epidural Steroid Injection - Sub-Pedicular Approach with Fluoroscopic Guidance  Patient: Victor Montes      Date of Birth: [birth year] MRN: [id number] PCP: Garald Karlynn GAILS, MD      Visit Date: 05/05/2024   Universal Protocol:    Date/Time: 05/05/2024  Consent Given By: the patient  Position: PRONE  Additional Comments: Vital signs were monitored before and after the procedure. Patient was prepped and draped in the usual sterile fashion. The correct patient, procedure, and site was verified.   Injection Procedure Details:   Procedure diagnoses: Lumbar radiculopathy [M54.16]    Meds Administered:  Meds ordered this encounter  Medications   methylPREDNISolone  acetate (DEPO-MEDROL ) injection 40 mg    Laterality: Bilateral  Location/Site: L3  Needle:5.0 in., 22 ga.  Short bevel or Quincke spinal needle  Needle Placement: Transforaminal  Findings:    -Comments: Excellent flow of contrast along the nerve, nerve root and into the epidural space.  Procedure Details: After squaring off the end-plates to get a true AP view, the C-arm was positioned so that an oblique view of the foramen as noted above was visualized. The target area is just inferior to the nose of the scotty dog or sub pedicular. The soft tissues overlying this structure were infiltrated with 2-3 ml. of 1% Lidocaine  without Epinephrine.  The spinal needle was inserted toward the target using a trajectory view along the fluoroscope beam.  Under AP and lateral visualization, the needle was advanced so it did not puncture dura and was located close the 6 O'Clock position of the pedical in AP tracterory. Biplanar projections were used to confirm position. Aspiration was confirmed to be negative for CSF and/or blood. A 1-2 ml. volume of Isovue-250 was injected and flow of contrast was noted at each level. Radiographs were obtained for documentation purposes.   After attaining the  desired flow of contrast documented above, a 0.5 to 1.0 ml test dose of 0.25% Marcaine was injected into each respective transforaminal space.  The patient was observed for 90 seconds post injection.  After no sensory deficits were reported, and normal lower extremity motor function was noted,   the above injectate was administered so that equal amounts of the injectate were placed at each foramen (level) into the transforaminal epidural space.   Additional Comments:  The patient tolerated the procedure well Dressing: 2 x 2 sterile gauze and Band-Aid    Post-procedure details: Patient was observed during the procedure. Post-procedure instructions were reviewed.  Patient left the clinic in stable condition.

## 2024-05-16 ENCOUNTER — Other Ambulatory Visit: Payer: Self-pay | Admitting: Internal Medicine

## 2024-05-20 ENCOUNTER — Encounter: Payer: Self-pay | Admitting: Internal Medicine

## 2024-05-24 ENCOUNTER — Other Ambulatory Visit: Payer: Self-pay | Admitting: Internal Medicine

## 2024-05-26 ENCOUNTER — Encounter: Payer: Self-pay | Admitting: Orthopaedic Surgery

## 2024-05-26 NOTE — Telephone Encounter (Signed)
 I would have him f/u with [facility] based on [facility]

## 2024-05-27 NOTE — Telephone Encounter (Signed)
 We can make a referral to dr. Georgina or neurosurgery if he would like?  I did not mention going back to [facility]?

## 2024-05-28 ENCOUNTER — Other Ambulatory Visit: Payer: Self-pay | Admitting: Internal Medicine

## 2024-05-28 MED ORDER — TESTOSTERONE CYPIONATE 200 MG/ML IM SOLN: INTRAMUSCULAR | 1 refills | Status: AC

## 2024-06-30 ENCOUNTER — Ambulatory Visit: Admitting: Orthopedic Surgery

## 2024-07-07 ENCOUNTER — Ambulatory Visit: Admitting: Orthopedic Surgery

## 2024-07-22 ENCOUNTER — Ambulatory Visit: Admitting: Internal Medicine

## 2025-02-24 ENCOUNTER — Encounter: Admitting: Internal Medicine

## 2025-02-24 ENCOUNTER — Ambulatory Visit
